# Patient Record
Sex: Female | Born: 1940
Health system: Southern US, Community
[De-identification: ages and names within clinical notes are randomized; demographics above are authoritative.]

## PROBLEM LIST (undated history)

## (undated) DIAGNOSIS — I6529 Occlusion and stenosis of unspecified carotid artery: Secondary | ICD-10-CM

## (undated) DIAGNOSIS — K5792 Diverticulitis of intestine, part unspecified, without perforation or abscess without bleeding: Secondary | ICD-10-CM

## (undated) DIAGNOSIS — R0600 Dyspnea, unspecified: Secondary | ICD-10-CM

## (undated) DIAGNOSIS — E785 Hyperlipidemia, unspecified: Secondary | ICD-10-CM

## (undated) DIAGNOSIS — M545 Low back pain, unspecified: Secondary | ICD-10-CM

## (undated) DIAGNOSIS — F410 Panic disorder [episodic paroxysmal anxiety] without agoraphobia: Secondary | ICD-10-CM

## (undated) DIAGNOSIS — D649 Anemia, unspecified: Secondary | ICD-10-CM

## (undated) DIAGNOSIS — K219 Gastro-esophageal reflux disease without esophagitis: Secondary | ICD-10-CM

## (undated) DIAGNOSIS — G8929 Other chronic pain: Secondary | ICD-10-CM

## (undated) DIAGNOSIS — R011 Cardiac murmur, unspecified: Secondary | ICD-10-CM

## (undated) DIAGNOSIS — N189 Chronic kidney disease, unspecified: Secondary | ICD-10-CM

## (undated) DIAGNOSIS — I10 Essential (primary) hypertension: Secondary | ICD-10-CM

## (undated) DIAGNOSIS — I639 Cerebral infarction, unspecified: Secondary | ICD-10-CM

## (undated) DIAGNOSIS — M1990 Unspecified osteoarthritis, unspecified site: Secondary | ICD-10-CM

## (undated) DIAGNOSIS — T4145XA Adverse effect of unspecified anesthetic, initial encounter: Secondary | ICD-10-CM

## (undated) DIAGNOSIS — J302 Other seasonal allergic rhinitis: Secondary | ICD-10-CM

## (undated) DIAGNOSIS — T8859XA Other complications of anesthesia, initial encounter: Secondary | ICD-10-CM

## (undated) DIAGNOSIS — R32 Unspecified urinary incontinence: Secondary | ICD-10-CM

## (undated) HISTORY — DX: Cerebral infarction, unspecified: I63.9

## (undated) HISTORY — DX: Anemia, unspecified: D64.9

## (undated) HISTORY — DX: Occlusion and stenosis of unspecified carotid artery: I65.29

## (undated) HISTORY — DX: Gastro-esophageal reflux disease without esophagitis: K21.9

## (undated) HISTORY — DX: Other seasonal allergic rhinitis: J30.2

## (undated) HISTORY — DX: Low back pain, unspecified: M54.50

## (undated) HISTORY — DX: Panic disorder (episodic paroxysmal anxiety): F41.0

## (undated) HISTORY — DX: Diverticulitis of intestine, part unspecified, without perforation or abscess without bleeding: K57.92

## (undated) HISTORY — DX: Other chronic pain: G89.29

## (undated) HISTORY — PX: PARTIAL HYSTERECTOMY: SHX80

## (undated) HISTORY — DX: Low back pain: M54.5

## (undated) HISTORY — DX: Cardiac murmur, unspecified: R01.1

## (undated) HISTORY — DX: Essential (primary) hypertension: I10

## (undated) HISTORY — DX: Hyperlipidemia, unspecified: E78.5

## (undated) HISTORY — PX: KNEE ARTHROSCOPY: SUR90

## (undated) HISTORY — PX: OVARIAN CYST SURGERY: SHX726

---
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
## 1971-07-12 HISTORY — PX: ABDOMINAL HYSTERECTOMY: SHX81

## 1999-12-02 ENCOUNTER — Encounter: Payer: Self-pay | Admitting: Family Medicine

## 1999-12-02 ENCOUNTER — Ambulatory Visit (HOSPITAL_COMMUNITY): Admission: RE | Admit: 1999-12-02 | Discharge: 1999-12-02 | Payer: Self-pay | Admitting: Family Medicine

## 2001-07-11 HISTORY — PX: COLECTOMY: SHX59

## 2001-11-30 ENCOUNTER — Ambulatory Visit (HOSPITAL_COMMUNITY): Admission: RE | Admit: 2001-11-30 | Discharge: 2001-11-30 | Payer: Self-pay | Admitting: Gastroenterology

## 2002-04-30 ENCOUNTER — Inpatient Hospital Stay (HOSPITAL_COMMUNITY): Admission: RE | Admit: 2002-04-30 | Discharge: 2002-05-08 | Payer: Self-pay | Admitting: *Deleted

## 2002-05-01 ENCOUNTER — Encounter: Payer: Self-pay | Admitting: Internal Medicine

## 2002-05-03 ENCOUNTER — Encounter: Payer: Self-pay | Admitting: Internal Medicine

## 2002-06-28 ENCOUNTER — Encounter: Payer: Self-pay | Admitting: General Surgery

## 2002-07-09 ENCOUNTER — Inpatient Hospital Stay (HOSPITAL_COMMUNITY): Admission: RE | Admit: 2002-07-09 | Discharge: 2002-07-16 | Payer: Self-pay | Admitting: General Surgery

## 2002-07-10 ENCOUNTER — Encounter (INDEPENDENT_AMBULATORY_CARE_PROVIDER_SITE_OTHER): Payer: Self-pay | Admitting: *Deleted

## 2005-04-12 ENCOUNTER — Ambulatory Visit: Payer: Self-pay | Admitting: General Practice

## 2005-05-14 ENCOUNTER — Ambulatory Visit (HOSPITAL_COMMUNITY): Admission: RE | Admit: 2005-05-14 | Discharge: 2005-05-14 | Payer: Self-pay | Admitting: *Deleted

## 2006-04-26 ENCOUNTER — Ambulatory Visit: Payer: Self-pay | Admitting: General Practice

## 2008-04-15 ENCOUNTER — Other Ambulatory Visit: Admission: RE | Admit: 2008-04-15 | Discharge: 2008-04-15 | Payer: Self-pay | Admitting: Family Medicine

## 2010-02-26 ENCOUNTER — Ambulatory Visit: Payer: Self-pay | Admitting: Vascular Surgery

## 2010-04-28 ENCOUNTER — Encounter: Admission: RE | Admit: 2010-04-28 | Discharge: 2010-04-28 | Payer: Self-pay | Admitting: Diagnostic Neuroimaging

## 2010-08-27 ENCOUNTER — Other Ambulatory Visit (INDEPENDENT_AMBULATORY_CARE_PROVIDER_SITE_OTHER): Payer: Medicare Other

## 2010-08-27 ENCOUNTER — Ambulatory Visit (INDEPENDENT_AMBULATORY_CARE_PROVIDER_SITE_OTHER): Payer: Medicare Other | Admitting: Vascular Surgery

## 2010-08-27 DIAGNOSIS — I6529 Occlusion and stenosis of unspecified carotid artery: Secondary | ICD-10-CM

## 2010-08-27 NOTE — Assessment & Plan Note (Signed)
OFFICE VISIT  Sophia Suarez, Sophia Suarez DOB:  Apr 05, 1941                                       08/27/2010 ZOXWR#:[id number]  This is an established patient followup.  HISTORY OF PRESENT ILLNESS:  This is a 70 year old female who presented with chief complaint of dizziness and intermittent left-sided headache. I saw her last in August of 2011.  Since then she has been seen by a neurologist and has been diagnosed with previous strokes.  There was some mention of possible benign positional vertigo.  However, no physical therapy for such has been undertaken.  At this point she continues to have vertigo both horizontal and also vertical vertigo at this point.  She denies any stroke or TIA symptomatology, specifically no amaurosis fugax, no monocular blindness, no motor weakness or facial droop or hemiplegia.  Also has had no episodes of any receptive or expressive aphasia.  Her past medical history, past surgical history, social history, medications, allergies and review of systems are all unchanged today.  PHYSICAL EXAMINATION:  Vital signs:  Her blood pressure was 142/84 in the right arm and 137/81 on the left arm, heart rate 78, respirations were 12, satting 98% on room air.  General:  Well-developed, well- nourished, no apparent distress.  HEENT:  Her head was normocephalic, atraumatic.  Pupils were equal, round, reactive to light.  Extraocular movements were intact.  The oropharynx had some mild erythema without any exudate.  Nares without any drainage.  Neck:  Supple without any nuchal rigidity.  Pulmonary:  Clear to auscultation bilaterally.  No rales, rhonchi or wheezing.  Heart:  Regular rate and rhythm.  Normal S1- S2.  No murmurs, rubs, thrills or gallops.  Vascular:  She had easily palpable pulses in the upper extremity and weakly palpable pedal pulses. Pulses can be auscultated in the carotids but there are no bruits per se.  Abdomen:  Obese, soft,  nontender, nondistended.  No guarding, no rebound, no hepatosplenomegaly.  Musculoskeletal:  Motor strength was 5/5 throughout.  No obvious cyanosis, ulcerations or any ischemic changes in any extremity.  Neuro:  Cranial nerves II-XII were intact. On eye examination there were some saccades suggestive of both horizontal and also vertical nystagmus with positional changes.  Her sensation is grossly intact.  Motor strength was equal throughout. However, I do not have the facilities to do a full Hallpike maneuver but just with positional changes she has findings that are highly suggestive of such.  Skin:  No obvious ulcers or rashes.  Lymphatics:  No cervical, axillary or inguinal lymphadenopathy.  Noninvasive vascular imaging:  She had bilateral carotid duplexes completed.  The right side the velocities were consistent with 40% to 59% stenosis, on the left 1% to 39% stenosis.  The vertebral arteries are antegrade bilaterally.  MEDICAL DECISION MAKING:  This is a 70 year old female who presents with routine followup.  She continues to have symptomatology which are highly concerning for possible benign positional vertigo.  It appears that she may not have had a full workup of such and I would suggest possible referral to a second opinion neurologist to evaluate for this as this patient even in transfers up to the examination table and also to the supine position became quite symptomatic with position changes.  I think physical therapy for BPPV would benefit her.  Additionally, in regards to her bilateral carotid stenosis  her stenoses are limited enough that she only needs annual surveillance at this point.  She needs to continue maximal medical management at this point to help arrest progression of her atherosclerotic disease.  She is already on a good regimen of aspirin, a statin and losartan for pressure control.  Due to the expertise my vascular lab has with these carotid studies I  would recommend we continue annual surveillance within our lab and also to maintain consistency in terms of the velocity criteria.  I appreciate being given the opportunity to participate in this patient's care.  Thank you very much.    Fransisco Hertz, MD Electronically Signed  BLC/MEDQ  D:  08/27/2010  T:  08/27/2010  Job:  [id number]  cc:   Emeterio Reeve, MD

## 2010-09-27 NOTE — Procedures (Unsigned)
CAROTID DUPLEX EXAM  INDICATION:  Carotid disease.  HISTORY: Diabetes:  Yes. Cardiac:  No. Hypertension:  Yes. Smoking:  No. Previous Surgery:  No. CV History:  Vertigo. Amaurosis Fugax No, Paresthesias No, Hemiparesis No                                      RIGHT             LEFT Brachial systolic pressure:         152               155 Brachial Doppler waveforms:         Normal            Normal Vertebral direction of flow:        Antegrade         Antegrade DUPLEX VELOCITIES (cm/sec) CCA peak systolic                   93                94 ECA peak systolic                   149               135 ICA peak systolic                   136               85 ICA end diastolic                   41                25 PLAQUE MORPHOLOGY:                  Heterogeneous     Heterogeneous PLAQUE AMOUNT:                      Moderate          Minimal PLAQUE LOCATION:                    CCA, ICA, bifurcation               ICA  IMPRESSION: 1. Right internal carotid artery velocity suggests 40% to 59%     stenosis. 2. Left internal carotid artery velocity suggests 1% to 39% stenosis. 3. Antegrade vertebral arteries bilaterally.  ___________________________________________ Fransisco Hertz, MD  EM/MEDQ  D:  08/27/2010  T:  08/27/2010  Job:  [id number]

## 2010-11-23 NOTE — Assessment & Plan Note (Signed)
OFFICE VISIT   Sophia Suarez, Sophia Suarez  DOB:  07-25-1940                                       02/26/2010  ZOXWR#:[id number]   This is an 70 year old female that presents with chief complaint of  dizziness and intermittent left-sided headache.  She notes onset of her  symptomatology between 4-6 months prior.  She is not exactly certain,  but since that point she has noticed increased frequency of dizziness  and no change, however, in the severity of her left temporal headaches.  There is no association necessarily between her dizziness and her  temporal headaches.  She further clarifies that by dizziness she  actually means vertigo, that is that the room appears to spin for her.  This has been increasing in severity despite use of meclizine.  She does  not have any previous TIA symptomatology, including amaurosis fugax, or  any motor or speech abnormalities or any difficulties with her  cognition.  Denies any cardiac symptoms during this time.  She is in the  process of evaluation by cardiology, I was told and possibly neurology.  She does note that her vertigo is somewhat positional and specifically  with position change.  Primarily she has, she says a left  counterclockwise vertigo but occasionally she also has clockwise vertigo  and a vertically oriented vertigo.  In the process of this patient's  workup she had a duplex ultrasound that was completed in November of  last year which at that point demonstrated a 50%-69% stenosis of the  right internal carotid artery and a less than 50% stenosis in the left  internal carotid artery.   PAST MEDICAL HISTORY:  In this patient included diabetes, seasonal  allergic rhinitis, gastroesophageal reflux disease, lumbago,  hypercholesterolemia, history of heart murmur, hypertension, asthma,  panic attacks, diverticulosis though the patient mentions  diverticulitis.   PAST SURGICAL HISTORY:  #1.  Appendectomy.  #2.  Ovarian cystectomy.  #3.  Partial hysterectomy for fibroids.  #4.  She has had some type of colectomy, the exact extent she is not  certain of for her diverticulitis.   SOCIAL HISTORY:  Notes no previous smoking.  Social alcohol use without  excessive drinking.  No illicit drug use noted.  The patient is  currently retired and has two children.   FAMILY MEDICAL HISTORY:  Mother had coronary artery disease and required  a CABG.  Father had hypertension.  Her brother had hypertension.   REVIEW OF SYSTEMS:  GEN: This patient noted weight gain.  NEUROLOGICAL:  She noted dizziness and headache.  ENT:  She had noticed some change in eyesight with the vertigo.  VASCULAR:  She noted pain in her legs with walking.  PULMONARY:  She had bronchitis and asthma.  MUSCULOSKELETAL:  She mentioned arthritis and joint pains.  CARDIAC:  She noted shortness of breath with exertion.  She also noted a  history of heart murmur on cardiac.  PSYCHIATRIC:  She mentioned anxiety.  GASTROINTESTINAL:  She mentioned reflux and diarrhea.  URINARY:  There was burning on urination and frequent urination.  The rest of the 12-point review of systems was as noted in the chart to  be negative.   MEDICATIONS:  #1. Dextrol.  #2. __________  #3. Aspirin.  #4. Multivitamin.  #5. Vitamin C.  #6. Vitamin B12.  #7. Glucosamine chondroitin.  #8. Fish  oil.  #9. Biotin.  #10. Calcium with vitamin D.  #11. Meclizine.  #12. Prilosec.  #13. Losartan.  #14. Zocor.   ALLERGIES:  SULFA WHICH SHE GETS AN ANAPHYLACTIC REACTION PER THE  PATIENT.   PHYSICAL EXAMINATION:  VITAL SIGNS Temperature 99, blood pressure  157/83, heart rate 80, respirations 12.  General:  Well-developed, well-nourished, little bit obese.  ENT:  Oropharynx was without any erythema or exudate.  There is no  palpable cervical lymphadenopathy.  Nasal septum was intact, there was  no drainage.  The ears externally were noted to be symmetric with no   obvious trauma.  Eye examination:  The pupils were equal, round and react to light.  Extraocular movements were intact.  Conjunctivae was without any  injection or scleral icterus.  Her left lower eye lid demonstrated a  tarsal cyst.  Additionally there was nystagmus and saccades with rapid  eye movement testing.  Cardiac examination:  Regular rate and rhythm.  Normal S1-S2.  No  murmurs, rubs, thrills or gallops were auscultated.  She has palpable  pulses throughout and I do not appreciate a bruit in either carotid.  Pulmonary exam:  She had symmetric expansion.  Good air movement.  No  wheezes, rhonchi or rales were noted.  GI examination:  Soft, nontender.  No obvious masses were palpable.  There is a healed surgical incision.  I did not appreciate any  hepatosplenomegaly.  There was no costovertebral angle tenderness.  I  was not able to palpate this patient's aorta due to her obesity.  Musculoskeletal examination:  Motor strength was 5/5 throughout and  symmetric.  Gait was normal.  Skin examination:  There was no obvious active rashes and her toes  demonstrated normal toes without any signs of clubbing.  Neurologic examination:  Cranial nerves II-XII were intact, symmetric  muscle strength, grossly sensation was intact in all extremities.  As  noted previously above she did have nystamus and saccades with testing  of her extraocular movements.  Additionally, she had a positive Hallpike  maneuver which induced her vertigo.  Psychiatric:  She had good judgment and her mood and affect appeared to  be normal.  Lymphatic examination:  Bilateral groins and axilla and cervical lymph  node beds demonstrated no palpable lymphadenopathy.   MEDICAL DECISION MAKING:  This patient is a 70 year old female with  predominantly a history of vertigo.  Her previous carotid duplex does  not account for her findings.  Additionally, carotid disease should not  be able to elicit her symptomatology  with maneuvers.  This would be more  suggestive of possibly a benign positional vertigo and I would recommend  that the patient proceed forward with a neurologic evaluation.  Additionally, I will repeat her bilateral internal carotid duplexes  which we will arrange as soon as possible given that approximately  greater than 6 months have elapsed since the previous carotid Dopplers.  I do not feel at this point that any surgical intervention necessarily  would help this patient.  Additional workup will be necessary to find  her true etiology, but given the presence of this stenosis I would  recommend a routine follow-up first at a 43-month interval.  If the next  carotid duplex is normal then we can space it out to 1 year.  Given the  patient's multiple comorbidities additionally optimizing her medical  management including continuation of Zocor, aspirin and dietary changes  as previously discussed with the patient.  I recommended continuing her  avoidance of cigarettes.  All of this was discussed with the patient and  she was fine with the current plan.   Thank you very much for giving [place] the opportunity to participate in the  care of this patient.     Leonides Sake, MD  Electronically Signed   BC/MEDQ  D:  02/26/2010  T:  03/01/2010  Job:  [id number]   cc:   Emeterio Reeve, MD

## 2010-11-26 NOTE — Consult Note (Signed)
NAMEFALEN, LEHRMANN                          ACCOUNT NO.:  [id number]   MEDICAL RECORD NO.:  [id number]                   PATIENT TYPE:  INP   LOCATION:  0478                                 FACILITY:  [facility]   PHYSICIAN:  Sharlet Salina T. Hoxworth, M.D.          DATE OF BIRTH:  1940-12-21   DATE OF CONSULTATION:  05/03/2002  DATE OF DISCHARGE:                                   CONSULTATION   REASON FOR CONSULTATION:  I was asked by Dr. Tresa Endo to evaluate this  patient.  She is a pleasant 70 year old white female who gives a long,  approximately 30 year, history of recurrent episodes of abdominal pain, but  had been diagnosed with diverticulitis.  These usually occur in the left  lower quadrant, and happen about four times a year.  She generally is  treated with 10 days of oral antibiotics with resolution.  She has had two  colonoscopies confirming diverticulosis, performed by Dr. Sherin Quarry.  The  last one performed in 8/03.  She had not been hospitalized previous to this  episode.  She was admitted on 03/01/02, with several days of much more severe  left lower quadrant pain associated with nausea and vomiting.  She had been  on oral antibiotics for five days without improvement.  She has now been on  IV Cipro and Flagyl for 48 hours.  She felt actually a little worse this  morning, but at this time is feeling slightly better.  She has had loose  stools over the past 36 hours.   PAST MEDICAL HISTORY:  1. Hysterectomy and appendectomy.  2. Hypertension.  3. Gastroesophageal reflux disease.  4. Elevated cholesterol.   MEDICATIONS:  1. Accupril.  2. Nexium.  3. Lipitor.  4. Fibercon.   ALLERGIES:  SULFA.   SOCIAL HISTORY:  She is divorced, employed, lives alone.   PHYSICAL EXAMINATION:  VITAL SIGNS:  She is afebrile, vital signs are all  within normal limits.  GENERAL:  She is a moderately obese white female in no acute distress.  HEENT:  Clear.  NECK:  Clear.  LUNGS:   Clear.  CARDIAC:  Regular rate and rhythm, no edema.  ABDOMEN:  A healed Pfannenstiel incision, no hernias.  There is moderately  severe well localized left lower quadrant tenderness with guarding and some  local peritonitis.  The remainder of her abdomen is soft and nontender.  No  palpable mass.  No hepatosplenomegaly palpable.   LABORATORY DATA:  White blood cell count was 14.5 on admission.  Otherwise,  unremarkable.   CT scans of the abdomen were performed on 05/01/02, and again this  afternoon.  I have reviewed both of these studies.  This shows significant  sigmoid colon diverticulitis with pericolonic inflammatory changes, and an  approximately 3 cm pericolonic abscess with fluid and air that is unchanged  between the two studies.   ASSESSMENT AND PLAN:  Acute sigmoid diverticulitis with pericolonic  abscess  on the background of a history of repeated episodes of diverticulitis  treated as an outpatient over many years.  At this point, she is stable and  there is no indication for emergency surgery.  I would expect this to  require three to four days of IV antibiotics for significant clinical  improvement.  She will need to be followed closely.  I am assuming this  episode can be resolved with antibiotics.  I would strongly consider  elective sigmoid colectomy at a later date due to a history of multiple  repeat episodes, and now a severe episode with pericolonic abscess.  Risks  for free perforation and other complications, as each are significant.  Thank you for the consult, and I will follow with you.                                                Lorne Skeens. Hoxworth, M.D.    Tory Emerald  D:  05/03/2002  T:  05/05/2002  Job:  [id number]   cc:   Tasia Catchings, M.D.  [address]  [place]  [place] [address]  Fax: [phone]   Sherin Quarry, MD  [address]  [place]  [place] [address]  Fax: [phone]

## 2010-11-26 NOTE — Discharge Summary (Signed)
NAMEFREDERICK, Sophia Suarez NO.:  [id number]   MEDICAL RECORD NO.:  [id number]                   PATIENT TYPE:  INP   LOCATION:  0478                                 FACILITY:  [facility]   PHYSICIAN:  Lazaro Arms, MD          DATE OF BIRTH:  1940-07-23   DATE OF ADMISSION:  04/30/2002  DATE OF DISCHARGE:  05/08/2002                                 DISCHARGE SUMMARY   PHYSICIANS:  1. Primary care physician is Dr. Blair Heys.  2. Consulting physician is Dr. Glenna Fellows.   DISCHARGE DIAGNOSES:  1. Diverticulitis with small diverticular abscess.  2. Gastroesophageal reflux disease.  3. Hyperlipidemia.  4. Hypertension.   BRIEF HISTORY OF PRESENT ILLNESS AND HOSPITAL COURSE:  The patient was  admitted on April 30, 2002 with left lower quadrant abdominal pain which  had been persisting for the antecedent 1-1/2 weeks.  She was started on  amoxicillin as an outpatient for treatment of presumed diverticulitis as she  had had multiple episodes before in the past and had worsening of symptoms  despite this therapy so was sent to the hospital for further evaluation and  management.  On presentation she was made n.p.o., started on IV fluids and  IV Cipro and Flagyl, an abdominal CT scan was performed which confirmed the  diagnosis of diverticulitis.  She continued to have worsening pain and a  repeat CAT scan was performed on May 03, 2002 which showed a small  diverticular abscess, surgical consultation from Dr. Johna Sheriff was obtained  at that time who recommended continued IV antibiotics and observation with  the thought that eventually she would need for surgical address.  The  patient was watched in the hospital for the next several days.  She remained  afebrile although did have some low-grade temperatures to 99, her white  count was improved and her clinical exam improved.  On the day of discharge  she was discharged in stable condition.   She had been on p.o. antibiotics  for an entire day.  She had a full dinner and breakfast that morning.  Temperature was 99.2, pulse was 70, blood pressure was 120/50.  Her exam was  within normal limits.  Her bowel sounds were positive.  Her abdomen was soft  and there was some mild tenderness in the left lower quadrant although there  was no rebound nor guarding.  Her discharge labs include a white count of  11.3, hematocrit of 35.7, and platelets of 419.  The patient did however,  still have some loose stools.  After much discussion with the patient she  wanted to go home on oral therapy and agreed to return if she had a fever  above 101 or worsening abdominal pain.   DISCHARGE PLAN:  1. The patient is to be discharged home.  She is to have a CBC and a BMET     drawn on Friday to the  attention of Dr. Manus Gunning.  2. If she begins to have a fever or worsening abdominal pain, she is to call     immediately and understands that she might have to be readmitted for IV     antibiotics at this point.  3. She is to follow up with Dr. Johna Sheriff in approximately 7-10 days.  She is     to call for her appointment.  Discharge home on oral antibiotics was     discussed with Dr. Johna Sheriff who felt this was appropriate and he wanted     to follow her up as an outpatient at which point he would decide on     definitive surgical therapy as she has had a history of recurrent     diverticulitis and might benefit from having colostomy.   DISCHARGE MEDICATIONS:  1. Protonix 40 mg p.o. q.d.  2. Lisinopril 10 mg p.o. q.d.  3. Zocor 20 mg p.o. q.d.  4. Cipro 500 mg p.o. b.i.d.  5. Flagyl 500 mg p.o. q.6h.   These antibiotics will be taken until she sees Dr. Johna Sheriff and she was also  given a prescription for Vicodin one to two tablets every 6 hours as needed  for pain.   DIET:  She was also instructed as to a diet which would be appropriate for  diverticulitis.                                                Lazaro Arms, MD    AMC/MEDQ  D:  05/08/2002  T:  05/08/2002  Job:  [id number]   cc:   Lorne Skeens. Hoxworth, M.D.  Fax: [phone]   Bryan Lemma. Manus Gunning, M.D.  [address] [place]  [place] [address]  Fax: [phone]

## 2010-11-26 NOTE — Op Note (Signed)
NAME:  MERON, BOCCHINO                        ACCOUNT NO.:  [id number]   MEDICAL RECORD NO.:  [id number]                   PATIENT TYPE:  INP   LOCATION:  0446                                 FACILITY:  [facility]   PHYSICIAN:  Sharlet Salina T. Hoxworth, M.D.          DATE OF BIRTH:  14-Jul-1940   DATE OF PROCEDURE:  07/10/2002  DATE OF DISCHARGE:                                 OPERATIVE REPORT   PREOPERATIVE DIAGNOSES:  Sigmoid diverticulitis.   POSTOPERATIVE DIAGNOSES:  Sigmoid diverticulitis.   PROCEDURE:  Sigmoid colectomy.   SURGEON:  Lorne Skeens. Hoxworth, M.D.   ASSISTANT:  Gita Kudo, M.D.   ANESTHESIA:  General.   BRIEF HISTORY:  Ms. Rabinovich is a 70 year old white female who has a many year  history of repeated episodes of lower abdominal pain with a diagnosis of  diverticulitis. These were typically in the left lower quadrant and treated  with outpatient antibiotics. She has had two colonoscopies by Dr. Sherin Quarry  which have revealed pandiverticulosis but chronic inflammatory changes in  the sigmoid colon. She was hospitalized this fall with a severe episode of  left lower quadrant abdominal pain and CT scan revealed acute sigmoid  diverticulitis with a pericolonic abscess. This was treated and clinically  resolved nonsurgically but she has continued to have recurrent episodes of  left lower quadrant pain and with history of pericolonic abscess and  confirmed repeated diverticulitis, a sigmoid colectomy has been recommended  and accepted. The nature of the procedure, its indications and risks of  bleeding, infection, anastomotic leak and cardiorespiratory complications  were discussed and understood. She is now brought to the operating room for  this procedure.   DESCRIPTION OF PROCEDURE:  The patient was brought to the operating room,  placed in supine position on the operating table and general endotracheal  anesthesia was induced. She had undergone a preoperative  mechanical and  antibiotic bowel prep. She received broad spectrum preoperative IV  antibiotics. A Foley catheter and oral gastric tube were placed. The abdomen  was sterilely prepped and draped. A low midline incision skirting the  umbilicus was used and dissection carried down through the subcutaneous  tissue to the midline fascia and peritoneum using electrocautery. The  peritoneum was entered under direct vision. A thorough exploration was  performed. There was an obvious inflammatory mass in the mid sigmoid colon  adherent to the lateral pelvic sidewall. The colon distal to this in the  pelvis and more proximally felt normal. The transverse colon, cecum and  right colon were all normal status post appendectomy. The small bowel,  retroperitoneum, liver, stomach were unremarkable. The gallbladder was  distended without palpable stones. The viscera were packed into the upper  abdomen and the sigmoid colon and pelvis well exposed. Using blunt and  cautery dissection, the inflammatory and chronic adhesions of the sigmoid  colon were dissected away from the lateral pelvic sidewall. There were  adhesions to  the left tube and ovary that were taken down with cautery.  There was a marked thickening and inflammatory mass in the mid sigmoid colon  with what appeared to be some partial obstruction and slight dilatation and  thickening in the more proximal bowel for a number of centimeters. The left  colon was fully mobilized dividing the lateral peritoneal attachments up to  the level of the splenic flexure fully mobilizing the left colon. At this  point, the ureter was identified and carefully traced along its course and  protected throughout the remainder of the dissection. The sigmoid and  rectosigmoid were fully mobilized. The patient was status post hysterectomy.  There were some adhesions to the bladder and to the left tube and ovary that  were mobilized as well and the distal sigmoid and  rectosigmoid appeared  completely normal. Areas for resection at the distal left colon and  rectosigmoid were chosen. Beginning proximally, the mesentery and pericolic  fat were cleared and the bowel was divided between the [facility] and [place]  clamps. There was a fairly large amount of liquid stool proximally that was  suctioned without any undue contamination. This did appear to be due to some  partial relative obstruction. The mesentery that involved the segment of  bowel was then sequentially divided between clamps and tied with 2-0 silk  ties working distally down to the previously identified distal resection  site of normal appearing rectosigmoid. This bowel was cleaned of pericolic  fat and mesentery, stay sutures applied distally and the bowel divided and  specimen removed. Following this, an end-to-end anastomosis was created  between the left colon and rectosigmoid with full thickness interrupted 2-0  silk sutures. There was very slight tension on the anastomosis with the  retractors in place. The bowel was well perfused and lumen appeared widely  patent. At the completion of the anastomosis with release of the retractors,  there was no tension on the anastomosis whatsoever. Gloves and instruments  were changed and the abdomen was copiously irrigated with warm saline and  hemostasis assured. The viscera returned to their anatomic position. The  midline fascia was closed with running #1 PDS beginning at either end of the  incision and tied centrally. The subcutaneous tissue was irrigated with  antibiotic solution and the skin closed with staples. Sponge, needle and  instrument counts were correct. A dry sterile dressing was applied and the  patient was taken to the recovery room in good condition.                                               Lorne Skeens. Hoxworth, M.D.    Tory Emerald  D:  07/10/2002  T:  07/10/2002  Job:  [id number]

## 2010-11-26 NOTE — Discharge Summary (Signed)
NAME:  Sophia Suarez, Sophia Suarez                        ACCOUNT NO.:  [id number]   MEDICAL RECORD NO.:  [id number]                   PATIENT TYPE:  INP   LOCATION:  0446                                 FACILITY:  [facility]   PHYSICIAN:  Sharlet Salina T. Hoxworth, M.D.          DATE OF BIRTH:  11/04/1940   DATE OF ADMISSION:  07/09/2002  DATE OF DISCHARGE:  07/16/2002                                 DISCHARGE SUMMARY   DISCHARGE DIAGNOSIS:  Sigmoid diverticulitis.   OPERATIONS/PROCEDURES:  Sigmoid colectomy, July 10, 2002, Dr. Johna Sheriff.   HISTORY OF PRESENT ILLNESS:  The patient is a 70 year old white female who  was recently hospitalized in November with an episode of severe sigmoid  diverticulitis and pericolonic abscess.  This was treated with IV  antibiotics and gradually resolved in the hospital.  She had a long history  of repeated episodes of diverticulitis in the past.  Due to recurrent and  worsening episodes, elective sigmoid colectomy was recommended and accepted.  Bowel prep at home was planned, but she had continued nausea and vomiting  with any attempt to prep bowel and was admitted on July 09, 2002, one  day prior to the procedure, for bowel prep.   PAST MEDICAL HISTORY:  She is status post hysterectomy and appendectomy.  She is treated for hypertension, GERD, and elevated cholesterol.   ADMISSION MEDICATIONS:  Accupril, Nexium, Lipitor, and FiberCon.   ALLERGIES:  SULFA ANTIBIOTICS.   PHYSICAL EXAMINATION:  VITAL SIGNS:  Afebrile.  Vital signs all within  normal limits.  ABDOMEN:  Examination revealed some very mild tenderness in the left lower  quadrant to deep palpation.   HOSPITAL COURSE:  The patient was admitted on July 09, 2002, and had an  NG tube placed for prep.  She was still unable to tolerate CoLyte but was  able to have some magnesium citrate per NG along with some of her oral  antibiotics with good results of what was felt to be an adequate prep.   She  was taken to the operating room on July 10, 2002.  Findings at surgery  were a fairly severe chronically and subacutely inflamed segment of  midsigmoid colon.  She underwent a sigmoid colectomy without complication.  She was stable postoperatively.  She did have some fever up to 101.3 on the  second postoperative day, felt to be pulmonary.  She was started on a clear-  liquid diet at that time.  On the third postoperative day her temperature  was down to 100.  She did have some lower abdominal crampy pain, and the  abdomen remained benign on exam.  She was advanced to a full-liquid diet.  A  follow-up CBC was normal on July 14, 2002, with slightly depressed  hemoglobin of 11.1 and normal white count.  By the fifth postoperative day  she had had a bowel movement and was feeling significantly better.  She was  changed back to  all her oral medications and diet advanced.  On the sixth  postoperative day she continued to do well with just occasional mild  abdominal cramps.  Abdomen was benign and wound healing primarily.  She was  discharged home at this time.   DISCHARGE MEDICATIONS:  Discharge medications are the same as admission plus  Tylenol or Tylox as needed for pain.    ACTIVITY:  Activity limitations were discussed.   FOLLOW-UP:  Follow-up is to be in the office in 10-14 days.                                               Lorne Skeens. Hoxworth, M.D.    Tory Emerald  D:  07/24/2002  T:  07/24/2002  Job:  [id number]

## 2011-08-31 ENCOUNTER — Other Ambulatory Visit (INDEPENDENT_AMBULATORY_CARE_PROVIDER_SITE_OTHER): Payer: Medicare Other | Admitting: *Deleted

## 2011-08-31 DIAGNOSIS — I6529 Occlusion and stenosis of unspecified carotid artery: Secondary | ICD-10-CM

## 2011-09-06 ENCOUNTER — Other Ambulatory Visit: Payer: Self-pay | Admitting: *Deleted

## 2011-09-06 DIAGNOSIS — I6529 Occlusion and stenosis of unspecified carotid artery: Secondary | ICD-10-CM

## 2011-09-06 NOTE — Procedures (Unsigned)
CAROTID DUPLEX EXAM  INDICATION:  Stenosis of the carotid arteries.  HISTORY: Diabetes:  Yes. Cardiac:  No. Hypertension:  Yes. Smoking:  No. Previous Surgery:  No. CV History:  Currently asymptomatic. Amaurosis Fugax No, Paresthesias No, Hemiparesis No.                                      RIGHT             LEFT Brachial systolic pressure:         149               150 Brachial Doppler waveforms:         Normal            Normal Vertebral direction of flow:        Antegrade         Antegrade DUPLEX VELOCITIES (cm/sec) CCA peak systolic                   57                85 ECA peak systolic                   126               88 ICA peak systolic                   222               76 ICA end diastolic                   72                43 PLAQUE MORPHOLOGY:                  Heterogenous      Heterogenous PLAQUE AMOUNT:                      Moderate          Minimal PLAQUE LOCATION:                    CCA, ICA, bifurcation               ICA  IMPRESSION: 1. Right internal carotid artery velocities suggest 60% to 79%     stenosis, which is an increase in comparison to the previous     examination. 2. Left internal carotid artery velocities suggest 1% to 39% stenosis. 3. Antegrade vertebral arteries bilaterally.  ___________________________________________ Fransisco Hertz, MD  EM/MEDQ  D:  08/31/2011  T:  08/31/2011  Job:  [id number]

## 2011-09-07 ENCOUNTER — Encounter: Payer: Self-pay | Admitting: Vascular Surgery

## 2011-11-04 ENCOUNTER — Other Ambulatory Visit: Payer: Self-pay | Admitting: Diagnostic Neuroimaging

## 2011-11-04 DIAGNOSIS — G459 Transient cerebral ischemic attack, unspecified: Secondary | ICD-10-CM

## 2011-11-04 DIAGNOSIS — I6529 Occlusion and stenosis of unspecified carotid artery: Secondary | ICD-10-CM

## 2011-11-04 LAB — CREATININE, SERUM: Creat: 0.71 mg/dL (ref 0.50–1.10)

## 2011-11-09 ENCOUNTER — Ambulatory Visit
Admission: RE | Admit: 2011-11-09 | Discharge: 2011-11-09 | Disposition: A | Discharge status: Home / Self Care | Payer: Medicare Other | Source: Ambulatory Visit | Attending: Diagnostic Neuroimaging | Admitting: Diagnostic Neuroimaging

## 2011-11-09 ENCOUNTER — Other Ambulatory Visit (HOSPITAL_COMMUNITY): Payer: Self-pay | Admitting: Diagnostic Neuroimaging

## 2011-11-09 DIAGNOSIS — G459 Transient cerebral ischemic attack, unspecified: Secondary | ICD-10-CM

## 2011-11-09 DIAGNOSIS — I6529 Occlusion and stenosis of unspecified carotid artery: Secondary | ICD-10-CM

## 2011-11-09 MED ORDER — IOHEXOL 350 MG/ML SOLN
100.0000 mL | Freq: Once | INTRAVENOUS | Status: AC | PRN
  Administered 2011-11-09: 100 mL via INTRAVENOUS

## 2011-11-10 ENCOUNTER — Other Ambulatory Visit: Payer: Self-pay

## 2011-11-10 ENCOUNTER — Ambulatory Visit (HOSPITAL_COMMUNITY): Payer: Medicare Other | Attending: Cardiology

## 2011-11-10 DIAGNOSIS — E119 Type 2 diabetes mellitus without complications: Secondary | ICD-10-CM | POA: Insufficient documentation

## 2011-11-10 DIAGNOSIS — R42 Dizziness and giddiness: Secondary | ICD-10-CM | POA: Insufficient documentation

## 2011-11-10 DIAGNOSIS — E785 Hyperlipidemia, unspecified: Secondary | ICD-10-CM | POA: Insufficient documentation

## 2011-11-10 DIAGNOSIS — R209 Unspecified disturbances of skin sensation: Secondary | ICD-10-CM | POA: Insufficient documentation

## 2011-11-10 DIAGNOSIS — E669 Obesity, unspecified: Secondary | ICD-10-CM | POA: Insufficient documentation

## 2011-11-10 DIAGNOSIS — G459 Transient cerebral ischemic attack, unspecified: Secondary | ICD-10-CM | POA: Insufficient documentation

## 2011-11-10 DIAGNOSIS — I10 Essential (primary) hypertension: Secondary | ICD-10-CM | POA: Insufficient documentation

## 2011-11-10 DIAGNOSIS — I079 Rheumatic tricuspid valve disease, unspecified: Secondary | ICD-10-CM | POA: Insufficient documentation

## 2011-11-10 DIAGNOSIS — R609 Edema, unspecified: Secondary | ICD-10-CM | POA: Insufficient documentation

## 2011-11-11 ENCOUNTER — Encounter: Payer: Self-pay | Admitting: Vascular Surgery

## 2011-11-11 ENCOUNTER — Encounter (HOSPITAL_COMMUNITY): Payer: Self-pay | Admitting: Diagnostic Neuroimaging

## 2011-11-17 ENCOUNTER — Encounter: Payer: Self-pay | Admitting: Vascular Surgery

## 2011-11-18 ENCOUNTER — Ambulatory Visit (INDEPENDENT_AMBULATORY_CARE_PROVIDER_SITE_OTHER): Payer: Medicare Other | Admitting: Vascular Surgery

## 2011-11-18 ENCOUNTER — Encounter: Payer: Self-pay | Admitting: Vascular Surgery

## 2011-11-18 ENCOUNTER — Telehealth: Payer: Self-pay | Admitting: Vascular Surgery

## 2011-11-18 ENCOUNTER — Ambulatory Visit: Payer: Medicare Other | Admitting: Vascular Surgery

## 2011-11-18 VITALS — BP 179/94 | HR 83 | Temp 98.3°F | Ht 60.0 in | Wt 183.0 lb

## 2011-11-18 DIAGNOSIS — Z01810 Encounter for preprocedural cardiovascular examination: Secondary | ICD-10-CM

## 2011-11-18 DIAGNOSIS — I6529 Occlusion and stenosis of unspecified carotid artery: Secondary | ICD-10-CM | POA: Insufficient documentation

## 2011-11-18 DIAGNOSIS — Z01818 Encounter for other preprocedural examination: Secondary | ICD-10-CM

## 2011-11-18 DIAGNOSIS — I6521 Occlusion and stenosis of right carotid artery: Secondary | ICD-10-CM | POA: Insufficient documentation

## 2011-11-18 NOTE — Progress Notes (Signed)
Addended by: Sharee Pimple on: 11/18/2011 02:33 PM   Modules accepted: Orders

## 2011-11-18 NOTE — Telephone Encounter (Signed)
i spoke with patient regarding a cardiology referral to [place] with dr.nishan on 11/22/11 at 10:45am. She is aware of this appt and knows to arrive 15 mins prior. Jacklyn Shell

## 2011-11-18 NOTE — Progress Notes (Signed)
[facility] [place]  Established Carotid Patient  History of Present Illness  JOSEPHENE MARRONE is a 71 y.o. (11-18-1940) female who presents with chief complaint: episodes of left face numbness and left arm numbness.  This patient was recently re-referred to me by her Neurologist with possible right hemispheric sx.  She is now having sharp left temporal headaches followed by left facial numbness with episodic facial drooping and left arm weakness and numbness.  At times, the numbness has extended down to the left knee.  Previous carotid studies demonstrated: RICA 50-69% stenosis, LICA 1-39% stenosis.  Patient has no prior history of TIA or stroke symptom.  The patient has never had amaurosis fugax or monocular blindness.  The patient has had facial drooping as described without hemiplegia.  The patient has never had receptive or expressive aphasia.  The patient's sx are resolved currently.  She just had her Head MRI yesterday and the results are not available yet.  Past Medical History, Past Surgical History, Social History, Family History, Medications, Allergies, and Review of Systems are unchanged from previous evaluation on 08/27/10 except for ROS: notable for facial numbness, intermittent left arm weakness and numbness, and headache.  Physical Examination  Filed Vitals:   11/18/11 1003 11/18/11 1006  BP: 173/82 179/94  Pulse: 85 83  Temp: 98.3 F (36.8 C)   TempSrc: Oral   Height: 5' (1.524 m)   Weight: 183 lb (83.008 kg)    Body mass index is 35.74 kg/(m^2).  General: A&O x 3, WDWN, mildly obese  Eyes: PERRLA, EOMI  Pulmonary: Sym exp, good air movt, CTAB, no rales, rhonchi, & wheezing  Cardiac: RRR, Nl S1, S2, no Murmurs, rubs or gallops  Vascular: Vessel Right Left  Radial Palpable Palpable  Brachial Palpable Palpable  Carotid Palpable, without bruit Palpable, without bruit  Aorta Non-palpable N/A  Femoral Palpable Palpable  Popliteal Non-palpable  Non-palpable  PT Weakly Palpable Weakly Palpable  DP Weakly Palpable Weakly Palpable   Gastrointestinal: soft, NTND, -G/R, - HSM, - masses, - CVAT B  Musculoskeletal: M/S 5/5 throughout , Extremities without ischemic changes   Neurologic: CN 2-12 intact , Pain and light touch intact in extremities , Motor exam as listed above  CTA Neck (11/09/11) 1. Normal left cervical carotid artery. Mild bilateral vertebral  artery origin atherosclerosis.  2. Soft and calcified plaque extends from the right ICA origin 25  mm distally and results in up to 55-60 % stenosis with respect to  the distal vessel.  3. See intracranial findings below.  Based on my read of the CTA Neck, patient has a clean LICA and RICA stenosis <80% with extensive calcification.  The lesion appears to be low and is well below the mandibular angle.  Medical Decision Making  ARLANDA SHIPLETT is a 71 y.o. female who presents with: R ICA stenosis with atypical right hemispheric symptoms.   Her sx are not consistent with the classic pattern with ICA disease, so I am not certain if she would benefit from a CEA.  If the MRI demonstrates R CVA, NASCET would provide justification for proceeding.   If her neurologist is convinced she is experiencing TIAs, I think it would be justifiable to proceed.  But I suspect he is awaiting the MRI result also.  As a precaution, I am going to see if she can be cleared from a cardiac standpoint for proceeding with CEA.  This patient is likely also a candidate for carotid stenting, but without  significant cardiac co-morbidities I don't think the increased stroke risk is justified.  I discussed in depth with the patient the nature of atherosclerosis, and emphasized the importance of maximal medical management including strict control of blood pressure, blood glucose, and lipid levels, antiplatelet agents, obtaining regular exercise, and cessation of smoking.  The patient is aware that without maximal  medical management the underlying atherosclerotic disease process will progress, limiting the benefit of any interventions.  The patient is going to follow up with me in two week once the cardiology opinion, neurology opinion, and MRI results are all available to make a final decision.  Thank you for allowing [place] to participate in this patient's care.  Leonides Sake, MD [facility] [place] Office: [phone] Pager: [phone]  11/18/2011, 10:34 AM

## 2011-11-22 ENCOUNTER — Encounter: Payer: Self-pay | Admitting: Cardiovascular Disease

## 2011-11-22 ENCOUNTER — Ambulatory Visit (INDEPENDENT_AMBULATORY_CARE_PROVIDER_SITE_OTHER): Payer: Medicare Other | Admitting: Cardiovascular Disease

## 2011-11-22 VITALS — BP 171/81 | HR 83 | Wt 181.0 lb

## 2011-11-22 DIAGNOSIS — J302 Other seasonal allergic rhinitis: Secondary | ICD-10-CM | POA: Insufficient documentation

## 2011-11-22 DIAGNOSIS — I6529 Occlusion and stenosis of unspecified carotid artery: Secondary | ICD-10-CM

## 2011-11-22 DIAGNOSIS — I639 Cerebral infarction, unspecified: Secondary | ICD-10-CM | POA: Insufficient documentation

## 2011-11-22 DIAGNOSIS — G8929 Other chronic pain: Secondary | ICD-10-CM | POA: Insufficient documentation

## 2011-11-22 DIAGNOSIS — R011 Cardiac murmur, unspecified: Secondary | ICD-10-CM

## 2011-11-22 DIAGNOSIS — K5792 Diverticulitis of intestine, part unspecified, without perforation or abscess without bleeding: Secondary | ICD-10-CM

## 2011-11-22 DIAGNOSIS — K5732 Diverticulitis of large intestine without perforation or abscess without bleeding: Secondary | ICD-10-CM

## 2011-11-22 DIAGNOSIS — I6521 Occlusion and stenosis of right carotid artery: Secondary | ICD-10-CM

## 2011-11-22 DIAGNOSIS — I6350 Cerebral infarction due to unspecified occlusion or stenosis of unspecified cerebral artery: Secondary | ICD-10-CM

## 2011-11-22 DIAGNOSIS — E785 Hyperlipidemia, unspecified: Secondary | ICD-10-CM

## 2011-11-22 DIAGNOSIS — I10 Essential (primary) hypertension: Secondary | ICD-10-CM

## 2011-11-22 DIAGNOSIS — J309 Allergic rhinitis, unspecified: Secondary | ICD-10-CM

## 2011-11-22 DIAGNOSIS — F410 Panic disorder [episodic paroxysmal anxiety] without agoraphobia: Secondary | ICD-10-CM

## 2011-11-22 DIAGNOSIS — M545 Low back pain: Secondary | ICD-10-CM

## 2011-11-22 DIAGNOSIS — K219 Gastro-esophageal reflux disease without esophagitis: Secondary | ICD-10-CM | POA: Insufficient documentation

## 2011-11-22 NOTE — Assessment & Plan Note (Signed)
F/U Dr Imogene Burn next week.  Apparently if MRI shows defects he may operate despite lack of critical lesion.  Clear to have surgery from cardiac perspective

## 2011-11-22 NOTE — Patient Instructions (Signed)
Your physician wants you to follow-up in: YEAR WITH DR NISHAN  You will receive a reminder letter in the mail two months in advance. If you don't receive a letter, please call our office to schedule the follow-up appointment.  Your physician recommends that you continue on your current medications as directed. Please refer to the Current Medication list given to you today. 

## 2011-11-22 NOTE — Assessment & Plan Note (Signed)
Very mild AS  F/U echo in 2 years

## 2011-11-22 NOTE — Assessment & Plan Note (Signed)
Cholesterol is at goal.  Continue current dose of statin and diet Rx.  No myalgias or side effects.  F/U  LFT's in 6 months. No results found for this basename: LDLCALC             

## 2011-11-22 NOTE — Progress Notes (Signed)
Patient ID: Sophia Suarez, female   DOB: August 14, 1940, 71 y.o.   MRN: [id number] 71 yo referred by Dr Imogene Burn for possible pre-surgical clearence.  Has had TIAls with borderline carotid lesion.  Sees Dr Marjory Lies  as well.  Reviewed CTA from 4/26 and RICA has a ? Of 55-60% stenosis not typically bad enough to operate on.  Had a F/U MRI last Thursday Results not available.  Over last 6 months has had multople episodes of left sided paresthesia Not clearly TIA;s and ? Migraine, vascular headaches or neuralgia  No history of CAD.  Active with no chest pain.  Long standing murmur.  Echo done 11/10/11 reviewed  Study Conclusions  - Left ventricle: The cavity size was normal. Wall thickness was increased in a pattern of mild LVH. Systolic function was vigorous. The estimated ejection fraction was in the range of 65% to 70%. Wall motion was normal; there were no regional wall motion abnormalities. Doppler parameters are consistent with abnormal left ventricular relaxation (grade 1 diastolic dysfunction). - Aortic valve: There was very mild stenosis. Mean gradient: 9mm Hg (S). Peak gradient: 14mm Hg (S). - Atrial septum: No defect or patent foramen ovale was identified.  CRF; Type 2 DM, elevated lipids and HTN all well Rx  ROS: Denies fever, malais, weight loss, blurry vision, decreased visual acuity, cough, sputum, SOB, hemoptysis, pleuritic pain, palpitaitons, heartburn, abdominal pain, melena, lower extremity edema, claudication, or rash.  All other systems reviewed and negative   General: Affect appropriate Healthy:  appears stated age HEENT: normal Neck supple with no adenopathy JVP normal no bruits no thyromegaly Lungs clear with no wheezing and good diaphragmatic motion Heart:  S1/S2 mild AS  Murmur no ,rub, gallop or click PMI normal Abdomen: benighn, BS positve, no tenderness, no AAA no bruit.  No HSM or HJR Distal pulses intact with no bruits No edema Neuro non-focal Skin warm and  dry No muscular weakness  Medications Current Outpatient Prescriptions  Medication Sig Dispense Refill  . albuterol (PROAIR HFA) 108 (90 BASE) MCG/ACT inhaler Inhale 2 puffs into the lungs every 6 (six) hours as needed.      . clopidogrel (PLAVIX) 75 MG tablet Take 75 mg by mouth daily.      . Cyanocobalamin (VITAMIN B-12 PO) Take by mouth daily.      . diphenhydrAMINE (BENADRYL) 50 MG capsule Take 50 mg by mouth every 6 (six) hours as needed. Pt states she takes 2 tabs at night and 2 tabs in the morning and maybe one tab in the evening      . fish oil-omega-3 fatty acids 1000 MG capsule Take 2 g by mouth 2 (two) times daily.       . Glucosamine-Chondroitin (GLUCOSAMINE CHONDR COMPLEX) 500-400 MG CAPS Take by mouth 2 (two) times daily. Pt states on rainy days she takes 3 tablets and on regular days she takes 2 tablets      . losartan (COZAAR) 100 MG tablet Take 100 mg by mouth daily.       . meclizine (ANTIVERT) 25 MG tablet Take 25 mg by mouth as needed.      . meloxicam (MOBIC) 15 MG tablet Take 15 mg by mouth daily.      . Multiple Vitamin (MULTIVITAMIN) capsule Take 1 capsule by mouth daily.      Marland Kitchen omeprazole (PRILOSEC) 20 MG capsule Take 20 mg by mouth 2 (two) times daily.       . simvastatin (ZOCOR) 40 MG tablet Take  40 mg by mouth every evening.        Allergies Clindamycin/lincomycin; Septra; Sulfa antibiotics; Mold extract; and Pollen extract  Family History: Family History  Problem Relation Age of Onset  . Aneurysm Mother     aortic  . Coronary artery disease Mother     CABG  . Cancer Father     Brain  . Hypertension Father   . Hypertension Brother     Social History: History   Social History  . Marital Status: Divorced    Spouse Name: N/A    Number of Children: N/A  . Years of Education: N/A   Occupational History  . Not on file.   Social History Main Topics  . Smoking status: Never Smoker   . Smokeless tobacco: Not on file  . Alcohol Use: 0.0 - 0.5  oz/week    0-1 drink(s) per week  . Drug Use: No  . Sexually Active: Not on file   Other Topics Concern  . Not on file   Social History Narrative  . No narrative on file    Electrocardiogram:  NSR rate 83  RAD otherwise normal  Assessment and Plan

## 2011-11-22 NOTE — Assessment & Plan Note (Signed)
Well controlled.  Continue current medications and low sodium Dash type diet.    

## 2011-12-01 ENCOUNTER — Encounter: Payer: Self-pay | Admitting: Vascular Surgery

## 2011-12-02 ENCOUNTER — Encounter: Payer: Self-pay | Admitting: Vascular Surgery

## 2011-12-02 ENCOUNTER — Ambulatory Visit (INDEPENDENT_AMBULATORY_CARE_PROVIDER_SITE_OTHER): Payer: Medicare Other | Admitting: Vascular Surgery

## 2011-12-02 VITALS — BP 156/88 | HR 72 | Temp 98.0°F | Ht 60.0 in | Wt 180.0 lb

## 2011-12-02 DIAGNOSIS — I658 Occlusion and stenosis of other precerebral arteries: Secondary | ICD-10-CM

## 2011-12-02 DIAGNOSIS — I6523 Occlusion and stenosis of bilateral carotid arteries: Secondary | ICD-10-CM | POA: Insufficient documentation

## 2011-12-02 NOTE — Progress Notes (Signed)
[facility] [place]  Established Carotid Patient  History of Present Illness  Sophia Suarez is a 71 y.o. (10-13-1940) female who presents with chief complaint: left facial paraesthesia.  Patient notes persistent left facial paraesthesias without any motor sx currently.  She has undergone her MRI which did not demonstrate any frank CVA.    She also has been cleared by her cardiologist.    Past Medical History, Past Surgical History, Social History, Family History, Medications, Allergies, and Review of Systems are unchanged from previous evaluation on 11/18/11.  Physical Examination  Filed Vitals:   12/02/11 0932 12/02/11 0938  BP: 153/104 156/88  Pulse: 74 72  Temp: 98 F (36.7 C)   TempSrc: Oral   Height: 5' (1.524 m)   Weight: 180 lb (81.647 kg)    Body mass index is 35.15 kg/(m^2).  General: A&O x 3, WDWN, mildly obese  Eyes: PERRLA, EOMI  Pulmonary: Sym exp, good air movt, CTAB, no rales, rhonchi, & wheezing  Cardiac: RRR, Nl S1, S2, no Murmurs, rubs or gallops  Gastrointestinal: soft, NTND, -G/R, - HSM, - masses, - CVAT B  Musculoskeletal: M/S 5/5 throughout , Extremities without ischemic changes   Neurologic: CN 2-12 intact , Pain and light touch intact in extremities , Motor exam as listed above  Medical Decision Making  Sophia Suarez is a 71 y.o. female who presents with: R ICA stenosis <70%, neurologic sx not c/w classic TIA or CVA   At this point, with non-classical sx and a negative MRI, I don't think it is prudent to proceed with a R CEA.  I would emphasize MMT and have the patient follow up with her neurologist for further workup.  I would repeated her carotid studies in 6 months and see her again.  I discussed in depth with the patient the nature of atherosclerosis, and emphasized the importance of maximal medical management including strict control of blood pressure, blood glucose, and lipid levels, antiplatelet agents, obtaining  regular exercise, and cessation of smoking.  The patient is aware that without maximal medical management the underlying atherosclerotic disease process will progress, limiting the benefit of any interventions.  Thank you for allowing [place] to participate in this patient's care.  Leonides Sake, MD [facility] [place] Office: [phone] Pager: [phone]  12/02/2011, 11:23 AM

## 2011-12-13 NOTE — Progress Notes (Signed)
Addended by: Reine Just on: 12/13/2011 07:12 PM   Modules accepted: Orders

## 2012-02-24 ENCOUNTER — Other Ambulatory Visit: Payer: Medicare Other

## 2012-02-24 ENCOUNTER — Ambulatory Visit: Payer: Medicare Other | Admitting: Vascular Surgery

## 2012-06-08 ENCOUNTER — Ambulatory Visit: Payer: Medicare Other | Admitting: Neurosurgery

## 2012-06-08 ENCOUNTER — Other Ambulatory Visit: Payer: Medicare Other

## 2012-06-14 ENCOUNTER — Encounter: Payer: Self-pay | Admitting: Neurosurgery

## 2012-06-15 ENCOUNTER — Other Ambulatory Visit (INDEPENDENT_AMBULATORY_CARE_PROVIDER_SITE_OTHER): Payer: Medicare Other | Admitting: *Deleted

## 2012-06-15 ENCOUNTER — Ambulatory Visit (INDEPENDENT_AMBULATORY_CARE_PROVIDER_SITE_OTHER): Payer: Medicare Other | Admitting: Neurosurgery

## 2012-06-15 ENCOUNTER — Encounter: Payer: Self-pay | Admitting: Neurosurgery

## 2012-06-15 VITALS — BP 165/80 | HR 69 | Resp 16 | Ht 60.0 in | Wt 170.0 lb

## 2012-06-15 DIAGNOSIS — I6529 Occlusion and stenosis of unspecified carotid artery: Secondary | ICD-10-CM

## 2012-06-15 DIAGNOSIS — I6523 Occlusion and stenosis of bilateral carotid arteries: Secondary | ICD-10-CM

## 2012-06-15 NOTE — Addendum Note (Signed)
Addended by: Sharee Pimple on: 06/15/2012 12:56 PM   Modules accepted: Orders

## 2012-06-15 NOTE — Progress Notes (Signed)
[facility] Carotid Office Note  CC: Carotid surveillance Referring Physician: Imogene Burn  History of Present Illness: 71 year old female patient of Dr. Imogene Burn with no history of carotid intervention to the patient denies any signs or symptoms of CVA, TIA, amaurosis fugax or any neural deficit. The patient denies any new medical diagnoses or recent surgery.  Past Medical History  Diagnosis Date  . Hypertension   . Diabetes mellitus   . Hyperlipidemia   . Carotid artery occlusion   . GERD (gastroesophageal reflux disease)   . Asthma   . Diverticulitis   . Chronic pain   . Lumbago   . Heart murmur   . Panic attacks   . Stroke   . Allergic rhinitis, seasonal   . Anemia     ROS: [x]  Positive   [ ]  Denies    General: [ ]  Weight loss, [ ]  Fever, [ ]  chills Neurologic: [ ]  Dizziness, [ ]  Blackouts, [ ]  Seizure [ ]  Stroke, [ ]  "Mini stroke", [ ]  Slurred speech, [ ]  Temporary blindness; [ ]  weakness in arms or legs, [ ]  Hoarseness Cardiac: [ ]  Chest pain/pressure, [ ]  Shortness of breath at rest [ ]  Shortness of breath with exertion, [ ]  Atrial fibrillation or irregular heartbeat Vascular: [ ]  Pain in legs with walking, [ ]  Pain in legs at rest, [ ]  Pain in legs at night,  [ ]  Non-healing ulcer, [ ]  Blood clot in vein/DVT,   Pulmonary: [ ]  Home oxygen, [ ]  Productive cough, [ ]  Coughing up blood, [ ]  Asthma,  [ ]  Wheezing Musculoskeletal:  [ ]  Arthritis, [ ]  Low back pain, [ ]  Joint pain Hematologic: [ ]  Easy Bruising, [ ]  Anemia; [ ]  Hepatitis Gastrointestinal: [ ]  Blood in stool, [ ]  Gastroesophageal Reflux/heartburn, [ ]  Trouble swallowing Urinary: [ ]  chronic Kidney disease, [ ]  on HD - [ ]  MWF or [ ]  TTHS, [ ]  Burning with urination, [ ]  Difficulty urinating Skin: [ ]  Rashes, [ ]  Wounds Psychological: [ ]  Anxiety, [ ]  Depression   Social History History  Substance Use Topics  . Smoking status: Never Smoker   . Smokeless tobacco: Not on file  .  Alcohol Use: 0.0 - 0.5 oz/week    0-1 drink(s) per week    Family History Family History  Problem Relation Age of Onset  . Aneurysm Mother     aortic  . Coronary artery disease Mother     CABG  . Cancer Mother   . Diabetes Mother   . Heart disease Mother     AAA  . Hypertension Mother   . Cancer Father     Brain  . Hypertension Father   . Deep vein thrombosis Father   . Hypertension Brother   . Hypertension Son     Allergies  Allergen Reactions  . Clindamycin/Lincomycin Anaphylaxis  . Septra (Sulfamethoxazole-Tmp Ds) Anaphylaxis  . Sulfa Antibiotics Anaphylaxis  . Mold Extract (Trichophyton)   . Pollen Extract     Current Outpatient Prescriptions  Medication Sig Dispense Refill  . Glucosamine-Chondroitin (GLUCOSAMINE CHONDR COMPLEX) 500-400 MG CAPS Take 1,000 mg by mouth 2 (two) times daily. Pt states on rainy days she takes 3 tablets and on regular days she takes 2 tablets      . guaiFENesin (MUCINEX) 600 MG 12 hr tablet Take 1,200 mg by mouth 2 (two) times daily.      Marland Kitchen albuterol (PROAIR HFA) 108 (90 BASE) MCG/ACT inhaler Inhale  2 puffs into the lungs every 6 (six) hours as needed.      . clopidogrel (PLAVIX) 75 MG tablet Take 75 mg by mouth daily.      . Cyanocobalamin (VITAMIN B-12 PO) Take by mouth daily.      . diphenhydrAMINE (BENADRYL) 50 MG capsule Take 50 mg by mouth every 6 (six) hours as needed. Pt states she takes 2 tabs at night and 2 tabs in the morning and maybe one tab in the evening      . fish oil-omega-3 fatty acids 1000 MG capsule Take 2 g by mouth 2 (two) times daily.       Marland Kitchen losartan (COZAAR) 100 MG tablet Take 100 mg by mouth daily.       . meclizine (ANTIVERT) 25 MG tablet Take 25 mg by mouth as needed.      . meloxicam (MOBIC) 15 MG tablet Take 15 mg by mouth daily.      . Multiple Vitamin (MULTIVITAMIN) capsule Take 1 capsule by mouth daily.      Marland Kitchen omeprazole (PRILOSEC) 20 MG capsule Take 20 mg by mouth 2 (two) times daily.       . simvastatin  (ZOCOR) 40 MG tablet Take 40 mg by mouth every evening.      . Undecylenic Ac-Zn Undecylenate (FUNGI-NAIL TOE & FOOT EX) Apply topically 2 (two) times daily.        Physical Examination  Filed Vitals:   06/15/12 1149  BP: 165/80  Pulse: 69  Resp: 16    Body mass index is 33.20 kg/(m^2).  General:  WDWN in NAD Gait: Normal HEENT: WNL Eyes: Pupils equal Pulmonary: normal non-labored breathing , without Rales, rhonchi,  wheezing Cardiac: RRR, without  Murmurs, rubs or gallops; Abdomen: soft, NT, no masses Skin: no rashes, ulcers noted  Vascular Exam Pulses: 2+ radial pulses bilaterally Carotid bruits: Carotid pulses to auscultation no bruits are heard Extremities without ischemic changes, no Gangrene , no cellulitis; no open wounds;  Musculoskeletal: no muscle wasting or atrophy   Neurologic: A&O X 3; Appropriate Affect ; SENSATION: normal; MOTOR FUNCTION:  moving all extremities equally. Speech is fluent/normal  Non-Invasive Vascular Imaging CAROTID DUPLEX 06/15/2012  Right ICA 40 - 59 % stenosis Left ICA 20 - 39 % stenosis   ASSESSMENT/PLAN: Asymptomatic patient with unchanged carotid duplex from previous exam. The patient will followup in one year with repeat carotid duplex, her questions were encouraged and answered, she is in agreement with this plan.  Lauree Chandler ANP   Clinic MD: Imogene Burn

## 2012-11-12 ENCOUNTER — Other Ambulatory Visit (HOSPITAL_COMMUNITY): Payer: Self-pay | Admitting: Diagnostic Neuroimaging

## 2012-11-22 ENCOUNTER — Ambulatory Visit (INDEPENDENT_AMBULATORY_CARE_PROVIDER_SITE_OTHER): Payer: Medicare Other | Admitting: Cardiovascular Disease

## 2012-11-22 VITALS — BP 169/85 | HR 69 | Wt 176.0 lb

## 2012-11-22 DIAGNOSIS — I10 Essential (primary) hypertension: Secondary | ICD-10-CM

## 2012-11-22 DIAGNOSIS — I6529 Occlusion and stenosis of unspecified carotid artery: Secondary | ICD-10-CM

## 2012-11-22 DIAGNOSIS — R609 Edema, unspecified: Secondary | ICD-10-CM

## 2012-11-22 DIAGNOSIS — Z79899 Other long term (current) drug therapy: Secondary | ICD-10-CM

## 2012-11-22 DIAGNOSIS — R011 Cardiac murmur, unspecified: Secondary | ICD-10-CM

## 2012-11-22 MED ORDER — ETHACRYNIC ACID 25 MG PO TABS: 25.0000 mg | ORAL_TABLET | Freq: Every day | ORAL | Status: DC

## 2012-11-22 NOTE — Patient Instructions (Addendum)
Your physician wants you to follow-up in:   6 MONTHS WITH DR Haywood Filler will receive a reminder letter in the mail two months in advance. If you don't receive a letter, please call our office to schedule the follow-up appointment. Your physician has recommended you make the following change in your medication: ADD  EDECRIN 25 MG 1 EVERY AM  Your physician recommends that you return for lab work in: 3-4 WEEKS  BMET  DX V58.69

## 2012-11-22 NOTE — Addendum Note (Signed)
Addended by: Scherrie Bateman E on: 11/22/2012 09:32 AM   Modules accepted: Orders

## 2012-11-22 NOTE — Assessment & Plan Note (Signed)
Add edecrin as diuretic for both edema and BP control  Despite sulfa allergy she has been on thiazide and lasix based diuretics many years agao but pharmacy wont fill them now

## 2012-11-22 NOTE — Assessment & Plan Note (Signed)
F/U Dr Imogene Burn moderate carotid disease Duplex in 6 months

## 2012-11-22 NOTE — Assessment & Plan Note (Signed)
Mild AS F/U echo in a year Asymptomatic

## 2012-11-22 NOTE — Progress Notes (Signed)
Patient ID: Sophia Suarez, female   DOB: [birth year], 72 y.o.   MRN: [id number] 72 yo referred by Dr Imogene Burn for possible pre-surgical clearence. Has had TIAls with borderline carotid lesion. Sees Dr Marjory Lies as well. Reviewed CTA from 4/26 and RICA has a ? Of 55-60% stenosis not typically bad enough to operate on. Had a F/U MRI last Thursday Results not available. Over last 6 months has had multople episodes of left sided paresthesia Not clearly TIA;s and ? Migraine, vascular headaches or neuralgia No history of CAD. Active with no chest pain. Long standing murmur. Echo done 11/10/11 reviewed  Study Conclusions  - Left ventricle: The cavity size was normal. Wall thickness was increased in a pattern of mild LVH. Systolic function was vigorous. The estimated ejection fraction was in the range of 65% to 70%. Wall motion was normal; there were no regional wall motion abnormalities. Doppler parameters are consistent with abnormal left ventricular relaxation (grade 1 diastolic dysfunction). - Aortic valve: There was very mild stenosis. Mean gradient: 9mm Hg (S). Peak gradient: 14mm Hg (S). - Atrial septum: No defect or patent foramen ovale was identified.  CRF; Type 2 DM, elevated lipids and HTN all well Rx  Has edema and BP not ideally controlled Having trouble sleeping Cannot take sulfa based diuretics and melatonin   ROS: Denies fever, malais, weight loss, blurry vision, decreased visual acuity, cough, sputum, SOB, hemoptysis, pleuritic pain, palpitaitons, heartburn, abdominal pain, melena, lower extremity edema, claudication, or rash.  All other systems reviewed and negative  General: Affect appropriate Healthy:  appears stated age HEENT: normal Neck supple with no adenopathy JVP normal no bruits no thyromegaly Lungs clear with no wheezing and good diaphragmatic motion Heart:  S1/S2 mild AS murmur, no rub, gallop or click PMI normal Abdomen: benighn, BS positve, no tenderness, no AAA no  bruit.  No HSM or HJR Distal pulses intact with no bruits Plus one bilateral  edema Neuro non-focal Skin warm and dry No muscular weakness   Current Outpatient Prescriptions  Medication Sig Dispense Refill  . albuterol (PROAIR HFA) 108 (90 BASE) MCG/ACT inhaler Inhale 2 puffs into the lungs every 6 (six) hours as needed.      . clopidogrel (PLAVIX) 75 MG tablet TAKE 1 TABLET BY MOUTH EVERY DAY  30 tablet  0  . Cyanocobalamin (VITAMIN B-12 PO) Take by mouth daily.      . diphenhydrAMINE (BENADRYL) 50 MG capsule Take 50 mg by mouth every 6 (six) hours as needed. Pt states she takes 2 tabs at night and 2 tabs in the morning and maybe one tab in the evening      . fish oil-omega-3 fatty acids 1000 MG capsule Take 2 g by mouth 2 (two) times daily.       . Glucosamine-Chondroitin (GLUCOSAMINE CHONDR COMPLEX) 500-400 MG CAPS Take 1,000 mg by mouth 2 (two) times daily. Pt states on rainy days she takes 3 tablets and on regular days she takes 2 tablets      . IRON PO Take 1 tablet by mouth daily.      Marland Kitchen losartan (COZAAR) 100 MG tablet Take 100 mg by mouth daily.       . meclizine (ANTIVERT) 25 MG tablet Take 25 mg by mouth as needed.      . meloxicam (MOBIC) 15 MG tablet Take 15 mg by mouth daily.      . Multiple Vitamin (MULTIVITAMIN) capsule Take 1 capsule by mouth daily.      Marland Kitchen  omeprazole (PRILOSEC) 20 MG capsule Take 20 mg by mouth 2 (two) times daily.       Bertram Gala Glycol-Propyl Glycol (SYSTANE OP) Apply to eye as directed.      . simvastatin (ZOCOR) 40 MG tablet Take 40 mg by mouth every evening.      . Travoprost, BAK Free, (TRAVATAN) 0.004 % SOLN ophthalmic solution 1 drop as directed.      . Triamcinolone Acetonide (NASACORT ALLERGY 24HR) 55 MCG/ACT AERO Place into the nose as directed.      . Undecylenic Ac-Zn Undecylenate (FUNGI-NAIL TOE & FOOT EX) Apply topically 2 (two) times daily.       No current facility-administered medications for this visit.     Allergies  Clindamycin/lincomycin; Septra; Sulfa antibiotics; Mold extract; and Pollen extract  Electrocardiogram:  SR rate 69 normal   Assessment and Plan

## 2012-11-23 ENCOUNTER — Telehealth: Payer: Self-pay | Admitting: Cardiovascular Disease

## 2012-11-23 NOTE — Telephone Encounter (Signed)
Spoke with pt. Sophia Suarez is too expensive. She is asking if Dr. Eden Emms can prescribe an alternative.

## 2012-11-23 NOTE — Telephone Encounter (Signed)
New problem   Pt went to pick up her prescription at pharmacy and it was to expensive, pt don't know name of medication but need a diaretic sulfur base medication. Please call pt. Medication was for retention of fluid.

## 2012-11-26 NOTE — Telephone Encounter (Signed)
All the other non sulfur diuretics are potassium sparing and need to be monitored. Can see if dyrenium or triamterene 100 mg daily is cheap enough.  Would start qod and check postassium in 2 weeks after starting.

## 2012-11-27 NOTE — Telephone Encounter (Signed)
Try HCTZ 12.5 daily Will need to fill script and take first dose in our office and be watched for one hour  Tell her to bring a book  She also needs a script for one epi pen

## 2012-11-27 NOTE — Telephone Encounter (Signed)
PT AWARE ./CY  PER PHARMACY  DYRENIUM  FOR 15  TABS WILL RUN $52.34  A MONTH PT IS WILLING TO TAKE  THIAZIDE OR  FUROSEMIDE AGAIN  HAD TAKEN IN PAST AND DID NOT REMEMBER HAVING  ANY DIFFICULTIES WILL FORWARD TO DR Eden Emms FOR REVIEW .Zack Seal

## 2012-11-28 MED ORDER — HYDROCHLOROTHIAZIDE 12.5 MG PO CAPS: 12.5000 mg | ORAL_CAPSULE | Freq: Every day | ORAL | Status: DC

## 2012-11-28 MED ORDER — EPINEPHRINE 0.3 MG/0.3ML IJ SOAJ: 0.3000 mg | Freq: Once | INTRAMUSCULAR | Status: DC

## 2012-11-28 NOTE — Telephone Encounter (Signed)
PT AWARE OF PLAN,  MEDS  CALLED INTO  PHARMACY  . PT TO COME TO OFFICE ON Friday  AT 10:00 AM FOR MONITORING./CY

## 2012-11-30 ENCOUNTER — Ambulatory Visit (INDEPENDENT_AMBULATORY_CARE_PROVIDER_SITE_OTHER): Payer: Medicare Other | Admitting: *Deleted

## 2012-11-30 VITALS — BP 152/80 | HR 80 | Ht 59.0 in | Wt 177.0 lb

## 2012-11-30 DIAGNOSIS — I10 Essential (primary) hypertension: Secondary | ICD-10-CM

## 2012-11-30 NOTE — Progress Notes (Signed)
PT CAME IN THIS AM  FOR MONITORING WITH  THE INITIATING OF TAKING   HCTZ 12.5 MG EPI PEN WAS ALSO AVAILABLE   MED WAS TAKEN AT  9:45 AM CHECKED ON PT MULTIPLE TIMES  NO SIGNS  OF ANY ALLERGIC REACTION AFTER AN HOUR WAIT PT LEFT OFFICE  WITH  NO  COMPLAINTS.PT  APPEARED TO HAVE TOLERATED  TAKING  MED .Zack Seal

## 2012-12-21 ENCOUNTER — Telehealth: Payer: Self-pay | Admitting: Cardiovascular Disease

## 2012-12-21 NOTE — Telephone Encounter (Signed)
New Prob    Reporting BP  6/12 at 11:45 PM 114/85  Please call.

## 2012-12-25 NOTE — Telephone Encounter (Signed)
NOTED ./CY 

## 2013-01-01 ENCOUNTER — Telehealth: Payer: Self-pay | Admitting: Cardiovascular Disease

## 2013-01-01 NOTE — Telephone Encounter (Signed)
She will let us know what her A1c is She feels much better on HCTZ Weight down 5 lbs and BP perfect.  Alternatives include taking qod or trying lasix 10mg  Would have to be watched in office like we did with HCTZ.  The non sulfur based diuretics were too expensive for her

## 2013-01-01 NOTE — Telephone Encounter (Signed)
New problem   pts pcp thinks hydrochlorothiazide needs to be adjusted because it causes her blood sugar to spike

## 2013-01-01 NOTE — Telephone Encounter (Signed)
WILL FORWARD TO DR NISHAN FOR  REVIEW./CY 

## 2013-01-08 ENCOUNTER — Other Ambulatory Visit (HOSPITAL_COMMUNITY): Payer: Self-pay | Admitting: Diagnostic Neuroimaging

## 2013-01-15 NOTE — Telephone Encounter (Signed)
New Prob     Pt would like to provide results of A1c. Please call.

## 2013-01-15 NOTE — Telephone Encounter (Signed)
Pt called to let  Dr. Eden Emms know that the result of A1c  is "6.1"

## 2013-01-16 NOTE — Telephone Encounter (Signed)
PT  AWARE   WILL FORWARD TO DR Eden Emms FOR REVIEW .Sophia Suarez

## 2013-01-17 NOTE — Telephone Encounter (Signed)
A1c pretty good  Continue current meds.  Low carb diet f/u primary

## 2013-01-18 NOTE — Telephone Encounter (Signed)
PT WANTS TO KNOW  IF SHE SHOULD  CONT  TAKING QOD OR QD  HCTZ./CY

## 2013-01-20 NOTE — Telephone Encounter (Signed)
Qod ok

## 2013-01-21 NOTE — Telephone Encounter (Signed)
PT AWARE  OKAY TO TAKE  HCTZ  QOD .Zack Seal

## 2013-02-04 ENCOUNTER — Other Ambulatory Visit (HOSPITAL_COMMUNITY): Payer: Self-pay | Admitting: Diagnostic Neuroimaging

## 2013-02-08 ENCOUNTER — Other Ambulatory Visit (HOSPITAL_COMMUNITY): Payer: Self-pay | Admitting: Diagnostic Neuroimaging

## 2013-03-28 ENCOUNTER — Encounter: Payer: Self-pay | Admitting: Diagnostic Neuroimaging

## 2013-03-28 ENCOUNTER — Ambulatory Visit (INDEPENDENT_AMBULATORY_CARE_PROVIDER_SITE_OTHER): Payer: Medicare Other | Admitting: Diagnostic Neuroimaging

## 2013-03-28 VITALS — BP 142/70 | HR 77 | Temp 98.5°F | Ht 60.5 in | Wt 167.0 lb

## 2013-03-28 DIAGNOSIS — I6521 Occlusion and stenosis of right carotid artery: Secondary | ICD-10-CM

## 2013-03-28 DIAGNOSIS — G459 Transient cerebral ischemic attack, unspecified: Secondary | ICD-10-CM

## 2013-03-28 DIAGNOSIS — I6529 Occlusion and stenosis of unspecified carotid artery: Secondary | ICD-10-CM

## 2013-03-28 MED ORDER — CLOPIDOGREL BISULFATE 75 MG PO TABS: 75.0000 mg | ORAL_TABLET | Freq: Every day | ORAL | Status: DC

## 2013-03-28 NOTE — Progress Notes (Signed)
[facility]  PATIENT: Sophia Suarez DOB: 1941/05/18  REFERRING CLINICIAN:  HISTORY FROM: patient  REASON FOR VISIT: follow up   HISTORICAL  CHIEF COMPLAINT:  Chief Complaint  Patient presents with  . Follow-up    HISTORY OF PRESENT ILLNESS:   UPDATE 03/27/13: Since last visit, no new events. No recurrent TIA type symptoms. Following up with Dr. Imogene Burn, Dr. Eden Emms and Dr. Paulino Rily.  UPDATE 11/02/11: Patient well-known to me from prior visit for vertigo.  Now with new onset of left face numbness, left hand numbness, left face pulling and weakness since the past 6-7 months.  Initial events lasted for several hours a time.  Was recent event lasted for 3 weeks and just recently resolved.  She sometimes has left head headache and pain sensation prior to these symptoms.  She feels in her left cheek and left lower jaw region numbness and tingling sensation.  She is never had these scans of sensations before. Has known right ICA stenosis from prior carotid u/s (50-69%). Had repeat carotid u/s last week with Dr. Imogene Burn.  PRIOR HPI (04/13/10): 72 year old right-handed female (converted left-hander), with hypertension, diabetes, hypercholesterolemia, presenting for evaluation of recurrent episodes of vertigo and right internal carotid artery stenosis.  Patient reports intermittent episodes of spinning sensation, associated with nausea and vomiting, and sometimes with passing out, since her early teenage years. These episodes typically triggered by changes in weather or q.d. She reports a spinning either to the right side, to the left side or tumbling forward. If she takes several deep breaths this usually terminates the symptoms. She denies double vision, slurred speech, numbness, weakness, headache with these episodes. Changing position from lying to sitting or sitting to stand is not usually triggered episodes. Physical activity does not seem to trigger the episodes.  She was  recently diagnosed with right internal carotid artery stenosis (50-69%) and subsequently referred here for evaluation.  REVIEW OF SYSTEMS: Full 14 system review of systems performed and notable only for fatigue blurred vision murmurs swelling legs during flushing blood in stool incontinence constipation cramps spinning sensation.  ALLERGIES: Allergies  Allergen Reactions  . Clindamycin/Lincomycin Anaphylaxis  . Septra [Sulfamethoxazole-Tmp Ds] Anaphylaxis  . Sulfa Antibiotics Anaphylaxis  . Horse-Derived Products   . Mold Extract [Trichophyton]   . Pollen Extract     HOME MEDICATIONS: Prior to Admission medications   Medication Sig Start Date End Date Taking? Authorizing Provider  albuterol (PROAIR HFA) 108 (90 BASE) MCG/ACT inhaler Inhale 2 puffs into the lungs every 6 (six) hours as needed.   Yes Historical Provider, MD  AMOXICILLIN PO Take 1 tablet by mouth 2 (two) times daily.   Yes Historical Provider, MD  aspirin 81 MG tablet Take 81 mg by mouth daily.   Yes Historical Provider, MD  Cyanocobalamin (VITAMIN B-12 PO) Take by mouth daily.   Yes Historical Provider, MD  diphenhydrAMINE (BENADRYL) 50 MG capsule Take 50 mg by mouth every 6 (six) hours as needed. Pt states she takes 2 tabs at night and 2 tabs in the morning and maybe one tab in the evening   Yes Historical Provider, MD  EPINEPHrine (EPI-PEN) 0.3 mg/0.3 mL DEVI Inject 0.3 mLs (0.3 mg total) into the muscle once. 11/28/12  Yes Wendall Stade, MD  fish oil-omega-3 fatty acids 1000 MG capsule Take 2 g by mouth 2 (two) times daily.    Yes Historical Provider, MD  Glucosamine-Chondroitin (GLUCOSAMINE CHONDR COMPLEX) 500-400 MG CAPS Take 1,000 mg by mouth 2 (two)  times daily. Pt states on rainy days she takes 3 tablets and on regular days she takes 2 tablets   Yes Historical Provider, MD  hydrochlorothiazide (MICROZIDE) 12.5 MG capsule Take 1 capsule (12.5 mg total) by mouth daily. 11/28/12  Yes Wendall Stade, MD  IRON PO Take 1  tablet by mouth daily.   Yes Historical Provider, MD  losartan (COZAAR) 100 MG tablet Take 100 mg by mouth daily.    Yes Historical Provider, MD  meclizine (ANTIVERT) 25 MG tablet Take 25 mg by mouth as needed.   Yes Historical Provider, MD  meloxicam (MOBIC) 15 MG tablet Take 15 mg by mouth daily.   Yes Historical Provider, MD  Multiple Vitamin (MULTIVITAMIN) capsule Take 1 capsule by mouth daily.   Yes Historical Provider, MD  omeprazole (PRILOSEC) 20 MG capsule Take 20 mg by mouth 2 (two) times daily.    Yes Historical Provider, MD  Polyethyl Glycol-Propyl Glycol (SYSTANE OP) Apply to eye as directed.   Yes Historical Provider, MD  simvastatin (ZOCOR) 40 MG tablet Take 40 mg by mouth every evening.   Yes Historical Provider, MD  Travoprost, BAK Free, (TRAVATAN) 0.004 % SOLN ophthalmic solution 1 drop as directed.   Yes Historical Provider, MD  Triamcinolone Acetonide (NASACORT ALLERGY 24HR) 55 MCG/ACT AERO Place into the nose as directed.   Yes Historical Provider, MD  Undecylenic Ac-Zn Undecylenate (FUNGI-NAIL TOE & FOOT EX) Apply topically 2 (two) times daily.   Yes Historical Provider, MD  clopidogrel (PLAVIX) 75 MG tablet TAKE 1 TABLET BY MOUTH EVERY DAY 02/08/13   Suanne Marker, MD   Outpatient Prescriptions Prior to Visit  Medication Sig Dispense Refill  . albuterol (PROAIR HFA) 108 (90 BASE) MCG/ACT inhaler Inhale 2 puffs into the lungs every 6 (six) hours as needed.      . Cyanocobalamin (VITAMIN B-12 PO) Take by mouth daily.      . diphenhydrAMINE (BENADRYL) 50 MG capsule Take 50 mg by mouth every 6 (six) hours as needed. Pt states she takes 2 tabs at night and 2 tabs in the morning and maybe one tab in the evening      . EPINEPHrine (EPI-PEN) 0.3 mg/0.3 mL DEVI Inject 0.3 mLs (0.3 mg total) into the muscle once.  1 Device  1  . fish oil-omega-3 fatty acids 1000 MG capsule Take 2 g by mouth 2 (two) times daily.       . Glucosamine-Chondroitin (GLUCOSAMINE CHONDR COMPLEX) 500-400 MG  CAPS Take 1,000 mg by mouth 2 (two) times daily. Pt states on rainy days she takes 3 tablets and on regular days she takes 2 tablets      . hydrochlorothiazide (MICROZIDE) 12.5 MG capsule Take 1 capsule (12.5 mg total) by mouth daily.  30 capsule  6  . IRON PO Take 1 tablet by mouth daily.      Marland Kitchen losartan (COZAAR) 100 MG tablet Take 100 mg by mouth daily.       . meclizine (ANTIVERT) 25 MG tablet Take 25 mg by mouth as needed.      . meloxicam (MOBIC) 15 MG tablet Take 15 mg by mouth daily.      . Multiple Vitamin (MULTIVITAMIN) capsule Take 1 capsule by mouth daily.      Marland Kitchen omeprazole (PRILOSEC) 20 MG capsule Take 20 mg by mouth 2 (two) times daily.       Bertram Gala Glycol-Propyl Glycol (SYSTANE OP) Apply to eye as directed.      Marland Kitchen  simvastatin (ZOCOR) 40 MG tablet Take 40 mg by mouth every evening.      . Travoprost, BAK Free, (TRAVATAN) 0.004 % SOLN ophthalmic solution 1 drop as directed.      . Triamcinolone Acetonide (NASACORT ALLERGY 24HR) 55 MCG/ACT AERO Place into the nose as directed.      . Undecylenic Ac-Zn Undecylenate (FUNGI-NAIL TOE & FOOT EX) Apply topically 2 (two) times daily.      . clopidogrel (PLAVIX) 75 MG tablet TAKE 1 TABLET BY MOUTH EVERY DAY  10 tablet  0   No facility-administered medications prior to visit.    PAST MEDICAL HISTORY: Past Medical History  Diagnosis Date  . Hypertension   . Diabetes mellitus   . Hyperlipidemia   . Carotid artery occlusion   . GERD (gastroesophageal reflux disease)   . Asthma   . Diverticulitis   . Chronic pain   . Lumbago   . Heart murmur   . Panic attacks   . Stroke   . Allergic rhinitis, seasonal   . Anemia     PAST SURGICAL HISTORY: Past Surgical History  Procedure Laterality Date  . Appendectomy    . Ovarian cyst surgery    . Partial hysterectomy    . Colectomy      extent uncertain  . Abdominal hysterectomy      FAMILY HISTORY: Family History  Problem Relation Age of Onset  . Aneurysm Mother     aortic    . Coronary artery disease Mother     CABG  . Cancer Mother   . Diabetes Mother   . Heart disease Mother     AAA  . Hypertension Mother   . Cancer Father     Brain  . Hypertension Father   . Deep vein thrombosis Father   . Hypertension Brother   . Hypertension Son     SOCIAL HISTORY:  History   Social History  . Marital Status: Divorced    Spouse Name: N/A    Number of Children: 2  . Years of Education: N/A   Occupational History  . Retired     [employer].   Social History Main Topics  . Smoking status: Never Smoker   . Smokeless tobacco: Never Used  . Alcohol Use: 0 - .5 oz/week    0-1 drink(s) per week  . Drug Use: No  . Sexual Activity: Not on file   Other Topics Concern  . Not on file   Social History Narrative   Patient lives at home alone.   Caffeine Use: 1-2 cups daily     PHYSICAL EXAM  Filed Vitals:   03/28/13 1527  BP: 142/70  Pulse: 77  Temp: 98.5 F (36.9 C)  TempSrc: Oral  Height: 5' 0.5" (1.537 m)  Weight: 167 lb (75.751 kg)    Not recorded    Body mass index is 32.07 kg/(m^2).  GENERAL EXAM: General: Patient is awake, alert and in no acute distress. Well developed and groomed. DIX HALLPIKE NEGATIVE. Neck: Neck is supple. Cardiovascular: RIGHT CAROTID BRUIT. Heart is regular rate and rhythm with SYSTOLIC MURMUR OVER RIGHT PRECORDIAL REGION.  Neurologic Exam  Mental Status: Awake, alert.  Language is fluent and comprehension intact. Cranial Nerves: Pupils are equal and reactive to light.  Visual fields are full to confrontation.  Conjugate eye movements are full and symmetric.  Facial sensation and strength are symmetric.  Hearing is intact.  Palate elevated symmetrically and uvula is midline.  Shoulder shug is symmetric.  Tongue is midline. Motor: Normal bulk and tone.  Full strength in the upper and lower extremities.  No pronator drift. Sensory: Intact and symmetric to light touch, pinprick.  Coordination: No ataxia or  dysmetria on finger-nose or rapid alternating movement testing. Gait and Station: Narrow based gait, able to walk on heels and toes.  Tandem gait is stable.  Romberg is negative. Reflexes: Deep tendon reflexes in the upper and lower extremity are TRACE IN UPPER, ABSENT IN LOWER.   DIAGNOSTIC DATA (LABS, IMAGING, TESTING) - I reviewed patient records, labs, notes, testing and imaging myself where available.  No results found for this basename: WBC, HGB, HCT, MCV, PLT      Component Value Date/Time   BUN 15 11/04/2011 1200   CREATININE 0.71 11/04/2011 1200   No results found for this basename: CHOL, HDL, LDLCALC, LDLDIRECT, TRIG, CHOLHDL   No results found for this basename: HGBA1C   No results found for this basename: VITAMINB12   No results found for this basename: TSH    11/09/11 CTA head 1. Mild ICA siphon and distal right vertebral artery atherosclerosis. No intracranial stenosis or major branch occlusion.  2. Mild ectasia at the right MCA trifurcation with no discrete aneurysm.  3. No acute intracranial abnormality. Stable mild nonspecific white matter changes.  11/09/11 CTA neck  1. Normal left cervical carotid artery. Mild bilateral vertebral artery origin atherosclerosis.  2. Soft and calcified plaque extends from the right ICA origin 25 mm distally and results in up to 55-60 % stenosis with respect to the distal vessel.  11/17/11 MRI brain  1. Mild periventricular and subcortical chronic small vessel ischemic disease. 2. No acute findings.   ASSESSMENT AND PLAN  72 y.o. female with hypertension, diabetes, hypercholesterolemia and right internal carotid artery stenosis (55-60% by CTA, 50-69% by ultrasound), with intermittent left face/arm numbness and left face weakness.  Suspicious for transient ischemic attacks (right hemisphere localization). Symptoms have resolved. Dr. Imogene Burn (vascular surgery) recommended medical management.   Dx: possible symptomatic right internal  carotid artery stenosis  PLAN: 1. Continue BP, diabetes, lipid control 2. Continue plavix 75mg  daily  Return if symptoms worsen or fail to improve, for return to PCP.    Suanne Marker, MD 03/28/2013, 4:07 PM Certified in Neurology, Neurophysiology and Neuroimaging  [facility] [address] [place], [place] [address] [phone]

## 2013-05-08 ENCOUNTER — Encounter: Payer: Self-pay | Admitting: Diagnostic Neuroimaging

## 2013-05-08 ENCOUNTER — Ambulatory Visit (INDEPENDENT_AMBULATORY_CARE_PROVIDER_SITE_OTHER): Payer: Medicare Other | Admitting: Diagnostic Neuroimaging

## 2013-05-08 VITALS — BP 150/83 | HR 68 | Temp 97.8°F | Ht 60.0 in | Wt 174.0 lb

## 2013-05-08 DIAGNOSIS — I6521 Occlusion and stenosis of right carotid artery: Secondary | ICD-10-CM

## 2013-05-08 DIAGNOSIS — G459 Transient cerebral ischemic attack, unspecified: Secondary | ICD-10-CM

## 2013-05-08 DIAGNOSIS — G43009 Migraine without aura, not intractable, without status migrainosus: Secondary | ICD-10-CM

## 2013-05-08 DIAGNOSIS — I6529 Occlusion and stenosis of unspecified carotid artery: Secondary | ICD-10-CM

## 2013-05-08 NOTE — Progress Notes (Signed)
[facility]  PATIENT: Sophia Suarez DOB: 1941/05/02  REFERRING CLINICIAN:  HISTORY FROM: patient  REASON FOR VISIT: follow up / referral by ENT   HISTORICAL  CHIEF COMPLAINT:  Chief Complaint  Patient presents with  . Headache    w/ veritgo    HISTORY OF PRESENT ILLNESS:   UPDATE 05/08/13: Since last visit patient had worsening left frontal headaches. In retrospect patient was having some headaches in August 2014 but did not reveal this to me at last visit. Headaches are associated with left frontal pain, lasting hours or days at a time. These headaches were associated with some nausea, severe pain, spinning sensation and vertigo. Patient went to PCP was treated for sinus infections. She then went to ENT who evaluated patient and thought symptoms were related to migraine phenomenon. Patient was referred back to me for further followup. Also of note, patient's ENT recommended she limit her chocolate and cheese intake as these are potential migraine triggers. Patient made modification to her diet and since that time her headaches and vertigo have significantly improved.  UPDATE 03/27/13: Since last visit, no new events. No recurrent TIA type symptoms. Following up with Dr. Imogene Burn, Dr. Eden Emms and Dr. Paulino Rily.  UPDATE 11/02/11: Patient well-known to me from prior visit for vertigo.  Now with new onset of left face numbness, left hand numbness, left face pulling and weakness since the past 6-7 months.  Initial events lasted for several hours a time.  Was recent event lasted for 3 weeks and just recently resolved.  She sometimes has left head headache and pain sensation prior to these symptoms.  She feels in her left cheek and left lower jaw region numbness and tingling sensation.  She is never had these scans of sensations before. Has known right ICA stenosis from prior carotid u/s (50-69%). Had repeat carotid u/s last week with Dr. Imogene Burn.  PRIOR HPI (04/13/10): 72 year old  right-handed female (converted left-hander), with hypertension, diabetes, hypercholesterolemia, presenting for evaluation of recurrent episodes of vertigo and right internal carotid artery stenosis.  Patient reports intermittent episodes of spinning sensation, associated with nausea and vomiting, and sometimes with passing out, since her early teenage years. These episodes typically triggered by changes in weather or q.d. She reports a spinning either to the right side, to the left side or tumbling forward. If she takes several deep breaths this usually terminates the symptoms. She denies double vision, slurred speech, numbness, weakness, headache with these episodes. Changing position from lying to sitting or sitting to stand is not usually triggered episodes. Physical activity does not seem to trigger the episodes.  She was recently diagnosed with right internal carotid artery stenosis (50-69%) and subsequently referred here for evaluation.  REVIEW OF SYSTEMS: Full 14 system review of systems performed and notable only for weight gain fatigue spinning sensation blurred vision shortness of breath snoring incontinence diarrhea constipation incontinence joint swelling allergy runny nose can infection frequent infections dizziness tremor headache insomnia snoring easy bruising.   ALLERGIES: Allergies  Allergen Reactions  . Clindamycin/Lincomycin Anaphylaxis  . Septra [Sulfamethoxazole-Tmp Ds] Anaphylaxis  . Sulfa Antibiotics Anaphylaxis  . Horse-Derived Products   . Mold Extract [Trichophyton]   . Pollen Extract     HOME MEDICATIONS: Outpatient Prescriptions Prior to Visit  Medication Sig Dispense Refill  . albuterol (PROAIR HFA) 108 (90 BASE) MCG/ACT inhaler Inhale 2 puffs into the lungs every 6 (six) hours as needed.      . AMOXICILLIN PO Take 1 tablet by mouth  2 (two) times daily.      . clopidogrel (PLAVIX) 75 MG tablet Take 1 tablet (75 mg total) by mouth daily.  30 tablet  6  .  Cyanocobalamin (VITAMIN B-12 PO) Take by mouth daily.      . diphenhydrAMINE (BENADRYL) 50 MG capsule Take 50 mg by mouth every 6 (six) hours as needed. Pt states she takes 2 tabs at night and 2 tabs in the morning and maybe one tab in the evening      . EPINEPHrine (EPI-PEN) 0.3 mg/0.3 mL DEVI Inject 0.3 mLs (0.3 mg total) into the muscle once.  1 Device  1  . fish oil-omega-3 fatty acids 1000 MG capsule Take 2 g by mouth 2 (two) times daily. Taking 3 capsules in the am and 2 @ pm      . Glucosamine-Chondroitin (GLUCOSAMINE CHONDR COMPLEX) 500-400 MG CAPS Take 1,000 mg by mouth 2 (two) times daily. Pt states on rainy days she takes 3 tablets and on regular days she takes 2 tablets      . hydrochlorothiazide (MICROZIDE) 12.5 MG capsule Take 1 capsule (12.5 mg total) by mouth daily.  30 capsule  6  . IRON PO Take 1 tablet by mouth daily.      Marland Kitchen losartan (COZAAR) 100 MG tablet Take 100 mg by mouth daily.       . meclizine (ANTIVERT) 25 MG tablet Take 25 mg by mouth as needed.      . meloxicam (MOBIC) 15 MG tablet Take 15 mg by mouth daily.      . Multiple Vitamin (MULTIVITAMIN) capsule Take 1 capsule by mouth daily.      Marland Kitchen omeprazole (PRILOSEC) 20 MG capsule Take 20 mg by mouth 2 (two) times daily.       Bertram Gala Glycol-Propyl Glycol (SYSTANE OP) Apply to eye as directed.      . simvastatin (ZOCOR) 40 MG tablet Take 40 mg by mouth every evening.      . Travoprost, BAK Free, (TRAVATAN) 0.004 % SOLN ophthalmic solution 1 drop as directed.      . Undecylenic Ac-Zn Undecylenate (FUNGI-NAIL TOE & FOOT EX) Apply topically 2 (two) times daily.      Marland Kitchen aspirin 81 MG tablet Take 81 mg by mouth daily.      . Triamcinolone Acetonide (NASACORT ALLERGY 24HR) 55 MCG/ACT AERO Place into the nose as directed.       No facility-administered medications prior to visit.    PAST MEDICAL HISTORY: Past Medical History  Diagnosis Date  . Hypertension   . Diabetes mellitus   . Hyperlipidemia   . Carotid artery  occlusion   . GERD (gastroesophageal reflux disease)   . Asthma   . Diverticulitis   . Chronic pain   . Lumbago   . Heart murmur   . Panic attacks   . Stroke   . Allergic rhinitis, seasonal   . Anemia     PAST SURGICAL HISTORY: Past Surgical History  Procedure Laterality Date  . Appendectomy    . Ovarian cyst surgery    . Partial hysterectomy    . Colectomy      extent uncertain  . Abdominal hysterectomy      FAMILY HISTORY: Family History  Problem Relation Age of Onset  . Aneurysm Mother     aortic  . Coronary artery disease Mother     CABG  . Cancer Mother   . Diabetes Mother   . Heart disease Mother  AAA  . Hypertension Mother   . Cancer Father     Brain  . Hypertension Father   . Deep vein thrombosis Father   . Hypertension Brother   . Hypertension Son     SOCIAL HISTORY:  History   Social History  . Marital Status: Divorced    Spouse Name: N/A    Number of Children: 2  . Years of Education: College   Occupational History  . Retired     [employer].   Social History Main Topics  . Smoking status: Never Smoker   . Smokeless tobacco: Never Used  . Alcohol Use: 0 - .5 oz/week    0-1 drink(s) per week     Comment: occasionally; 1 drink monthly  . Drug Use: No  . Sexual Activity: Not on file   Other Topics Concern  . Not on file   Social History Narrative   Patient lives at home alone.   Caffeine Use: 1-2 cups daily     PHYSICAL EXAM  Filed Vitals:   05/08/13 1027  BP: 150/83  Pulse: 68  Temp: 97.8 F (36.6 C)  TempSrc: Oral  Height: 5' (1.524 m)  Weight: 174 lb (78.926 kg)    Not recorded    Body mass index is 33.98 kg/(m^2).  GENERAL EXAM: General: Patient is awake, alert and in no acute distress. Well developed and groomed. DIX HALLPIKE NEGATIVE. Neck: Neck is supple. Cardiovascular:  Heart is regular rate and rhythm with SYSTOLIC MURMUR WITH RADIATION TO BILATERAL CAROTIDS.   Neurologic Exam  Mental Status:  Awake, alert.  Language is fluent and comprehension intact. Cranial Nerves: Pupils are equal and reactive to light.  Visual fields are full to confrontation.  Conjugate eye movements are full and symmetric.  Facial sensation and strength are symmetric.  Hearing is intact.  Palate elevated symmetrically and uvula is midline.  Shoulder shug is symmetric.  Tongue is midline. Motor: Normal bulk and tone.  Full strength in the upper and lower extremities.  No pronator drift. Sensory: Intact and symmetric to light touch, pinprick.  Coordination: No ataxia or dysmetria on finger-nose or rapid alternating movement testing. Gait and Station: Narrow based gait, able to walk on heels and toes.  Tandem gait is stable.  Romberg is negative. Reflexes: Deep tendon reflexes in the upper and lower extremity are TRACE IN UPPER, ABSENT IN LOWER.   DIAGNOSTIC DATA (LABS, IMAGING, TESTING) - I reviewed patient records, labs, notes, testing and imaging myself where available.  No results found for this basename: WBC,  HGB,  HCT,  MCV,  PLT      Component Value Date/Time   BUN 15 11/04/2011 1200   CREATININE 0.71 11/04/2011 1200   No results found for this basename: CHOL,  HDL,  LDLCALC,  LDLDIRECT,  TRIG,  CHOLHDL   No results found for this basename: HGBA1C   No results found for this basename: VITAMINB12   No results found for this basename: TSH    11/09/11 CTA head 1. Mild ICA siphon and distal right vertebral artery atherosclerosis. No intracranial stenosis or major branch occlusion.  2. Mild ectasia at the right MCA trifurcation with no discrete aneurysm.  3. No acute intracranial abnormality. Stable mild nonspecific white matter changes.  11/09/11 CTA neck  1. Normal left cervical carotid artery. Mild bilateral vertebral artery origin atherosclerosis.  2. Soft and calcified plaque extends from the right ICA origin 25 mm distally and results in up to 55-60 % stenosis with  respect to the distal  vessel.  11/17/11 MRI brain  1. Mild periventricular and subcortical chronic small vessel ischemic disease. 2. No acute findings.   ASSESSMENT AND PLAN  72 y.o. female with hypertension, diabetes, hypercholesterolemia and right internal carotid artery stenosis (55-60% by CTA, 50-69% by ultrasound), with intermittent left face/arm numbness and left face weakness.  Suspicious for transient ischemic attacks (right hemisphere localization). Symptoms have resolved. Dr. Imogene Burn (vascular surgery) recommended medical management.   Now with left frontal headaches, sinus pressure, vertigo, which likely represent migraine phenomenon. Patient has done well with imaging chocolate and cheese intake.  Dx: possible symptomatic right internal carotid artery stenosis + migraine without aura  PLAN: 1. Continue BP, diabetes, lipid control 2. Continue plavix 75mg  daily 3. Limit chocolate/cheese intake 4. Monitor HA. If greater than 1 day per week or 4 days per month, then patient may setup revisit to discuss migraine preventative medications / treatments.   Return if symptoms worsen or fail to improve, for return to PCP.    Suanne Marker, MD 05/08/2013, 11:47 AM Certified in Neurology, Neurophysiology and Neuroimaging  [facility] [address] [place], [place] [address] [phone]

## 2013-05-08 NOTE — Patient Instructions (Signed)
Monitor headaches. If greater than 1 day per week or 4 days per month, then setup revisit to discuss migraine preventative medications.

## 2013-05-21 ENCOUNTER — Ambulatory Visit (INDEPENDENT_AMBULATORY_CARE_PROVIDER_SITE_OTHER): Payer: Medicare Other | Admitting: Cardiovascular Disease

## 2013-05-21 ENCOUNTER — Encounter: Payer: Self-pay | Admitting: Cardiovascular Disease

## 2013-05-21 VITALS — BP 130/78 | HR 76 | Ht 60.0 in | Wt 173.8 lb

## 2013-05-21 DIAGNOSIS — I350 Nonrheumatic aortic (valve) stenosis: Secondary | ICD-10-CM | POA: Insufficient documentation

## 2013-05-21 DIAGNOSIS — I6350 Cerebral infarction due to unspecified occlusion or stenosis of unspecified cerebral artery: Secondary | ICD-10-CM

## 2013-05-21 DIAGNOSIS — I639 Cerebral infarction, unspecified: Secondary | ICD-10-CM

## 2013-05-21 DIAGNOSIS — I10 Essential (primary) hypertension: Secondary | ICD-10-CM

## 2013-05-21 DIAGNOSIS — I359 Nonrheumatic aortic valve disorder, unspecified: Secondary | ICD-10-CM

## 2013-05-21 NOTE — Progress Notes (Signed)
Patient ID: [id number] PAK, female   DOB: [birth year], 72 y.o.   MRN: [id number] 72 yo referred by Dr Imogene Burn for possible pre-surgical clearence. Has had TIAls with borderline carotid lesion. Sees Dr Marjory Lies as well. Reviewed CTA from 4/26 and RICA has a ? Of 55-60% stenosis not typically bad enough to operate on. Had a F/U MRI last Thursday Results not available. Over last 6 months has had multople episodes of left sided paresthesia Not clearly TIA;s and ? Migraine, vascular headaches or neuralgia No history of CAD. Active with no chest pain. Long standing murmur. Echo done 11/10/11 reviewed  Study Conclusions  - Left ventricle: The cavity size was normal. Wall thickness was increased in a pattern of mild LVH. Systolic function was vigorous. The estimated ejection fraction was in the range of 65% to 70%. Wall motion was normal; there were no regional wall motion abnormalities. Doppler parameters are consistent with abnormal left ventricular relaxation (grade 1 diastolic dysfunction). - Aortic valve: There was very mild stenosis. Mean gradient: 9mm Hg (S). Peak gradient: 14mm Hg (S). - Atrial septum: No defect or patent foramen ovale was identified.  CRF; Type 2 DM, elevated lipids and HTN all well Rx  Has edema and BP not ideally controlled Having trouble sleeping Cannot take sulfa based diuretics and melatonin   Despite "sulfa" allergy tolerated monitored administration of HCTZ in office with no issues.   ROS: Denies fever, malais, weight loss, blurry vision, decreased visual acuity, cough, sputum, SOB, hemoptysis, pleuritic pain, palpitaitons, heartburn, abdominal pain, melena, lower extremity edema, claudication, or rash.  All other systems reviewed and negative  General: Affect appropriate Healthy:  appears stated age HEENT: normal Neck supple with no adenopathy JVP normal left  bruits no thyromegaly Lungs clear with no wheezing and good diaphragmatic motion Heart:  S1/S2 mild AS   murmur, no rub, gallop or click PMI normal Abdomen: benighn, BS positve, no tenderness, no AAA no bruit.  No HSM or HJR Distal pulses intact with no bruits No edema Neuro non-focal Skin warm and dry No muscular weakness   Current Outpatient Prescriptions  Medication Sig Dispense Refill  . albuterol (PROAIR HFA) 108 (90 BASE) MCG/ACT inhaler Inhale 2 puffs into the lungs every 6 (six) hours as needed.      Marland Kitchen BIOTIN PO Take 2,000 mg by mouth daily.      . Cetirizine HCl (ZYRTEC ALLERGY) 10 MG CAPS Take 1 capsule by mouth daily.      . clopidogrel (PLAVIX) 75 MG tablet Take 1 tablet (75 mg total) by mouth daily.  30 tablet  6  . Cyanocobalamin (VITAMIN B-12 PO) Take by mouth daily.      . diphenhydrAMINE (BENADRYL) 50 MG capsule Take 50 mg by mouth every 6 (six) hours as needed. Pt states she takes 2 tabs at night and 2 tabs in the morning and maybe one tab in the evening      . Docusate Calcium (STOOL SOFTENER PO) Take by mouth.      . EPINEPHrine (EPI-PEN) 0.3 mg/0.3 mL DEVI Inject 0.3 mLs (0.3 mg total) into the muscle once.  1 Device  1  . fish oil-omega-3 fatty acids 1000 MG capsule Take 2 g by mouth 2 (two) times daily. Taking 3 capsules in the am and 2 @ pm      . Glucosamine-Chondroitin (GLUCOSAMINE CHONDR COMPLEX) 500-400 MG CAPS Take 1,000 mg by mouth 2 (two) times daily. Pt states on rainy days she takes 3 tablets and  on regular days she takes 2 tablets      . hydrochlorothiazide (MICROZIDE) 12.5 MG capsule Take 1 capsule (12.5 mg total) by mouth daily.  30 capsule  6  . losartan (COZAAR) 100 MG tablet Take 100 mg by mouth daily.       . meclizine (ANTIVERT) 25 MG tablet Take 25 mg by mouth as needed.      . meloxicam (MOBIC) 15 MG tablet Take 15 mg by mouth daily.      . Multiple Vitamin (MULTIVITAMIN) capsule Take 1 capsule by mouth daily.      Marland Kitchen omeprazole (PRILOSEC) 20 MG capsule Take 20 mg by mouth 2 (two) times daily.       Bertram Gala Glycol-Propyl Glycol (SYSTANE OP)  Apply to eye as directed.      . simvastatin (ZOCOR) 40 MG tablet Take 40 mg by mouth every evening.      . Travoprost, BAK Free, (TRAVATAN) 0.004 % SOLN ophthalmic solution 1 drop as directed.      . Undecylenic Ac-Zn Undecylenate (FUNGI-NAIL TOE & FOOT EX) Apply topically 2 (two) times daily.       No current facility-administered medications for this visit.    Allergies  Clindamycin/lincomycin; Septra; Sulfa antibiotics; Horse-derived products; Mold extract; and Pollen extract  Electrocardiogram:  5/15  SR rate 69 normal ECG   Assessment and Plan

## 2013-05-21 NOTE — Assessment & Plan Note (Signed)
See note from neuro ? Migraines Diet changed continue antiplatlets No sig carotid disease despite left bruit

## 2013-05-21 NOTE — Assessment & Plan Note (Signed)
Well controlled.  Continue current medications and low sodium Dash type diet.   Diuretic helps and tolerant to HCTZ despite "sulfur" allergy

## 2013-05-21 NOTE — Patient Instructions (Signed)
Your physician wants you to follow-up in:   MAY WITH DR Eden Emms WITH ECHO SAME DAY  You will receive a reminder letter in the mail two months in advance. If you don't receive a letter, please call our office to schedule the follow-up appointment. Your physician recommends that you continue on your current medications as directed. Please refer to the Current Medication list given to you today.  Your physician has requested that you have an echocardiogram. Echocardiography is a painless test that uses sound waves to create images of your heart. It provides your doctor with information about the size and shape of your heart and how well your heart's chambers and valves are working. This procedure takes approximately one hour. There are no restrictions for this procedure. DUE IN   MAY  2015

## 2013-05-21 NOTE — Assessment & Plan Note (Signed)
Mild by exam and echo f/u echo in May 2015

## 2013-05-28 ENCOUNTER — Other Ambulatory Visit: Payer: Self-pay

## 2013-05-28 MED ORDER — HYDROCHLOROTHIAZIDE 12.5 MG PO CAPS: 12.5000 mg | ORAL_CAPSULE | Freq: Every day | ORAL | Status: DC

## 2013-06-07 ENCOUNTER — Other Ambulatory Visit: Payer: Self-pay

## 2013-06-07 MED ORDER — CLOPIDOGREL BISULFATE 75 MG PO TABS: 75.0000 mg | ORAL_TABLET | Freq: Every day | ORAL | Status: DC

## 2013-06-07 NOTE — Telephone Encounter (Signed)
Pharmacy requests 90 days  

## 2013-06-13 ENCOUNTER — Encounter: Payer: Self-pay | Admitting: Family

## 2013-06-14 ENCOUNTER — Encounter: Payer: Self-pay | Admitting: Family

## 2013-06-14 ENCOUNTER — Other Ambulatory Visit: Payer: Self-pay | Admitting: *Deleted

## 2013-06-14 ENCOUNTER — Encounter (INDEPENDENT_AMBULATORY_CARE_PROVIDER_SITE_OTHER): Payer: Self-pay

## 2013-06-14 ENCOUNTER — Ambulatory Visit (HOSPITAL_COMMUNITY)
Admission: RE | Admit: 2013-06-14 | Discharge: 2013-06-14 | Disposition: A | Discharge status: Home / Self Care | Payer: Medicare Other | Source: Ambulatory Visit | Attending: Family | Admitting: Family

## 2013-06-14 ENCOUNTER — Other Ambulatory Visit (HOSPITAL_COMMUNITY): Payer: Medicare Other

## 2013-06-14 ENCOUNTER — Ambulatory Visit (INDEPENDENT_AMBULATORY_CARE_PROVIDER_SITE_OTHER): Payer: Medicare Other | Admitting: Family

## 2013-06-14 DIAGNOSIS — I6529 Occlusion and stenosis of unspecified carotid artery: Secondary | ICD-10-CM

## 2013-06-14 NOTE — Patient Instructions (Signed)
Stroke Prevention Some medical conditions and behaviors are associated with an increased chance of having a stroke. You may prevent a stroke by making healthy choices and managing medical conditions. Reduce your risk of having a stroke by:  Staying physically active. Get at least 30 minutes of activity on most or all days.  Not smoking. It may also be helpful to avoid exposure to secondhand smoke.  Limiting alcohol use. Moderate alcohol use is considered to be:  No more than 2 drinks per day for men.  No more than 1 drink per day for nonpregnant women.  Eating healthy foods.  Include 5 or more servings of fruits and vegetables a day.  Certain diets may be prescribed to address high blood pressure, high cholesterol, diabetes, or obesity.  Managing your cholesterol levels.  A low-saturated fat, low-trans fat, low-cholesterol, and high-fiber diet may control cholesterol levels.  Take any prescribed medicines to control cholesterol as directed by your caregiver.  Managing your diabetes.  A controlled-carbohydrate, controlled-sugar diet is recommended to manage diabetes.  Take any prescribed medicines to control diabetes as directed by your caregiver.  Controlling your high blood pressure (hypertension).  A low-salt (sodium), low-saturated fat, low-trans fat, and low-cholesterol diet is recommended to manage high blood pressure.  Take any prescribed medicines to control hypertension as directed by your caregiver.  Maintaining a healthy weight.  A reduced-calorie, low-sodium, low-saturated fat, low-trans fat, low-cholesterol diet is recommended to manage weight.  Stopping drug abuse.  Avoiding birth control pills.  Talk to your caregiver about the risks of taking birth control pills if you are over 35 years old, smoke, get migraines, or have ever had a blood clot.  Getting evaluated for sleep disorders (sleep apnea).  Talk to your caregiver about getting a sleep evaluation  if you snore a lot or have excessive sleepiness.  Taking medicines as directed by your caregiver.  For some people, aspirin or blood thinners (anticoagulants) are helpful in reducing the risk of forming abnormal blood clots that can lead to stroke. If you have the irregular heart rhythm of atrial fibrillation, you should be on a blood thinner unless there is a good reason you cannot take them.  Understand all your medicine instructions. SEEK IMMEDIATE MEDICAL CARE IF:   You have sudden weakness or numbness of the face, arm, or leg, especially on one side of the body.  You have sudden confusion.  You have trouble speaking (aphasia) or understanding.  You have sudden trouble seeing in one or both eyes.  You have sudden trouble walking.  You have dizziness.  You have a loss of balance or coordination.  You have a sudden, severe headache with no known cause.  You have new chest pain or an irregular heartbeat. Any of these symptoms may represent a serious problem that is an emergency. Do not wait to see if the symptoms will go away. Get medical help right away. Call your local emergency services (911 in [place] Do not drive yourself to the hospital. Document Released: 08/04/2004 Document Revised: 09/19/2011 Document Reviewed: 12/28/2012 [employer] Patient Information 2014 [employer].  

## 2013-06-14 NOTE — Progress Notes (Signed)
Established Carotid Patient  History of Present Illness  Sophia Suarez is a 72 y.o. female patient of Dr. Imogene Burn who has known carotid stenosis. She returns today for routine surveillance.  Patient has not had previous carotid artery intervention.  Patient has Positive history of stroke 5 or 6 years ago which manifested as dizziness, left facial drooping, left eye vision blurred, and left upper and lower extremity tingling and numbness, no weakness.  Patient denies any further TIA or stroke symptoms since 5-6 years ago.  Patient denies residual effects from her stroke other than a drawing feeling in left side of face when she gets very tired.   Patient reports New Medical or Surgical History: is under treatment for sinus problems, saw her PCP and ENT. Was mostly bed-bound for a while due to severe vertigo and falling; this has since resolved.  Patient states has has a known heart murmur since her first pregnancy. Also states she has severe OA in her back and left knee.   Pt Diabetic: Yes, patient states in good control Pt smoker: non-smoker  Pt meds include: Statin : Yes ASA: No Other anticoagulants/antiplatelets: Plavix   Past Medical History  Diagnosis Date  . Hypertension   . Diabetes mellitus   . Hyperlipidemia   . Carotid artery occlusion   . GERD (gastroesophageal reflux disease)   . Asthma   . Diverticulitis   . Chronic pain   . Lumbago   . Heart murmur   . Panic attacks   . Stroke   . Allergic rhinitis, seasonal   . Anemia     Social History History  Substance Use Topics  . Smoking status: Never Smoker   . Smokeless tobacco: Never Used  . Alcohol Use: 0 - .5 oz/week    0-1 drink(s) per week     Comment: occasionally; 1 drink monthly    Family History Family History  Problem Relation Age of Onset  . Aneurysm Mother     aortic  . Coronary artery disease Mother     CABG  . Cancer Mother   . Diabetes Mother   . Heart disease Mother     AAA  .  Hypertension Mother   . Cancer Father     Brain  . Hypertension Father   . Deep vein thrombosis Father   . Hypertension Brother   . Hypertension Son     Surgical History Past Surgical History  Procedure Laterality Date  . Appendectomy    . Ovarian cyst surgery    . Partial hysterectomy    . Colectomy      extent uncertain  . Abdominal hysterectomy      Allergies  Allergen Reactions  . Clindamycin/Lincomycin Anaphylaxis  . Septra [Sulfamethoxazole-Tmp Ds] Anaphylaxis  . Sulfa Antibiotics Anaphylaxis  . Horse-Derived Products   . Mold Extract [Trichophyton]   . Pollen Extract     Current Outpatient Prescriptions  Medication Sig Dispense Refill  . albuterol (PROAIR HFA) 108 (90 BASE) MCG/ACT inhaler Inhale 2 puffs into the lungs every 6 (six) hours as needed.      Marland Kitchen BIOTIN PO Take 2,000 mg by mouth daily.      . Cetirizine HCl (ZYRTEC ALLERGY) 10 MG CAPS Take 1 capsule by mouth daily.      . clopidogrel (PLAVIX) 75 MG tablet Take 1 tablet (75 mg total) by mouth daily.  90 tablet  1  . Cyanocobalamin (VITAMIN B-12 PO) Take by mouth daily.      Marland Kitchen  diphenhydrAMINE (BENADRYL) 50 MG capsule Take 50 mg by mouth every 6 (six) hours as needed. Pt states she takes 2 tabs at night and 2 tabs in the morning and maybe one tab in the evening      . Docusate Calcium (STOOL SOFTENER PO) Take by mouth.      . EPINEPHrine (EPI-PEN) 0.3 mg/0.3 mL DEVI Inject 0.3 mLs (0.3 mg total) into the muscle once.  1 Device  1  . fish oil-omega-3 fatty acids 1000 MG capsule Take 2 g by mouth 2 (two) times daily. Taking 3 capsules in the am and 2 @ pm      . Glucosamine-Chondroitin (GLUCOSAMINE CHONDR COMPLEX) 500-400 MG CAPS Take 1,000 mg by mouth 2 (two) times daily. Pt states on rainy days she takes 3 tablets and on regular days she takes 2 tablets      . hydrochlorothiazide (MICROZIDE) 12.5 MG capsule Take 1 capsule (12.5 mg total) by mouth daily.  90 capsule  3  . losartan (COZAAR) 100 MG tablet Take  100 mg by mouth daily.       . meclizine (ANTIVERT) 25 MG tablet Take 25 mg by mouth as needed.      . meloxicam (MOBIC) 15 MG tablet Take 15 mg by mouth daily.      . Multiple Vitamin (MULTIVITAMIN) capsule Take 1 capsule by mouth daily.      Marland Kitchen omeprazole (PRILOSEC) 20 MG capsule Take 20 mg by mouth 2 (two) times daily.       Bertram Gala Glycol-Propyl Glycol (SYSTANE OP) Apply to eye as directed.      . simvastatin (ZOCOR) 40 MG tablet Take 40 mg by mouth every evening.      . Travoprost, BAK Free, (TRAVATAN) 0.004 % SOLN ophthalmic solution 1 drop as directed.      . Undecylenic Ac-Zn Undecylenate (FUNGI-NAIL TOE & FOOT EX) Apply topically 2 (two) times daily.       No current facility-administered medications for this visit.    Physical Examination  Filed Vitals:   06/14/13 0926  BP: 122/75  Pulse: 64  Resp: 16   Filed Weights   06/14/13 0926  Weight: 171 lb (77.565 kg)   Body mass index is 33.4 kg/(m^2).   General: WDWN obese female in NAD GAIT: normal Eyes: PERRLA Pulmonary:  CTAB, Negative  Rales, Negative rhonchi, & Negative wheezing.  Cardiac: regular Rhythm ,  Positive Murmur.  VASCULAR EXAM Carotid Bruits Left Right   Transmitted cardiac murmur Transmitted cardiac murmur     Radial pulses are 2+ palpable and equal.                                                                                                                            LE Pulses LEFT RIGHT       POPLITEAL   palpable    palpable       POSTERIOR TIBIAL  not palpable   not palpable  DORSALIS PEDIS      ANTERIOR TIBIAL  palpable   palpable     Gastrointestinal: soft, nontender, BS WNL, no r/g,  negative masses.  Musculoskeletal: Negative muscle atrophy/wasting. M/S 5/5 throughout, Extremities without ischemic changes.  Neurologic: A&O X 3; Appropriate Affect ; SENSATION ;normal;  Speech is normal CN 2-12 intact, Pain and light touch intact in extremities, Motor exam as listed  above.   Non-Invasive Vascular Imaging CAROTID DUPLEX 06/14/2013   Right ICA: 60 - 79 % stenosis. Left ICA: <40% stenosis.  Previous carotid studies demonstrated: RICA 40 - 59 % stenosis, LICA <40% stenosis.  These findings are Worse from previous exam but the same as carotid Duplex in Feb., 2013.  Assessment: Sophia Suarez is a 72 y.o. female who presents with asymptomatic moderate/severe right ICA stenosis and minimal left ICA stenosis. These findings are Worse from previous exam but the same as carotid Duplex in Feb., 2013.   Plan: Follow-up in 6 months with Carotid Duplex scan.   I discussed in depth with the patient the nature of atherosclerosis, and emphasized the importance of maximal medical management including strict control of blood pressure, blood glucose, and lipid levels, obtaining regular exercise, and continued cessation of smoking.  The patient is aware that without maximal medical management the underlying atherosclerotic disease process will progress, limiting the benefit of any interventions. The patient was given information about stroke prevention and what symptoms should prompt the patient to seek immediate medical care. Thank you for allowing [place] to participate in this patient's care.  Charisse March, RN, MSN, FNP-C [facility] Office: [phone]  Clinic Physician: Imogene Burn  06/14/2013 9:28 AM

## 2013-07-03 ENCOUNTER — Ambulatory Visit (INDEPENDENT_AMBULATORY_CARE_PROVIDER_SITE_OTHER): Payer: Medicare Other | Admitting: Cardiology

## 2013-07-03 ENCOUNTER — Encounter: Payer: Self-pay | Admitting: Cardiology

## 2013-07-03 VITALS — BP 114/80 | HR 69 | Ht 60.0 in | Wt 169.0 lb

## 2013-07-03 DIAGNOSIS — I359 Nonrheumatic aortic valve disorder, unspecified: Secondary | ICD-10-CM

## 2013-07-03 DIAGNOSIS — R112 Nausea with vomiting, unspecified: Secondary | ICD-10-CM

## 2013-07-03 DIAGNOSIS — I350 Nonrheumatic aortic (valve) stenosis: Secondary | ICD-10-CM

## 2013-07-03 DIAGNOSIS — R634 Abnormal weight loss: Secondary | ICD-10-CM | POA: Insufficient documentation

## 2013-07-03 LAB — CBC WITH DIFFERENTIAL/PLATELET
Basophils Relative: 0.3 % (ref 0.0–3.0)
Hemoglobin: 13.5 g/dL (ref 12.0–15.0)
Lymphocytes Relative: 35.1 % (ref 12.0–46.0)
MCV: 93.6 fl (ref 78.0–100.0)
Monocytes Relative: 8.7 % (ref 3.0–12.0)
Neutro Abs: 4.6 10*3/uL (ref 1.4–7.7)
Platelets: 268 10*3/uL (ref 150.0–400.0)
RBC: 4.32 Mil/uL (ref 3.87–5.11)
WBC: 9.2 10*3/uL (ref 4.5–10.5)

## 2013-07-03 LAB — BASIC METABOLIC PANEL
CO2: 28 mEq/L (ref 19–32)
Calcium: 9.4 mg/dL (ref 8.4–10.5)
Creatinine, Ser: 0.8 mg/dL (ref 0.4–1.2)
GFR: 80.81 mL/min (ref >60.00)
Glucose, Bld: 97 mg/dL (ref 70–99)
Sodium: 138 mEq/L (ref 135–145)

## 2013-07-03 LAB — HEPATIC FUNCTION PANEL
AST: 28 U/L (ref 0–37)
Albumin: 3.9 g/dL (ref 3.5–5.2)
Alkaline Phosphatase: 70 U/L (ref 39–117)
Total Protein: 7 g/dL (ref 6.0–8.3)

## 2013-07-03 LAB — SEDIMENTATION RATE: Sed Rate: 20 mm/hr (ref 0–22)

## 2013-07-03 LAB — T4, FREE: Free T4: 0.81 ng/dL (ref 0.60–1.60)

## 2013-07-03 NOTE — Patient Instructions (Signed)
Will obtain labs today and call you with the results (cbc/bmet/hfp/tsh/ft4/sed rate)  You need to go soon for a chest Xray to the [facility] across from [facility]  Your physician recommends that you schedule a follow-up appointment in: with Dr Eden Emms in 2-3 weeks

## 2013-07-03 NOTE — Progress Notes (Signed)
Sophia Suarez Date of Birth:  18-May-1941 [address] [place], [place]  [address] [phone]         Fax   [phone]  History of Present Illness: This pleasant 72 year old woman is seen as a DOD work in visit.  She is seen at the request of Dr. Allegra Grana who saw her earlier today for complaints of feeling that her whole head was lowering inside like an angina.  She also has intermittent left ear discomfort.  Dr. Pollyann Kennedy was concerned that she might be having some cardiac explanation for her symptoms.  She states that she has been having intermittent nausea and vomiting associated with severe vertigo for the past 3 weeks.  These episodes occur without warning every 2 or 3 days.  They never occur with activity.  They only occur when she is sedentary or in bed she has not been experiencing any precordial chest pain to suggest angina.  She does not have any history of ischemic heart disease.  She has had an unexplained 12 pound weight loss in the past 6 weeks.  She has not been experiencing any unusual exertional dyspnea.  She has not been aware of her heart racing or pounding.  She has had some symptoms of acid reflux.  She has not had a recent chest x-ray.  She denies any cough or sputum production.  She has not had fever or chills.  She does have a past history of moderate left carotid artery stenosis not severe enough to require surgery.  She is on aspirin and Plavix.  Current Outpatient Prescriptions  Medication Sig Dispense Refill  . albuterol (PROAIR HFA) 108 (90 BASE) MCG/ACT inhaler Inhale 2 puffs into the lungs every 6 (six) hours as needed.      Marland Kitchen aspirin 81 MG tablet Take 81 mg by mouth daily.      Marland Kitchen BIOTIN PO Take 2,000 mg by mouth daily.      . Cetirizine HCl (ZYRTEC ALLERGY) 10 MG CAPS Take 1 capsule by mouth daily.      . clopidogrel (PLAVIX) 75 MG tablet Take 1 tablet (75 mg total) by mouth daily.  90 tablet  1  . Cyanocobalamin (VITAMIN B-12 PO) Take by  mouth daily.      . diphenhydrAMINE (BENADRYL) 50 MG capsule Take 50 mg by mouth every 6 (six) hours as needed. Pt states she takes 2 tabs at night and 2 tabs in the morning and maybe one tab in the evening      . Docusate Calcium (STOOL SOFTENER PO) Take by mouth.      . EPINEPHrine (EPI-PEN) 0.3 mg/0.3 mL DEVI Inject 0.3 mLs (0.3 mg total) into the muscle once.  1 Device  1  . fish oil-omega-3 fatty acids 1000 MG capsule Take 2 g by mouth 2 (two) times daily. Taking 3 capsules in the am and 2 @ pm      . Glucosamine-Chondroitin (GLUCOSAMINE CHONDR COMPLEX) 500-400 MG CAPS Take 1,000 mg by mouth 2 (two) times daily. Pt states on rainy days she takes 3 tablets and on regular days she takes 2 tablets      . hydrochlorothiazide (MICROZIDE) 12.5 MG capsule Take 1 capsule (12.5 mg total) by mouth daily.  90 capsule  3  . losartan (COZAAR) 100 MG tablet Take 100 mg by mouth daily.       . meclizine (ANTIVERT) 25 MG tablet Take 25 mg by mouth as needed.      Marland Kitchen  meloxicam (MOBIC) 15 MG tablet Take 15 mg by mouth daily.      . Multiple Vitamin (MULTIVITAMIN) capsule Take 1 capsule by mouth daily.      Marland Kitchen omeprazole (PRILOSEC) 20 MG capsule Take 20 mg by mouth 2 (two) times daily.       Bertram Gala Glycol-Propyl Glycol (SYSTANE OP) Apply to eye as directed.      . simvastatin (ZOCOR) 40 MG tablet Take 40 mg by mouth every evening.      . Travoprost, BAK Free, (TRAVATAN) 0.004 % SOLN ophthalmic solution 1 drop as directed.      . Undecylenic Ac-Zn Undecylenate (FUNGI-NAIL TOE & FOOT EX) Apply topically 2 (two) times daily.       No current facility-administered medications for this visit.    Allergies  Allergen Reactions  . Clindamycin/Lincomycin Anaphylaxis  . Septra [Sulfamethoxazole-Tmp Ds] Anaphylaxis  . Sulfa Antibiotics Anaphylaxis  . Horse-Derived Products   . Mold Extract [Trichophyton]   . Pollen Extract     Patient Active Problem List   Diagnosis Date Noted  . Weight loss,  non-intentional 07/03/2013  . Aortic stenosis 05/21/2013  . Occlusion and stenosis of carotid artery without mention of cerebral infarction 06/15/2012  . Hypertension   . Hyperlipidemia   . GERD (gastroesophageal reflux disease)   . Diverticulitis   . Chronic pain   . Lumbago   . Heart murmur   . Panic attacks   . Stroke   . Allergic rhinitis, seasonal   . Carotid artery stenosis, right 11/18/2011    History  Smoking status  . Never Smoker   Smokeless tobacco  . Never Used    History  Alcohol Use  . 0 - .5 oz/week  . 0-1 drink(s) per week    Comment: occasionally; 1 drink monthly    Family History  Problem Relation Age of Onset  . Aneurysm Mother     aortic  . Coronary artery disease Mother     CABG  . Cancer Mother   . Diabetes Mother   . Heart disease Mother     AAA  . Hypertension Mother   . Cancer Father     Brain  . Hypertension Father   . Deep vein thrombosis Father   . Hypertension Brother   . Hypertension Son     Review of Systems: Constitutional: no fever chills diaphoresis or fatigue or change in weight.  Head and neck: Positive for vertigo and a roaring sensation in her head Respiratory: No cough, shortness of breath or wheezing. Cardiovascular: No chest pain peripheral edema, palpitations. Gastrointestinal: No abdominal distention, no abdominal pain, no change in bowel habits hematochezia or melena. Genitourinary: No dysuria, no frequency, no urgency, no nocturia. Musculoskeletal:No arthralgias, no back pain, no gait disturbance or myalgias. Neurological: No dizziness, no headaches, no numbness, no seizures, no syncope, no weakness, no tremors. Hematologic: No lymphadenopathy, no easy bruising. Psychiatric: No confusion, no hallucinations, no sleep disturbance.    Physical Exam: Filed Vitals:   07/03/13 1036  BP: 114/80  Pulse: 69   the general appearance reveals a well-developed well-nourished woman in no distress.The head and neck exam  reveals pupils equal and reactive.  Extraocular movements are full.  There is no scleral icterus.  The mouth and pharynx are normal.  The neck is supple.  The carotids reveal no bruits.  The jugular venous pressure is normal.  The  thyroid is not enlarged.  There is no lymphadenopathy.  The chest is clear  to percussion and auscultation.  There are no rales or rhonchi.  Expansion of the chest is symmetrical.  The precordium is quiet.  The first heart sound is normal.  The second heart sound is physiologically split.  There is a grade 2/6 systolic ejection murmur of mild aortic stenosis at the base. There is no abnormal lift or heave.  The abdomen is soft and nontender.  The bowel sounds are normal.  The liver and spleen are not enlarged.  There are no abdominal masses.  There are no abdominal bruits.  Extremities reveal good pedal pulses.  There is no phlebitis or edema.  There is no cyanosis or clubbing.  Strength is normal and symmetrical in all extremities.  There is no lateralizing weakness.  There are no sensory deficits.  The skin is warm and dry.  There is no rash.  EKG shows normal sinus rhythm and is within normal limits.   Assessment / Plan: The etiology of her symptom complex is not clear at the present time.  No definite cardiac etiology is found at this time.  She has mild aortic stenosis which would not be causing these symptoms.  Her nonintentional weight loss is unexplained and may be secondary to her repeated episodes of nausea and vomiting.  These are associated with symptoms of vertigo and she is on meclizine 3 times a day. Today we will request a chest x-ray and we will get fasting lab work including CBC comprehensive metabolic panel TSH free T4 and a sedimentation rate.  We will see if this yields any helpful information.  No new medication was prescribed.  If her vomiting persists she might benefit from a GI consultation. I will ask her to followup with Dr. Eden Emms for her heart in about  2 or 3 weeks. Many thanks for the opportunity to see this pleasant woman with you

## 2013-07-09 ENCOUNTER — Telehealth: Payer: Self-pay | Admitting: *Deleted

## 2013-07-09 ENCOUNTER — Ambulatory Visit (INDEPENDENT_AMBULATORY_CARE_PROVIDER_SITE_OTHER)
Admission: RE | Admit: 2013-07-09 | Discharge: 2013-07-09 | Disposition: A | Discharge status: Home / Self Care | Payer: Medicare Other | Source: Ambulatory Visit | Attending: Cardiology | Admitting: Cardiology

## 2013-07-09 DIAGNOSIS — R112 Nausea with vomiting, unspecified: Secondary | ICD-10-CM

## 2013-07-09 DIAGNOSIS — I359 Nonrheumatic aortic valve disorder, unspecified: Secondary | ICD-10-CM

## 2013-07-09 DIAGNOSIS — I350 Nonrheumatic aortic (valve) stenosis: Secondary | ICD-10-CM

## 2013-07-09 DIAGNOSIS — R634 Abnormal weight loss: Secondary | ICD-10-CM

## 2013-07-09 NOTE — Telephone Encounter (Signed)
Advised patient of lab results and sent to PCP 

## 2013-07-09 NOTE — Telephone Encounter (Signed)
Message copied by Burnell Blanks on Tue Jul 09, 2013  6:13 PM ------      Message from: Cassell Clement      Created: Tue Jul 09, 2013  4:27 PM       Xray was normal.  Please report. ------

## 2013-07-09 NOTE — Telephone Encounter (Signed)
Message copied by Burnell Blanks on Tue Jul 09, 2013 10:15 AM ------      Message from: Cassell Clement      Created: Sun Jul 07, 2013  7:17 PM       Please report.  Blood tests are normal. No cause for weight loss found. ------

## 2013-07-09 NOTE — Telephone Encounter (Signed)
Advised patient

## 2013-07-23 ENCOUNTER — Encounter: Payer: Self-pay | Admitting: Cardiovascular Disease

## 2013-07-23 ENCOUNTER — Ambulatory Visit (INDEPENDENT_AMBULATORY_CARE_PROVIDER_SITE_OTHER): Payer: Medicare Other | Admitting: Cardiovascular Disease

## 2013-07-23 VITALS — BP 132/77 | HR 72 | Ht 60.0 in | Wt 172.0 lb

## 2013-07-23 DIAGNOSIS — I6529 Occlusion and stenosis of unspecified carotid artery: Secondary | ICD-10-CM

## 2013-07-23 DIAGNOSIS — Z01818 Encounter for other preprocedural examination: Secondary | ICD-10-CM

## 2013-07-23 DIAGNOSIS — I359 Nonrheumatic aortic valve disorder, unspecified: Secondary | ICD-10-CM

## 2013-07-23 DIAGNOSIS — Z01810 Encounter for preprocedural cardiovascular examination: Secondary | ICD-10-CM

## 2013-07-23 DIAGNOSIS — I350 Nonrheumatic aortic (valve) stenosis: Secondary | ICD-10-CM

## 2013-07-23 DIAGNOSIS — Z01811 Encounter for preprocedural respiratory examination: Secondary | ICD-10-CM

## 2013-07-23 NOTE — Assessment & Plan Note (Signed)
F/U Dr Imogene Burnhen 6 months wit duplex Now on ASA and plavix

## 2013-07-23 NOTE — Assessment & Plan Note (Signed)
She has had progression of her carotid disease on right Will do lexiscan myovue to clear for possible upcoming surgery.  Had dense calcium and plaque in carotids and 30% association of sig CAD with this sort of PV disease

## 2013-07-23 NOTE — Progress Notes (Signed)
Patient ID: Sophia Suarez, female   DOB: May 25, 1941, 73 y.o.   MRN: [id number] 73 yo seen by me 5/14  . Has had TIAls with borderline carotid lesion. Sees Dr Marjory LiesPenumalli as well. Reviewed CTA from 4/26 and RICA has a ? Of 55-60% stenosis not typically bad enough to operate on. Had a F/U MRI last Thursday Results not available. Over last 6 months has had multople episodes of left sided paresthesia Not clearly TIA;s and ? Migraine, vascular headaches or neuralgia No history of CAD. Active with no chest pain. Long standing murmur. Echo done 11/10/11 reviewed  Study Conclusions  - Left ventricle: The cavity size was normal. Wall thickness was increased in a pattern of mild LVH. Systolic function was vigorous. The estimated ejection fraction was in the range of 65% to 70%. Wall motion was normal; there were no regional wall motion abnormalities. Doppler parameters are consistent with abnormal left ventricular relaxation (grade 1 diastolic dysfunction). - Aortic valve: There was very mild stenosis. Mean gradient: 9mm Hg (S). Peak gradient: 14mm Hg (S). - Atrial septum: No defect or patent foramen ovale was identified.  CRF; Type 2 DM, elevated lipids and HTN all well Rx  Has edema and BP not ideally controlled Having trouble sleeping Cannot take sulfa based diuretics and melatonin   12/24 saw Dr Pollyann Kennedyosen and had vertigo Seen by Dr Patty SermonsBrackbill who did not think anything was going on with her heart Turned out she had glaucoma and has seen Dr Burnett Shenghman Occular pressure down with drops and feels much better  Had duplex with Imogene Burnhen and worse but still not wanting to do surgery   ROS: Denies fever, malais, weight loss, blurry vision, decreased visual acuity, cough, sputum, SOB, hemoptysis, pleuritic pain, palpitaitons, heartburn, abdominal pain, melena, lower extremity edema, claudication, or rash.  All other systems reviewed and negative  General: Affect appropriate Chronically ill female  HEENT: normal Neck  supple with no adenopathy JVP normal bilateral bruits no thyromegaly Lungs clear with no wheezing and good diaphragmatic motion Heart:  S1/S2 midl AS murmur, no rub, gallop or click PMI normal Abdomen: benighn, BS positve, no tenderness, no AAA no bruit.  No HSM or HJR Distal pulses intact with no bruits No edema Neuro non-focal Skin warm and dry Unstable gate    Current Outpatient Prescriptions  Medication Sig Dispense Refill  . albuterol (PROAIR HFA) 108 (90 BASE) MCG/ACT inhaler Inhale 2 puffs into the lungs every 6 (six) hours as needed.      Marland Kitchen. aspirin 81 MG tablet Take 81 mg by mouth daily.      Marland Kitchen. BIOTIN PO Take 2,000 mg by mouth daily.      . brimonidine (ALPHAGAN) 0.15 % ophthalmic solution 1 drop 3 (three) times daily.      . Cetirizine HCl (ZYRTEC ALLERGY) 10 MG CAPS Take 1 capsule by mouth daily.      . clopidogrel (PLAVIX) 75 MG tablet Take 1 tablet (75 mg total) by mouth daily.  90 tablet  1  . Cyanocobalamin (VITAMIN B-12 PO) Take by mouth daily.      . diphenhydrAMINE (BENADRYL) 50 MG capsule Take 50 mg by mouth every 6 (six) hours as needed. Pt states she takes 2 tabs at night and 2 tabs in the morning and maybe one tab in the evening      . Docusate Calcium (STOOL SOFTENER PO) Take by mouth.      . EPINEPHrine (EPI-PEN) 0.3 mg/0.3 mL DEVI Inject 0.3 mLs (0.3  mg total) into the muscle once.  1 Device  1  . fish oil-omega-3 fatty acids 1000 MG capsule Take 2 g by mouth 2 (two) times daily. Taking 3 capsules in the am and 2 @ pm      . Glucosamine-Chondroitin (GLUCOSAMINE CHONDR COMPLEX) 500-400 MG CAPS Take 1,000 mg by mouth 2 (two) times daily. Pt states on rainy days she takes 3 tablets and on regular days she takes 2 tablets      . hydrochlorothiazide (MICROZIDE) 12.5 MG capsule Take 1 capsule (12.5 mg total) by mouth daily.  90 capsule  3  . losartan (COZAAR) 100 MG tablet Take 100 mg by mouth daily.       . meclizine (ANTIVERT) 25 MG tablet Take 25 mg by mouth as  needed.      . meloxicam (MOBIC) 15 MG tablet Take 15 mg by mouth daily.      . Multiple Vitamin (MULTIVITAMIN) capsule Take 1 capsule by mouth daily.      Marland Kitchen omeprazole (PRILOSEC) 20 MG capsule Take 20 mg by mouth 2 (two) times daily.       Bertram Gala Glycol-Propyl Glycol (SYSTANE OP) Apply to eye as directed.      . simvastatin (ZOCOR) 40 MG tablet Take 40 mg by mouth every evening.      . Travoprost, BAK Free, (TRAVATAN) 0.004 % SOLN ophthalmic solution 1 drop as directed.      . Undecylenic Ac-Zn Undecylenate (FUNGI-NAIL TOE & FOOT EX) Apply topically 2 (two) times daily.       No current facility-administered medications for this visit.    Allergies  Clindamycin/lincomycin; Septra; Sulfa antibiotics; Horse-derived products; Mold extract; and Pollen extract  Electrocardiogram:  SR rate 69 normal   Assessment and Plan

## 2013-07-23 NOTE — Assessment & Plan Note (Signed)
Well controlled.  Continue current medications and low sodium Dash type diet.    

## 2013-07-23 NOTE — Assessment & Plan Note (Signed)
Mild stenosis in 2013  Mean gradient 9mmHg F/U echo next year

## 2013-07-23 NOTE — Patient Instructions (Signed)
Your physician wants you to follow-up in:   6 MONTHS  WITH  DR Haywood FillerNISHAN  You will receive a reminder letter in the mail two months in advance. If you don't receive a letter, please call our office to schedule the follow-up appointment. Your physician recommends that you continue on your current medications as directed. Please refer to the Current Medication list given to you today.  Your physician has requested that you have a lexiscan myoview. For further information please visit [email or web address]. Please follow instruction sheet, as given. IN  4 WEEKS

## 2013-08-20 ENCOUNTER — Ambulatory Visit (HOSPITAL_COMMUNITY): Payer: Medicare Other | Attending: Cardiovascular Disease | Admitting: Radiology

## 2013-08-20 VITALS — BP 142/69 | HR 67 | Ht 60.0 in | Wt 170.0 lb

## 2013-08-20 DIAGNOSIS — Z01818 Encounter for other preprocedural examination: Secondary | ICD-10-CM

## 2013-08-20 DIAGNOSIS — E785 Hyperlipidemia, unspecified: Secondary | ICD-10-CM | POA: Insufficient documentation

## 2013-08-20 DIAGNOSIS — Z8673 Personal history of transient ischemic attack (TIA), and cerebral infarction without residual deficits: Secondary | ICD-10-CM | POA: Insufficient documentation

## 2013-08-20 DIAGNOSIS — R209 Unspecified disturbances of skin sensation: Secondary | ICD-10-CM | POA: Insufficient documentation

## 2013-08-20 DIAGNOSIS — I779 Disorder of arteries and arterioles, unspecified: Secondary | ICD-10-CM | POA: Insufficient documentation

## 2013-08-20 DIAGNOSIS — I10 Essential (primary) hypertension: Secondary | ICD-10-CM | POA: Insufficient documentation

## 2013-08-20 DIAGNOSIS — R42 Dizziness and giddiness: Secondary | ICD-10-CM | POA: Insufficient documentation

## 2013-08-20 DIAGNOSIS — R0602 Shortness of breath: Secondary | ICD-10-CM | POA: Insufficient documentation

## 2013-08-20 MED ORDER — TECHNETIUM TC 99M SESTAMIBI GENERIC - CARDIOLITE
30.0000 | Freq: Once | INTRAVENOUS | Status: AC | PRN
  Administered 2013-08-20: 30 via INTRAVENOUS

## 2013-08-20 MED ORDER — TECHNETIUM TC 99M SESTAMIBI GENERIC - CARDIOLITE
10.0000 | Freq: Once | INTRAVENOUS | Status: AC | PRN
  Administered 2013-08-20: 10 via INTRAVENOUS

## 2013-08-20 MED ORDER — AMINOPHYLLINE 25 MG/ML IV SOLN
75.0000 mg | Freq: Once | INTRAVENOUS | Status: AC
  Administered 2013-08-20: 75 mg via INTRAVENOUS

## 2013-08-20 MED ORDER — REGADENOSON 0.4 MG/5ML IV SOLN
0.4000 mg | Freq: Once | INTRAVENOUS | Status: AC
  Administered 2013-08-20: 0.4 mg via INTRAVENOUS

## 2013-08-20 NOTE — Progress Notes (Signed)
[facility] SITE [facility] N[facility] [address] [place], [place] [address] [id number]    Cardiology Nuclear Med Study  Sophia Suarez is a 73 y.o. female     MRN : [id number]     DOB: 1940/09/16  Procedure Date: 08/20/2013  Nuclear Med Background Indication for Stress Test:  Evaluation for Ischemia and possible carotid surgery History:  No known CAD, Echo 2013 EF 65-70% Cardiac Risk Factors: Carotid Disease, Hypertension, Lipids and TIA  Symptoms:  Dizziness, SOB and left arm numbness   Nuclear Pre-Procedure Caffeine/Decaff Intake:  none NPO After: 8:00pm   Lungs:  clear O2 Sat: 98% on room air. IV 0.9% NS with Angio Cath:  22g  IV Site: R Antecubital  IV Started by:  Milana NaSabrina Williams, EMT-P  Chest Size (in):  38 Cup Size: C  Height: 5' (1.524 m)  Weight:  170 lb (77.111 kg)  BMI:  Body mass index is 33.2 kg/(m^2). Tech Comments:  n/a    Nuclear Med Study 1 or 2 day study: 1 day  Stress Test Type:  Lexiscan  Reading MD: n/a  Order Authorizing Provider:  Charlton HawsPeter Saw Mendenhall, MD  Resting Radionuclide: Technetium 742m Sestamibi  Resting Radionuclide Dose: 11.0 mCi   Stress Radionuclide:  Technetium 622m Sestamibi  Stress Radionuclide Dose: 33.0 mCi           Stress Protocol Rest HR: 67 Stress HR: 91  Rest BP: 142/69 Stress BP: 110/95  Exercise Time (min): n/a METS: n/a   Predicted Max HR: 149 bpm % Max HR: 61.07 bpm Rate Pressure Product: [id number]   Dose of Adenosine (mg):  n/a Dose of Lexiscan: 0.4 mg  Dose of Atropine (mg): n/a Dose of Dobutamine: n/a mcg/kg/min (at max HR)  Stress Test Technologist: Nelson ChimesSharon Brooks, BS-ES  Nuclear Technologist:  Domenic PoliteStephen Carbone, CNMT     Rest Procedure:  Myocardial perfusion imaging was performed at rest 45 minutes following the intravenous administration of Technetium 522m Sestamibi. Rest ECG: NSR with non-specific ST-T wave changes  Stress Procedure:  The patient received IV Lexiscan 0.4 mg over 15-seconds.  Technetium 552m  Sestamibi injected at 30-seconds.  Quantitative spect images were obtained after a 45 minute delay.  During the infusion of Lexiscan, the patient complained of SOB, headache, tingling in both arms and left side of face tingling.  This did not resolve.  Therefore, 75mg  aminophylline was given by Edwyna Perfectindy Hasspacher, RN.  Her symptoms began to resolve within a few minutes.   Stress ECG: No significant change from baseline ECG  QPS Raw Data Images:  Patient motion noted. Stress Images:  Normal homogeneous uptake in all areas of the myocardium. Rest Images:  Normal homogeneous uptake in all areas of the myocardium. Subtraction (SDS):  Normal Transient Ischemic Dilatation (Normal <1.22):  1.10 Lung/Heart Ratio (Normal <0.45):  0.25  Quantitative Gated Spect Images QGS EDV:  68 ml QGS ESV:  23 ml  Impression Exercise Capacity:  Lexiscan with no exercise. BP Response:  Hypotensive blood pressure response. Clinical Symptoms:  Dyspnea headache Aminophylline given ECG Impression:  No significant ST segment change suggestive of ischemia. Comparison with Prior Nuclear Study: No images to compare  Overall Impression:  Normal stress nuclear study.  LV Ejection Fraction: 67%.  LV Wall Motion:  NL LV Function; NL Wall Motion   Charlton HawsPeter Calah Gershman

## 2013-09-23 ENCOUNTER — Other Ambulatory Visit: Payer: Self-pay | Admitting: Family Medicine

## 2013-09-23 DIAGNOSIS — R946 Abnormal results of thyroid function studies: Secondary | ICD-10-CM

## 2013-09-24 ENCOUNTER — Other Ambulatory Visit: Payer: Self-pay

## 2013-09-24 MED ORDER — HYDROCHLOROTHIAZIDE 12.5 MG PO CAPS: 12.5000 mg | ORAL_CAPSULE | Freq: Every day | ORAL | Status: DC

## 2013-09-25 ENCOUNTER — Ambulatory Visit
Admission: RE | Admit: 2013-09-25 | Discharge: 2013-09-25 | Disposition: A | Discharge status: Home / Self Care | Payer: Medicare Other | Source: Ambulatory Visit | Attending: Family Medicine | Admitting: Family Medicine

## 2013-09-25 DIAGNOSIS — R946 Abnormal results of thyroid function studies: Secondary | ICD-10-CM

## 2013-09-26 ENCOUNTER — Encounter (INDEPENDENT_AMBULATORY_CARE_PROVIDER_SITE_OTHER): Payer: Self-pay | Admitting: Surgery

## 2013-10-08 ENCOUNTER — Ambulatory Visit (INDEPENDENT_AMBULATORY_CARE_PROVIDER_SITE_OTHER): Payer: Medicare Other | Admitting: Surgery

## 2013-10-08 ENCOUNTER — Other Ambulatory Visit (INDEPENDENT_AMBULATORY_CARE_PROVIDER_SITE_OTHER): Payer: Self-pay | Admitting: Surgery

## 2013-10-08 ENCOUNTER — Encounter (INDEPENDENT_AMBULATORY_CARE_PROVIDER_SITE_OTHER): Payer: Self-pay | Admitting: Surgery

## 2013-10-08 VITALS — BP 128/74 | HR 77 | Temp 97.4°F | Resp 16 | Ht 60.0 in | Wt 171.8 lb

## 2013-10-08 DIAGNOSIS — E041 Nontoxic single thyroid nodule: Secondary | ICD-10-CM

## 2013-10-08 NOTE — Progress Notes (Signed)
Patient ID: Sophia Suarez, female   DOB: 02-16-1941, 73 y.o.   MRN: [id number]  Chief Complaint  Patient presents with  . Thyroid Nodule    new pt    HPI Sophia Suarez is a 73 y.o. female.   HPI This patient is referred to me by Dr. Mila Palmer for evaluation of a recently diagnosed right thyroid nodule. She's been having a lot of symptoms of weight loss vertigo and other symptoms and during the workup was found to have elevated thyroid function tests. An ultrasound was then performed showing a right thyroid nodule. She has no complaints regarding her neck. She may have some mild difficulty swallowing per her report secondary to her tonsils. She is otherwise without complaints Past Medical History  Diagnosis Date  . Hypertension   . Diabetes mellitus   . Hyperlipidemia   . Carotid artery occlusion   . GERD (gastroesophageal reflux disease)   . Asthma   . Diverticulitis   . Chronic pain   . Lumbago   . Heart murmur   . Panic attacks   . Stroke   . Allergic rhinitis, seasonal   . Anemia     Past Surgical History  Procedure Laterality Date  . Appendectomy    . Ovarian cyst surgery    . Partial hysterectomy    . Colectomy      extent uncertain  . Abdominal hysterectomy      Family History  Problem Relation Age of Onset  . Aneurysm Mother     aortic  . Coronary artery disease Mother     CABG  . Cancer Mother   . Diabetes Mother   . Heart disease Mother     AAA  . Hypertension Mother   . Cancer Father     Brain  . Hypertension Father   . Deep vein thrombosis Father   . Hypertension Brother   . Hypertension Son     Social History History  Substance Use Topics  . Smoking status: Never Smoker   . Smokeless tobacco: Never Used  . Alcohol Use: 0 - .5 oz/week    0-1 drink(s) per week     Comment: occasionally; 1 drink monthly    Allergies  Allergen Reactions  . Clindamycin/Lincomycin Anaphylaxis  . Septra [Sulfamethoxazole-Tmp Ds] Anaphylaxis  .  Sulfa Antibiotics Anaphylaxis  . Horse-Derived Products   . Mold Extract [Trichophyton]   . Pollen Extract     Current Outpatient Prescriptions  Medication Sig Dispense Refill  . albuterol (PROAIR HFA) 108 (90 BASE) MCG/ACT inhaler Inhale 2 puffs into the lungs every 6 (six) hours as needed.      Marland Kitchen aspirin 81 MG tablet Take 81 mg by mouth daily.      Marland Kitchen BIOTIN PO Take 2,000 mg by mouth daily.      . brimonidine (ALPHAGAN) 0.15 % ophthalmic solution 1 drop 3 (three) times daily.      . Cetirizine HCl (ZYRTEC ALLERGY) 10 MG CAPS Take 1 capsule by mouth daily.      . clopidogrel (PLAVIX) 75 MG tablet Take 1 tablet (75 mg total) by mouth daily.  90 tablet  1  . Cyanocobalamin (VITAMIN B-12 PO) Take by mouth daily.      . diphenhydrAMINE (BENADRYL) 50 MG capsule Take 50 mg by mouth every 6 (six) hours as needed. Pt states she takes 2 tabs at night and 2 tabs in the morning and maybe one tab in the evening      .  Docusate Calcium (STOOL SOFTENER PO) Take by mouth.      . EPINEPHrine (EPI-PEN) 0.3 mg/0.3 mL DEVI Inject 0.3 mLs (0.3 mg total) into the muscle once.  1 Device  1  . fish oil-omega-3 fatty acids 1000 MG capsule Take 2 g by mouth 2 (two) times daily. Taking 3 capsules in the am and 2 @ pm      . Glucosamine-Chondroitin (GLUCOSAMINE CHONDR COMPLEX) 500-400 MG CAPS Take 1,000 mg by mouth 2 (two) times daily. Pt states on rainy days she takes 3 tablets and on regular days she takes 2 tablets      . hydrochlorothiazide (MICROZIDE) 12.5 MG capsule Take 1 capsule (12.5 mg total) by mouth daily.  90 capsule  3  . losartan (COZAAR) 100 MG tablet Take 100 mg by mouth daily.       . meclizine (ANTIVERT) 25 MG tablet Take 25 mg by mouth as needed.      . meloxicam (MOBIC) 15 MG tablet Take 15 mg by mouth daily.      . Multiple Vitamin (MULTIVITAMIN) capsule Take 1 capsule by mouth daily.      Marland Kitchen omeprazole (PRILOSEC) 20 MG capsule Take 20 mg by mouth 2 (two) times daily.       Bertram Gala  Glycol-Propyl Glycol (SYSTANE OP) Apply to eye as directed.      . simvastatin (ZOCOR) 40 MG tablet Take 40 mg by mouth every evening.      . Travoprost, BAK Free, (TRAVATAN) 0.004 % SOLN ophthalmic solution 1 drop as directed.      . Undecylenic Ac-Zn Undecylenate (FUNGI-NAIL TOE & FOOT EX) Apply topically 2 (two) times daily.       No current facility-administered medications for this visit.    Review of Systems Review of Systems  Constitutional: Negative for fever, chills and unexpected weight change.  HENT: Positive for postnasal drip. Negative for congestion, hearing loss, sore throat, trouble swallowing and voice change.   Eyes: Negative for visual disturbance.  Respiratory: Negative for cough and wheezing.   Cardiovascular: Positive for leg swelling. Negative for chest pain and palpitations.  Gastrointestinal: Positive for nausea. Negative for vomiting, abdominal pain, diarrhea, constipation, blood in stool, abdominal distention and anal bleeding.  Genitourinary: Negative for hematuria, vaginal bleeding and difficulty urinating.  Musculoskeletal: Positive for arthralgias.  Skin: Negative for rash and wound.  Neurological: Negative for seizures, syncope and headaches.  Hematological: Negative for adenopathy. Bruises/bleeds easily.  Psychiatric/Behavioral: Negative for confusion.    Blood pressure 128/74, pulse 77, temperature 97.4 F (36.3 C), temperature source Oral, resp. rate 16, height 5' (1.524 m), weight 171 lb 12.8 oz (77.928 kg).  Physical Exam Physical Exam  Constitutional: She is oriented to person, place, and time. She appears well-developed. No distress.  obese  HENT:  Head: Normocephalic and atraumatic.  Right Ear: External ear normal.  Left Ear: External ear normal.  Nose: Nose normal.  Mouth/Throat: Oropharynx is clear and moist. No oropharyngeal exudate.  Eyes: Conjunctivae are normal. Pupils are equal, round, and reactive to light. Right eye exhibits no  discharge. Left eye exhibits no discharge. No scleral icterus.  Neck: Normal range of motion. No tracheal deviation present. No thyromegaly present.  i can not feel the thyroid nodule  Cardiovascular: Normal rate, regular rhythm, normal heart sounds and intact distal pulses.   No murmur heard. Pulmonary/Chest: Effort normal and breath sounds normal. No respiratory distress. She has no wheezes. She has no rales.  Abdominal: Soft. Bowel sounds  are normal. She exhibits no distension. There is no tenderness. There is no rebound.  Musculoskeletal: Normal range of motion.  Lymphadenopathy:    She has no cervical adenopathy.  Neurological: She is alert and oriented to person, place, and time.  Skin: Skin is warm and dry. No rash noted. She is not diaphoretic. No erythema.  Psychiatric: Her behavior is normal. Judgment normal.    Data Reviewed I have her ultrasound demonstrating a 1.7 cm right thyroid lower pole nodule  Assessment    Right thyroid nodule     Plan    I discussed this with her in detail. She needs a fine-needle aspiration of the nodule for histologic evaluation to determine whether thyroidectomy or lobectomy as necessary. I will see her back after the fine-needle aspiration to discuss results        Godson Pollan A 10/08/2013, 10:54 AM

## 2013-10-23 ENCOUNTER — Ambulatory Visit
Admission: RE | Admit: 2013-10-23 | Discharge: 2013-10-23 | Disposition: A | Discharge status: Home / Self Care | Payer: Medicare Other | Source: Ambulatory Visit | Attending: Surgery | Admitting: Surgery

## 2013-10-23 ENCOUNTER — Other Ambulatory Visit (HOSPITAL_COMMUNITY)
Admission: RE | Admit: 2013-10-23 | Discharge: 2013-10-23 | Disposition: A | Discharge status: Home / Self Care | Payer: Medicare Other | Source: Ambulatory Visit | Attending: Interventional Radiology | Admitting: Interventional Radiology

## 2013-10-23 DIAGNOSIS — E041 Nontoxic single thyroid nodule: Secondary | ICD-10-CM

## 2013-11-13 ENCOUNTER — Encounter (INDEPENDENT_AMBULATORY_CARE_PROVIDER_SITE_OTHER): Payer: Self-pay | Admitting: Surgery

## 2013-11-13 ENCOUNTER — Ambulatory Visit (INDEPENDENT_AMBULATORY_CARE_PROVIDER_SITE_OTHER): Payer: Medicare Other | Admitting: Surgery

## 2013-11-13 VITALS — BP 110/60 | HR 65 | Temp 96.9°F | Resp 14 | Ht 60.0 in | Wt 173.0 lb

## 2013-11-13 DIAGNOSIS — E041 Nontoxic single thyroid nodule: Secondary | ICD-10-CM

## 2013-11-13 NOTE — Progress Notes (Signed)
Subjective:     Patient ID: Sophia Suarez, female   DOB: 1940-12-13, 73 y.o.   MRN: [id number]  HPI She is here for a followup status post fine-needle aspiration of the right thyroid nodule. She has no complaints today.  Review of Systems     Objective:   Physical Exam On exam, I can still not palpate the nodule. Her neck exam is otherwise normal.  The pathology on the FNA specimen showed only benign goiter with no evidence of malignancy    Assessment:     Right thyroid nodule     Plan:     As there is no evidence of malignancy and this is not appear to be a hyperfunctioning nodule, I believe we can hold on thyroid lobectomy. The patient would like this to be managed conservatively as well. It may be helpful to get another ultrasound in a year or 2 to document stability. I will see her as needed

## 2013-12-05 HISTORY — PX: BIOPSY THYROID: PRO38

## 2013-12-12 ENCOUNTER — Encounter: Payer: Self-pay | Admitting: Family

## 2013-12-13 ENCOUNTER — Ambulatory Visit (INDEPENDENT_AMBULATORY_CARE_PROVIDER_SITE_OTHER): Payer: Medicare Other | Admitting: Family

## 2013-12-13 ENCOUNTER — Encounter: Payer: Self-pay | Admitting: Family

## 2013-12-13 ENCOUNTER — Ambulatory Visit (HOSPITAL_COMMUNITY)
Admission: RE | Admit: 2013-12-13 | Discharge: 2013-12-13 | Disposition: A | Discharge status: Home / Self Care | Payer: Medicare Other | Source: Ambulatory Visit | Attending: Family | Admitting: Family

## 2013-12-13 VITALS — BP 122/70 | HR 58 | Resp 16 | Ht 60.0 in | Wt 170.5 lb

## 2013-12-13 DIAGNOSIS — I6529 Occlusion and stenosis of unspecified carotid artery: Secondary | ICD-10-CM

## 2013-12-13 DIAGNOSIS — I658 Occlusion and stenosis of other precerebral arteries: Secondary | ICD-10-CM | POA: Insufficient documentation

## 2013-12-13 NOTE — Progress Notes (Signed)
[facility] [place] HISTORY AND PHYSICAL -carotid artery stenosis  History of Present Illness Sophia Junioratricia M Suarez is a 73 y.o. female patient of Dr. Imogene Suarez who has known carotid stenosis. She returns today for routine surveillance.  Patient has not had previous carotid artery intervention.  Patient has Positive history of stroke about 2008 which manifested as dizziness, left facial drooping, left eye vision blurred, and left upper and lower extremity tingling and numbness, no weakness.   Patient denies residual effects from her stroke other than a drawing feeling in left side of face when she gets very tired.  Patient reports New Medical or Surgical History: is under treatment for sinus problems, saw her PCP and ENT. Was mostly bed-bound for a while due to severe vertigo and falling; this has since resolved.  Patient states has has a known heart murmur since her first pregnancy.  Also states she has severe OA in her back and left knee.  About 3 weeks ago she had drawing of the left side of her face and slurred speech that lasted about 15 minutes, denies loss of vision, denies hemiparesis; states she felt stressed at the time. Her neurologist, Dr. Oswaldo Suarez, told her that she had a TIA, but Dr. Imogene Suarez saw no evidence of this on imaging of brain.  Pt Diabetic: Yes, patient states in good control  Pt smoker: non-smoker  Pt meds include:  Statin : Yes  ASA: Yes  Other anticoagulants/antiplatelets: Plavix      Past Medical History  Diagnosis Date  . Hypertension   . Diabetes mellitus   . Hyperlipidemia   . Carotid artery occlusion   . GERD (gastroesophageal reflux disease)   . Asthma   . Diverticulitis   . Chronic pain   . Lumbago   . Heart murmur   . Panic attacks   . Stroke   . Allergic rhinitis, seasonal   . Anemia     Social History History  Substance Use Topics  . Smoking status: Never Smoker   . Smokeless tobacco: Never Used  . Alcohol Use: 0.0 - 0.5  oz/week    0-1 drink(s) per week     Comment: occasionally; 1 drink monthly    Family History Family History  Problem Relation Age of Onset  . Aneurysm Mother     aortic  . Coronary artery disease Mother     CABG  . Cancer Mother   . Diabetes Mother     Amputation  . Heart disease Mother     AAA  . Hypertension Mother   . Hyperlipidemia Mother   . Cancer Father     Brain  . Hypertension Father   . Deep vein thrombosis Father   . Heart disease Father   . Hyperlipidemia Father   . Hypertension Brother   . Hypertension Son     Past Surgical History  Procedure Laterality Date  . Ovarian cyst surgery    . Partial hysterectomy    . Biopsy thyroid  Dec 05, 2013    Tumor- Benign  . Colectomy  2003    extent uncertain  . Appendectomy  1958  . Abdominal hysterectomy  1973    Partial    Allergies  Allergen Reactions  . Clindamycin/Lincomycin Anaphylaxis  . Septra [Sulfamethoxazole-Tmp Ds] Anaphylaxis  . Sulfa Antibiotics Anaphylaxis  . Melatonin Other (See Comments) and Hypertension    Over active   . Horse-Derived Products   . Mold Extract [Trichophyton]   . Pollen Extract  Current Outpatient Prescriptions  Medication Sig Dispense Refill  . albuterol (PROAIR HFA) 108 (90 BASE) MCG/ACT inhaler Inhale 2 puffs into the lungs every 6 (six) hours as needed.      Marland Kitchen aspirin 81 MG tablet Take 81 mg by mouth daily.      Marland Kitchen BIOTIN PO Take 2,000 mg by mouth daily.      . brimonidine (ALPHAGAN) 0.15 % ophthalmic solution 1 drop 3 (three) times daily.      . clopidogrel (PLAVIX) 75 MG tablet Take 1 tablet (75 mg total) by mouth daily.  90 tablet  1  . Cyanocobalamin (VITAMIN B-12 PO) Take by mouth daily.      . diphenhydrAMINE (BENADRYL) 50 MG capsule Take 50 mg by mouth every 6 (six) hours as needed. Pt states she takes 2 tabs at night and 2 tabs in the morning and maybe one tab in the evening      . Docusate Calcium (STOOL SOFTENER PO) Take by mouth.      . EPINEPHrine  (EPI-PEN) 0.3 mg/0.3 mL DEVI Inject 0.3 mLs (0.3 mg total) into the muscle once.  1 Device  1  . fish oil-omega-3 fatty acids 1000 MG capsule Take 2 g by mouth 2 (two) times daily. Taking 3 capsules in the am and 2 @ pm      . Glucosamine-Chondroitin (GLUCOSAMINE CHONDR COMPLEX) 500-400 MG CAPS Take 1,000 mg by mouth 2 (two) times daily. Pt states on rainy days she takes 3 tablets and on regular days she takes 2 tablets      . hydrochlorothiazide (MICROZIDE) 12.5 MG capsule Take 1 capsule (12.5 mg total) by mouth daily.  90 capsule  3  . KRILL OIL PO Take by mouth daily.      Marland Kitchen losartan (COZAAR) 100 MG tablet Take 100 mg by mouth daily.       . meclizine (ANTIVERT) 25 MG tablet Take 25 mg by mouth as needed.      . meloxicam (MOBIC) 15 MG tablet Take 15 mg by mouth daily.      . Multiple Vitamin (MULTIVITAMIN) capsule Take 1 capsule by mouth daily.      Marland Kitchen omeprazole (PRILOSEC) 20 MG capsule Take 20 mg by mouth 2 (two) times daily.       Bertram Gala Glycol-Propyl Glycol (SYSTANE OP) Apply to eye as directed.      . simvastatin (ZOCOR) 40 MG tablet Take 40 mg by mouth every evening.      . Travoprost, BAK Free, (TRAVATAN) 0.004 % SOLN ophthalmic solution 1 drop as directed.       No current facility-administered medications for this visit.    ROS: See HPI for pertinent positives and negatives.   Physical Examination   Filed Vitals:   12/13/13 1104 12/13/13 1114  BP: 110/60 122/70  Pulse: 58 58  Resp:  16  Height:  5' (1.524 m)  Weight:  170 lb 8 oz (77.338 kg)  SpO2:  99%   Body mass index is 33.3 kg/(m^2).  General: WDWN obese female in NAD  GAIT: normal  Eyes: PERRLA  Pulmonary: CTAB, Negative Rales, Negative rhonchi, & Negative wheezing.  Cardiac: regular Rhythm , Positive mild Murmur.   VASCULAR EXAM  Carotid Bruits  Left  Right    Positive, mild negative   Radial pulses are 2+ palpable and equal.   LE Pulses  LEFT  RIGHT   POPLITEAL  palpable  palpable   POSTERIOR  TIBIAL  not palpable  not palpable   DORSALIS PEDIS  ANTERIOR TIBIAL  palpable  palpable    Gastrointestinal: soft, nontender, BS WNL, no r/g, negative masses.  Musculoskeletal: Negative muscle atrophy/wasting. M/S 5/5 throughout, Extremities without ischemic changes.  Neurologic: A&O X 3; Appropriate Affect ; SENSATION ;normal;  Speech is normal  CN 2-12 intact, Pain and light touch intact in extremities, Motor exam as listed above.    Non-Invasive Vascular Imaging: DATE: 12/13/2013  The velocities in the mid ICA suggest 40-59% right mid ICA stenosis (upper limits of range). The previous exam suggested just over the lower end of the 60-79% although there is <10 cm/sec difference in the PSV, EDV on today's exam. <40% left ICA stenosis. Bilateral vertebral arteries are antegrade.   ASSESSMENT: FEMALE IAFRATE is a 73 y.o. female who presents with: asymptomatic mild/moderate right ICA stenosis and minimal left ICA stenosis.  This seems to be a slight improvement in the right ICA with the left ICA remaining stable. Discussed with Dr. Imogene Burn. Pt is right hand dominant and therefore her speech center is likely in the left hemisphere. Her carotid lesion is in the right ICA, not the left. Her left facial droop and slurred speech are not from her right ICA lesion. Follow up with cardiologist as scheduled, and neurologist.    PLAN:  I discussed in depth with the patient the nature of atherosclerosis, and emphasized the importance of maximal medical management including strict control of blood pressure, blood glucose, and lipid levels, obtaining regular exercise, and continued cessation of smoking.  The patient is aware that without maximal medical management the underlying atherosclerotic disease process will progress, limiting the benefit of any interventions.  Based on the patient's vascular studies and examination, and after discussing with Dr. Imogene Burn, pt will return to clinic in 6 months for  carotid Duplex.  The patient was given information about stroke prevention including signs, symptoms, treatment, what symptoms should prompt the patient to seek immediate medical care, and risk reduction measures to take.  Charisse March, RN, MSN, FNP-C [facility] Phone: [phone]  Clinic MD: Sophia Burn  12/13/2013 11:25 AM

## 2013-12-13 NOTE — Patient Instructions (Signed)

## 2014-01-07 ENCOUNTER — Ambulatory Visit (HOSPITAL_COMMUNITY): Payer: Medicare Other | Attending: Cardiology | Admitting: Cardiology

## 2014-01-07 DIAGNOSIS — I359 Nonrheumatic aortic valve disorder, unspecified: Secondary | ICD-10-CM | POA: Insufficient documentation

## 2014-01-07 DIAGNOSIS — I379 Nonrheumatic pulmonary valve disorder, unspecified: Secondary | ICD-10-CM | POA: Insufficient documentation

## 2014-01-07 DIAGNOSIS — I517 Cardiomegaly: Secondary | ICD-10-CM | POA: Insufficient documentation

## 2014-01-07 DIAGNOSIS — I350 Nonrheumatic aortic (valve) stenosis: Secondary | ICD-10-CM

## 2014-01-07 NOTE — Progress Notes (Signed)
Echo performed. 

## 2014-01-13 ENCOUNTER — Telehealth: Payer: Self-pay | Admitting: Cardiovascular Disease

## 2014-01-13 NOTE — Telephone Encounter (Signed)
PT AWARE OF ECHO RESULTS./CY 

## 2014-01-13 NOTE — Telephone Encounter (Signed)
New message ° ° ° ° °Returning Christine's call °

## 2014-02-18 ENCOUNTER — Ambulatory Visit (INDEPENDENT_AMBULATORY_CARE_PROVIDER_SITE_OTHER): Payer: Medicare Other | Admitting: Cardiovascular Disease

## 2014-02-18 ENCOUNTER — Encounter: Payer: Self-pay | Admitting: Cardiovascular Disease

## 2014-02-18 VITALS — BP 140/80 | HR 68 | Ht 60.0 in | Wt 173.4 lb

## 2014-02-18 DIAGNOSIS — I359 Nonrheumatic aortic valve disorder, unspecified: Secondary | ICD-10-CM

## 2014-02-18 DIAGNOSIS — E785 Hyperlipidemia, unspecified: Secondary | ICD-10-CM

## 2014-02-18 DIAGNOSIS — I350 Nonrheumatic aortic (valve) stenosis: Secondary | ICD-10-CM

## 2014-02-18 DIAGNOSIS — I10 Essential (primary) hypertension: Secondary | ICD-10-CM

## 2014-02-18 DIAGNOSIS — I6529 Occlusion and stenosis of unspecified carotid artery: Secondary | ICD-10-CM

## 2014-02-18 DIAGNOSIS — I6521 Occlusion and stenosis of right carotid artery: Secondary | ICD-10-CM

## 2014-02-18 NOTE — Assessment & Plan Note (Signed)
Cholesterol is at goal.  Continue current dose of statin and diet Rx.  No myalgias or side effects.  F/U  LFT's in 6 months. No results found for this basename: LDLCALC  Labs with primary            

## 2014-02-18 NOTE — Patient Instructions (Signed)
Your physician wants you to follow-up in: YEAR WITH DR NISHAN  You will receive a reminder letter in the mail two months in advance. If you don't receive a letter, please call our office to schedule the follow-up appointment.  Your physician recommends that you continue on your current medications as directed. Please refer to the Current Medication list given to you today. 

## 2014-02-18 NOTE — Assessment & Plan Note (Signed)
Mild no change in murmur consider echo in a year or if new symptoms

## 2014-02-18 NOTE — Assessment & Plan Note (Signed)
Well controlled.  Continue current medications and low sodium Dash type diet.    

## 2014-02-18 NOTE — Assessment & Plan Note (Signed)
ASA/Plavix 60-79%  F/u duplex q 6months per Dr Imogene Burnhen

## 2014-02-18 NOTE — Progress Notes (Signed)
Patient ID: Sophia Suarez, female   DOB: Jul 23, 1940, 73 y.o.   MRN: [id number] yo seen by me 5/14 . Has had TIAls with borderline carotid lesion. Sees Dr Marjory LiesPenumalli as well. Reviewed CTA from 4/26 and RICA has a ? Of 55-60% stenosis not typically bad enough to operate on. Had a F/U MRI last Thursday Results not available. Over last 6 months has had multople episodes of left sided paresthesia Not clearly TIA;s and ? Migraine, vascular headaches or neuralgia No history of CAD. Active with no chest pain. Long standing murmur. Echo done 3/15  reviewed  Mild AS mean gradient 8 peak 16 mmHg  Study Conclusions  - Left ventricle: The cavity size was normal. There was moderate focal basal and mild concentric hypertrophy. Systolic function was normal. The estimated ejection fraction was in the range of 55% to 60%. Wall motion was normal; there were no regional wall motion abnormalities. There was an increased relative contribution of atrial contraction to ventricular filling. Doppler parameters are consistent with abnormal left ventricular relaxation (grade 1 diastolic dysfunction). - Aortic valve: Moderate thickening and calcification, consistent with sclerosis. Valve mobility was mildly restricted. There was mild stenosis. There was mild regurgitation. - Mitral valve: Mild calcification. There was mild regurgitation. - Left atrium: The atrium was mildly dilated. - Atrial septum: The interatrial septum is poorly visualized but appears thickened. In the epigastric view there may be a mass eminating off of the septum into the RA but this also could represent marked lipomatous hypertrophy of the interatrial septum. Recommend TEE if clinically indicated for further delineation. - Pulmonic valve: There was trivial regurgitation.   08/21/13 had normal myovue with no ischemia or infarction  6/15  60-79% RICA with peak systolic velocity 1.98 and diastolic under 1 m/sec     ROS: Denies fever, malais,  weight loss, blurry vision, decreased visual acuity, cough, sputum, SOB, hemoptysis, pleuritic pain, palpitaitons, heartburn, abdominal pain, melena, lower extremity edema, claudication, or rash.  All other systems reviewed and negative  General: Affect appropriate Overweight white female  HEENT: normal Neck supple with no adenopathy JVP normal right  bruits no thyromegaly Lungs clear with no wheezing and good diaphragmatic motion Heart:  S1/S2 midl AS  murmur, no rub, gallop or click PMI normal Abdomen: benighn, BS positve, no tenderness, no AAA no bruit.  No HSM or HJR Distal pulses intact with no bruits No edema Neuro non-focal Skin warm and dry No muscular weakness   Current Outpatient Prescriptions  Medication Sig Dispense Refill  . albuterol (PROAIR HFA) 108 (90 BASE) MCG/ACT inhaler Inhale 2 puffs into the lungs every 6 (six) hours as needed.      Marland Kitchen. aspirin 81 MG tablet Take 81 mg by mouth daily.      Marland Kitchen. BIOTIN PO Take 2,000 mg by mouth daily.      . brimonidine (ALPHAGAN) 0.15 % ophthalmic solution 1 drop 3 (three) times daily.      . clopidogrel (PLAVIX) 75 MG tablet Take 1 tablet (75 mg total) by mouth daily.  90 tablet  1  . Cyanocobalamin (VITAMIN B-12 PO) Take by mouth daily.      . diphenhydrAMINE (BENADRYL) 50 MG capsule Take 50 mg by mouth every 6 (six) hours as needed. Pt states she takes 2 tabs at night and 2 tabs in the morning and maybe one tab in the evening      . Docusate Calcium (STOOL SOFTENER PO) Take by mouth.      .Marland Kitchen  EPINEPHrine (EPI-PEN) 0.3 mg/0.3 mL DEVI Inject 0.3 mLs (0.3 mg total) into the muscle once.  1 Device  1  . fish oil-omega-3 fatty acids 1000 MG capsule Take 2 g by mouth 2 (two) times daily. Taking 3 capsules in the am and 2 @ pm      . Glucosamine-Chondroitin (GLUCOSAMINE CHONDR COMPLEX) 500-400 MG CAPS Take 1,000 mg by mouth 2 (two) times daily. Pt states on rainy days she takes 3 tablets and on regular days she takes 2 tablets      .  hydrochlorothiazide (MICROZIDE) 12.5 MG capsule Take 1 capsule (12.5 mg total) by mouth daily.  90 capsule  3  . losartan (COZAAR) 100 MG tablet Take 100 mg by mouth daily.       . meclizine (ANTIVERT) 25 MG tablet Take 25 mg by mouth as needed.      . meloxicam (MOBIC) 15 MG tablet Take 15 mg by mouth daily.      . Multiple Vitamin (MULTIVITAMIN) capsule Take 1 capsule by mouth daily.      Marland Kitchen omeprazole (PRILOSEC) 20 MG capsule Take 20 mg by mouth 2 (two) times daily.       Bertram Gala Glycol-Propyl Glycol (SYSTANE OP) Apply to eye as directed.      . simvastatin (ZOCOR) 40 MG tablet Take 40 mg by mouth every evening.      . Travoprost, BAK Free, (TRAVATAN) 0.004 % SOLN ophthalmic solution 1 drop as directed.       No current facility-administered medications for this visit.    Allergies  Clindamycin/lincomycin; Septra; Sulfa antibiotics; Melatonin; Horse-derived products; Mold extract; and Pollen extract  Electrocardiogram:  SR rate 69 normal   Assessment and Plan

## 2014-04-08 ENCOUNTER — Other Ambulatory Visit: Payer: Self-pay | Admitting: Diagnostic Neuroimaging

## 2014-05-05 ENCOUNTER — Other Ambulatory Visit: Payer: Self-pay | Admitting: Diagnostic Neuroimaging

## 2014-05-25 ENCOUNTER — Other Ambulatory Visit: Payer: Self-pay | Admitting: Diagnostic Neuroimaging

## 2014-06-12 ENCOUNTER — Encounter: Payer: Self-pay | Admitting: Family

## 2014-06-13 ENCOUNTER — Encounter: Payer: Self-pay | Admitting: Family

## 2014-06-13 ENCOUNTER — Ambulatory Visit (HOSPITAL_COMMUNITY)
Admission: RE | Admit: 2014-06-13 | Discharge: 2014-06-13 | Disposition: A | Discharge status: Home / Self Care | Payer: Medicare Other | Source: Ambulatory Visit | Attending: Vascular Surgery | Admitting: Vascular Surgery

## 2014-06-13 ENCOUNTER — Ambulatory Visit (INDEPENDENT_AMBULATORY_CARE_PROVIDER_SITE_OTHER): Payer: Medicare Other | Admitting: Family

## 2014-06-13 VITALS — BP 154/74 | HR 66 | Resp 14 | Ht 60.0 in | Wt 175.0 lb

## 2014-06-13 DIAGNOSIS — I6523 Occlusion and stenosis of bilateral carotid arteries: Secondary | ICD-10-CM

## 2014-06-13 NOTE — Addendum Note (Signed)
Addended by: Sharee PimpleMCCHESNEY, Byrant Valent K on: 06/13/2014 02:53 PM   Modules accepted: Orders

## 2014-06-13 NOTE — Patient Instructions (Signed)
Stroke Prevention Some medical conditions and behaviors are associated with an increased chance of having a stroke. You may prevent a stroke by making healthy choices and managing medical conditions. HOW CAN I REDUCE MY RISK OF HAVING A STROKE?   Stay physically active. Get at least 30 minutes of activity on most or all days.  Do not smoke. It may also be helpful to avoid exposure to secondhand smoke.  Limit alcohol use. Moderate alcohol use is considered to be:  No more than 2 drinks per day for men.  No more than 1 drink per day for nonpregnant women.  Eat healthy foods. This involves:  Eating 5 or more servings of fruits and vegetables a day.  Making dietary changes that address high blood pressure (hypertension), high cholesterol, diabetes, or obesity.  Manage your cholesterol levels.  Making food choices that are high in fiber and low in saturated fat, trans fat, and cholesterol may control cholesterol levels.  Take any prescribed medicines to control cholesterol as directed by your health care provider.  Manage your diabetes.  Controlling your carbohydrate and sugar intake is recommended to manage diabetes.  Take any prescribed medicines to control diabetes as directed by your health care provider.  Control your hypertension.  Making food choices that are low in salt (sodium), saturated fat, trans fat, and cholesterol is recommended to manage hypertension.  Take any prescribed medicines to control hypertension as directed by your health care provider.  Maintain a healthy weight.  Reducing calorie intake and making food choices that are low in sodium, saturated fat, trans fat, and cholesterol are recommended to manage weight.  Stop drug abuse.  Avoid taking birth control pills.  Talk to your health care provider about the risks of taking birth control pills if you are over 35 years old, smoke, get migraines, or have ever had a blood clot.  Get evaluated for sleep  disorders (sleep apnea).  Talk to your health care provider about getting a sleep evaluation if you snore a lot or have excessive sleepiness.  Take medicines only as directed by your health care provider.  For some people, aspirin or blood thinners (anticoagulants) are helpful in reducing the risk of forming abnormal blood clots that can lead to stroke. If you have the irregular heart rhythm of atrial fibrillation, you should be on a blood thinner unless there is a good reason you cannot take them.  Understand all your medicine instructions.  Make sure that other conditions (such as anemia or atherosclerosis) are addressed. SEEK IMMEDIATE MEDICAL CARE IF:   You have sudden weakness or numbness of the face, arm, or leg, especially on one side of the body.  Your face or eyelid droops to one side.  You have sudden confusion.  You have trouble speaking (aphasia) or understanding.  You have sudden trouble seeing in one or both eyes.  You have sudden trouble walking.  You have dizziness.  You have a loss of balance or coordination.  You have a sudden, severe headache with no known cause.  You have new chest pain or an irregular heartbeat. Any of these symptoms may represent a serious problem that is an emergency. Do not wait to see if the symptoms will go away. Get medical help at once. Call your local emergency services (911 in [place] Do not drive yourself to the hospital. Document Released: 08/04/2004 Document Revised: 11/11/2013 Document Reviewed: 12/28/2012 [employer] Patient Information 2015 [employer]. This information is not intended to replace advice given   to you by your health care provider. Make sure you discuss any questions you have with your health care provider.  

## 2014-06-13 NOTE — Progress Notes (Signed)
Established Carotid Patient   History of Present Illness  Sophia Suarez is a 73 y.o. female patient of Dr. Imogene Burnhen who has known carotid stenosis. She returns today for routine surveillance.  Patient has not had previous carotid artery intervention.  Patient has Positive history of stroke about 2008 which manifested as dizziness, left facial drooping, left eye vision blurred, and left upper and lower extremity tingling and numbness, no weakness; denies any further strokes or TIA's.   Patient denies residual effects from her stroke other than a drawing feeling in left side of face when she gets very tired.  Patient reports New Medical or Surgical History: is under treatment for sinus problems, saw her PCP and ENT. Was mostly bed-bound for a while due to severe vertigo and falling; this has since resolved.  Patient states has has a known heart murmur since her first pregnancy.  Also states she has severe OA in her back and left knee.  She is trying to improve her diet and exercise more with walking and arm resistance exercises.   Her neurologist, Dr. Oswaldo ConroyPenamali, told her that she had a TIA, but Dr. Imogene Burnhen saw no evidence of this on imaging of brain.  She is newly diagnosed with glaucoma and since this has been treated her dizziness and nausea have resolved.   Pt Diabetic: Yes, patient states in good control  Pt smoker: non-smoker  Pt meds include:  Statin : Yes  ASA: Yes  Other anticoagulants/antiplatelets: Plavix  Past Medical History  Diagnosis Date  . Hypertension   . Diabetes mellitus   . Hyperlipidemia   . Carotid artery occlusion   . GERD (gastroesophageal reflux disease)   . Asthma   . Diverticulitis   . Chronic pain   . Lumbago   . Heart murmur   . Panic attacks   . Stroke   . Allergic rhinitis, seasonal   . Anemia     Social History History  Substance Use Topics  . Smoking status: Never Smoker   . Smokeless tobacco: Never Used  . Alcohol Use: 0.0 - 0.5  oz/week    0-1 drink(s) per week     Comment: occasionally; 1 drink monthly    Family History Family History  Problem Relation Age of Onset  . Aneurysm Mother     aortic  . Coronary artery disease Mother     CABG  . Cancer Mother   . Diabetes Mother     Amputation  . Heart disease Mother     AAA  . Hypertension Mother   . Hyperlipidemia Mother   . Cancer Father     Brain  . Hypertension Father   . Deep vein thrombosis Father   . Heart disease Father   . Hyperlipidemia Father   . Hypertension Brother   . Hypertension Son     Surgical History Past Surgical History  Procedure Laterality Date  . Ovarian cyst surgery    . Partial hysterectomy    . Biopsy thyroid  Dec 05, 2013    Tumor- Benign  . Colectomy  2003    extent uncertain  . Appendectomy  1958  . Abdominal hysterectomy  1973    Partial    Allergies  Allergen Reactions  . Clindamycin/Lincomycin Anaphylaxis  . Septra [Sulfamethoxazole-Trimethoprim] Anaphylaxis  . Sulfa Antibiotics Anaphylaxis  . Melatonin Other (See Comments) and Hypertension    Over active   . Horse-Derived Products   . Mold Extract [Trichophyton]   . Pollen Extract  Current Outpatient Prescriptions  Medication Sig Dispense Refill  . albuterol (PROAIR HFA) 108 (90 BASE) MCG/ACT inhaler Inhale 2 puffs into the lungs every 6 (six) hours as needed.    Marland Kitchen. aspirin 81 MG tablet Take 81 mg by mouth daily.    . B Complex-C (B-COMPLEX WITH VITAMIN C) tablet Take 1 tablet by mouth daily.    Marland Kitchen. BIOTIN PO Take 2,000 mg by mouth daily.    . brimonidine (ALPHAGAN) 0.15 % ophthalmic solution 1 drop 3 (three) times daily.    . clopidogrel (PLAVIX) 75 MG tablet TAKE 1 TABLET (75 MG TOTAL) BY MOUTH DAILY. 30 tablet 0  . Cyanocobalamin (VITAMIN B-12 PO) Take by mouth daily.    . diphenhydrAMINE (BENADRYL) 50 MG capsule Take 50 mg by mouth every 6 (six) hours as needed. Pt states she takes 2 tabs at night and 2 tabs in the morning and maybe one tab in  the evening    . Docusate Calcium (STOOL SOFTENER PO) Take by mouth.    . EPINEPHrine (EPI-PEN) 0.3 mg/0.3 mL DEVI Inject 0.3 mLs (0.3 mg total) into the muscle once. 1 Device 1  . fish oil-omega-3 fatty acids 1000 MG capsule Take 2 g by mouth 2 (two) times daily. Taking 3 capsules in the am and 2 @ pm    . Glucosamine-Chondroitin (GLUCOSAMINE CHONDR COMPLEX) 500-400 MG CAPS Take 1,000 mg by mouth 2 (two) times daily. Pt states on rainy days she takes 3 tablets and on regular days she takes 2 tablets    . hydrochlorothiazide (MICROZIDE) 12.5 MG capsule Take 1 capsule (12.5 mg total) by mouth daily. 90 capsule 3  . losartan (COZAAR) 100 MG tablet Take 100 mg by mouth daily.     . meclizine (ANTIVERT) 25 MG tablet Take 25 mg by mouth as needed.    . meloxicam (MOBIC) 15 MG tablet Take 15 mg by mouth daily.    . Multiple Vitamin (MULTIVITAMIN) capsule Take 1 capsule by mouth daily.    Marland Kitchen. omeprazole (PRILOSEC) 20 MG capsule Take 20 mg by mouth 2 (two) times daily.     Bertram Gala. Polyethyl Glycol-Propyl Glycol (SYSTANE OP) Apply to eye as directed.    . simvastatin (ZOCOR) 40 MG tablet Take 40 mg by mouth every evening.    . Travoprost, BAK Free, (TRAVATAN) 0.004 % SOLN ophthalmic solution 1 drop as directed.     No current facility-administered medications for this visit.    Review of Systems : See HPI for pertinent positives and negatives.  Physical Examination  Filed Vitals:   06/13/14 1138 06/13/14 1139  BP: 155/69 154/74  Pulse: 59 66  Resp:  14  Height:  5' (1.524 m)  Weight:  175 lb (79.379 kg)  SpO2:  100%   Body mass index is 34.18 kg/(m^2).  General: WDWN obese female in NAD  GAIT: normal  Eyes: PERRLA  Pulmonary: CTAB, Negative Rales, Negative rhonchi, & Negative wheezing.  Cardiac: regular Rhythm , Positive mild Murmur.   VASCULAR EXAM  Carotid Bruits  Left  Right    Positive negative   Aorta is not palpable Radial pulses are 2+ palpable and equal.   LE Pulses   LEFT  RIGHT   POPLITEAL  Not palpable  Not palpable   POSTERIOR TIBIAL  not palpable  not palpable   DORSALIS PEDIS  ANTERIOR TIBIAL  Faintly palpable  Faintly palpable    Gastrointestinal: soft, nontender, BS WNL, no r/g, no palpable masses.  Musculoskeletal: Negative muscle  atrophy/wasting. M/S 5/5 throughout, Extremities without ischemic changes.  Neurologic: A&O X 3; Appropriate Affect ; SENSATION ;normal;  Speech is normal  CN 2-12 intact, Pain and light touch intact in extremities, Motor exam as listed above.   Non-Invasive Vascular Imaging CAROTID DUPLEX 06/13/2014   Right ICA: 40 - 59 % stenosis. Left ICA: <50% stenosis.  These findings are Unchanged from previous exam.   Assessment: DANIRA NYLANDER is a 73 y.o. female who presents with asymptomatic 40 - 59 % right ICA stenosis and <50% left ICA stenosis. The  ICA stenosis is  Unchanged from previous exam.  Plan: Follow-up in 1 year with Carotid Duplex.   I discussed in depth with the patient the nature of atherosclerosis, and emphasized the importance of maximal medical management including strict control of blood pressure, blood glucose, and lipid levels, obtaining regular exercise, and continued cessation of smoking.  The patient is aware that without maximal medical management the underlying atherosclerotic disease process will progress, limiting the benefit of any interventions. The patient was given information about stroke prevention and what symptoms should prompt the patient to seek immediate medical care. Thank you for allowing [place] to participate in this patient's care.  Charisse March, RN, MSN, FNP-C [facility] Office: [phone]  Clinic Physician: Imogene Burn  06/13/2014 12:01 PM

## 2014-09-14 ENCOUNTER — Other Ambulatory Visit: Payer: Self-pay | Admitting: Cardiovascular Disease

## 2014-10-10 HISTORY — PX: EYE SURGERY: SHX253

## 2014-10-17 HISTORY — PX: EYE SURGERY: SHX253

## 2015-03-13 ENCOUNTER — Other Ambulatory Visit: Payer: Self-pay | Admitting: Cardiovascular Disease

## 2015-06-13 ENCOUNTER — Other Ambulatory Visit: Payer: Self-pay | Admitting: Cardiovascular Disease

## 2015-06-17 ENCOUNTER — Ambulatory Visit: Payer: Self-pay | Admitting: Family

## 2015-06-17 ENCOUNTER — Encounter (HOSPITAL_COMMUNITY): Payer: Self-pay

## 2015-07-31 ENCOUNTER — Encounter: Payer: Self-pay | Admitting: Family

## 2015-08-07 ENCOUNTER — Encounter: Payer: Self-pay | Admitting: Family

## 2015-08-07 ENCOUNTER — Ambulatory Visit (INDEPENDENT_AMBULATORY_CARE_PROVIDER_SITE_OTHER): Payer: Medicare Other | Admitting: Family

## 2015-08-07 ENCOUNTER — Ambulatory Visit (HOSPITAL_COMMUNITY)
Admission: RE | Admit: 2015-08-07 | Discharge: 2015-08-07 | Disposition: A | Discharge status: Home / Self Care | Payer: Medicare Other | Source: Ambulatory Visit | Attending: Family | Admitting: Family

## 2015-08-07 VITALS — BP 170/74 | HR 73 | Temp 97.6°F | Resp 16 | Ht 60.0 in | Wt 179.0 lb

## 2015-08-07 DIAGNOSIS — I10 Essential (primary) hypertension: Secondary | ICD-10-CM | POA: Diagnosis not present

## 2015-08-07 DIAGNOSIS — IMO0001 Reserved for inherently not codable concepts without codable children: Secondary | ICD-10-CM

## 2015-08-07 DIAGNOSIS — E119 Type 2 diabetes mellitus without complications: Secondary | ICD-10-CM | POA: Insufficient documentation

## 2015-08-07 DIAGNOSIS — E785 Hyperlipidemia, unspecified: Secondary | ICD-10-CM | POA: Insufficient documentation

## 2015-08-07 DIAGNOSIS — I6523 Occlusion and stenosis of bilateral carotid arteries: Secondary | ICD-10-CM | POA: Diagnosis not present

## 2015-08-07 DIAGNOSIS — R030 Elevated blood-pressure reading, without diagnosis of hypertension: Secondary | ICD-10-CM | POA: Diagnosis not present

## 2015-08-07 NOTE — Patient Instructions (Signed)
Stroke Prevention Some medical conditions and behaviors are associated with an increased chance of having a stroke. You may prevent a stroke by making healthy choices and managing medical conditions. HOW CAN I REDUCE MY RISK OF HAVING A STROKE?   Stay physically active. Get at least 30 minutes of activity on most or all days.  Do not smoke. It may also be helpful to avoid exposure to secondhand smoke.  Limit alcohol use. Moderate alcohol use is considered to be:  No more than 2 drinks per day for men.  No more than 1 drink per day for nonpregnant women.  Eat healthy foods. This involves:  Eating 5 or more servings of fruits and vegetables a day.  Making dietary changes that address high blood pressure (hypertension), high cholesterol, diabetes, or obesity.  Manage your cholesterol levels.  Making food choices that are high in fiber and low in saturated fat, trans fat, and cholesterol may control cholesterol levels.  Take any prescribed medicines to control cholesterol as directed by your health care provider.  Manage your diabetes.  Controlling your carbohydrate and sugar intake is recommended to manage diabetes.  Take any prescribed medicines to control diabetes as directed by your health care provider.  Control your hypertension.  Making food choices that are low in salt (sodium), saturated fat, trans fat, and cholesterol is recommended to manage hypertension.  Ask your health care provider if you need treatment to lower your blood pressure. Take any prescribed medicines to control hypertension as directed by your health care provider.  If you are 18-39 years of age, have your blood pressure checked every 3-5 years. If you are 40 years of age or older, have your blood pressure checked every year.  Maintain a healthy weight.  Reducing calorie intake and making food choices that are low in sodium, saturated fat, trans fat, and cholesterol are recommended to manage  weight.  Stop drug abuse.  Avoid taking birth control pills.  Talk to your health care provider about the risks of taking birth control pills if you are over 35 years old, smoke, get migraines, or have ever had a blood clot.  Get evaluated for sleep disorders (sleep apnea).  Talk to your health care provider about getting a sleep evaluation if you snore a lot or have excessive sleepiness.  Take medicines only as directed by your health care provider.  For some people, aspirin or blood thinners (anticoagulants) are helpful in reducing the risk of forming abnormal blood clots that can lead to stroke. If you have the irregular heart rhythm of atrial fibrillation, you should be on a blood thinner unless there is a good reason you cannot take them.  Understand all your medicine instructions.  Make sure that other conditions (such as anemia or atherosclerosis) are addressed. SEEK IMMEDIATE MEDICAL CARE IF:   You have sudden weakness or numbness of the face, arm, or leg, especially on one side of the body.  Your face or eyelid droops to one side.  You have sudden confusion.  You have trouble speaking (aphasia) or understanding.  You have sudden trouble seeing in one or both eyes.  You have sudden trouble walking.  You have dizziness.  You have a loss of balance or coordination.  You have a sudden, severe headache with no known cause.  You have new chest pain or an irregular heartbeat. Any of these symptoms may represent a serious problem that is an emergency. Do not wait to see if the symptoms will   go away. Get medical help at once. Call your local emergency services (911 in [place] Do not drive yourself to the hospital.   This information is not intended to replace advice given to you by your health care provider. Make sure you discuss any questions you have with your health care provider.   Document Released: 08/04/2004 Document Revised: 07/18/2014 Document Reviewed:  12/28/2012 Elsevier Interactive Patient Education 2016 Else[employer]  

## 2015-08-07 NOTE — Progress Notes (Signed)
Filed Vitals:   08/07/15 1340 08/07/15 1346 08/07/15 1355 08/07/15 1357  BP: 161/71 174/68 152/69 170/74  Pulse: 72 72 73 73  Temp:  97.6 F (36.4 C)    TempSrc:  Oral    Resp:  16    Height:  5' (1.524 m)    Weight:  179 lb (81.194 kg)    SpO2:  96%

## 2015-08-07 NOTE — Progress Notes (Signed)
Chief Complaint: Extracranial Carotid Artery Stenosis   History of Present Illness  Sophia Suarez is a 75 y.o. female patient of Dr. Imogene Burn who has known extracranial carotid artery stenosis. She returns today for routine surveillance.  Patient has not had previous carotid artery intervention.  She had a stroke about 2008 which manifested as dizziness, left facial drooping, left eye vision blurred, and left upper and lower extremity tingling and numbness, no weakness; denies any further strokes or TIA's.   Patient denies residual effects from her stroke other than a drawing feeling in left side of face when she gets very tired.  Patient reports New Medical or Surgical History: is under treatment for sinus problems, saw her PCP and ENT. Was mostly bed-bound for a while due to severe vertigo and falling; this has since resolved.  Patient states has has a known heart murmur since her first pregnancy.  Also states she has severe OA in her back and left knee.   Her neurologist, Dr. Oswaldo Conroy, told her that she had a TIA.  She was diagnosed with glaucoma and since this has been treated her dizziness and nausea have resolved.   Her urinary incontinence is an issue for her.   Pt Diabetic: Yes, patient states in good control  Pt smoker: non-smoker   Pt meds include:  Statin : Yes  ASA: Yes  Other anticoagulants/antiplatelets: Plavix    Past Medical History  Diagnosis Date  . Hypertension   . Diabetes mellitus   . Hyperlipidemia   . Carotid artery occlusion   . GERD (gastroesophageal reflux disease)   . Asthma   . Diverticulitis   . Chronic pain   . Lumbago   . Heart murmur   . Panic attacks   . Stroke   . Allergic rhinitis, seasonal   . Anemia     Social History Social History  Substance Use Topics  . Smoking status: Never Smoker   . Smokeless tobacco: Never Used  . Alcohol Use: 0.0 - 0.5 oz/week    0-1 drink(s) per week     Comment: occasionally; 1 drink  monthly    Family History Family History  Problem Relation Age of Onset  . Aneurysm Mother     aortic  . Coronary artery disease Mother     CABG  . Cancer Mother   . Diabetes Mother     Amputation  . Heart disease Mother     AAA  . Hypertension Mother   . Hyperlipidemia Mother   . Cancer Father     Brain  . Hypertension Father   . Deep vein thrombosis Father   . Heart disease Father   . Hyperlipidemia Father   . Hypertension Brother   . Hypertension Son     Surgical History Past Surgical History  Procedure Laterality Date  . Ovarian cyst surgery    . Partial hysterectomy    . Biopsy thyroid  Dec 05, 2013    Tumor- Benign  . Colectomy  2003    extent uncertain  . Appendectomy  1958  . Abdominal hysterectomy  1973    Partial    Allergies  Allergen Reactions  . Clindamycin/Lincomycin Anaphylaxis  . Septra [Sulfamethoxazole-Trimethoprim] Anaphylaxis  . Sulfa Antibiotics Anaphylaxis  . Melatonin Other (See Comments) and Hypertension    Over active   . Horse-Derived Products   . Mold Extract [Trichophyton]   . Pollen Extract     Current Outpatient Prescriptions  Medication Sig Dispense Refill  .  albuterol (PROAIR HFA) 108 (90 BASE) MCG/ACT inhaler Inhale 2 puffs into the lungs every 6 (six) hours as needed.    Marland Kitchen aspirin 81 MG tablet Take 81 mg by mouth daily.    . B Complex-C (B-COMPLEX WITH VITAMIN C) tablet Take 1 tablet by mouth daily.    Marland Kitchen BIOTIN PO Take 2,000 mg by mouth daily.    . brimonidine (ALPHAGAN) 0.15 % ophthalmic solution 1 drop 3 (three) times daily.    . clopidogrel (PLAVIX) 75 MG tablet TAKE 1 TABLET (75 MG TOTAL) BY MOUTH DAILY. 30 tablet 0  . Cyanocobalamin (VITAMIN B-12 PO) Take by mouth daily.    . diphenhydrAMINE (BENADRYL) 50 MG capsule Take 50 mg by mouth every 6 (six) hours as needed. Pt states she takes 2 tabs at night and 2 tabs in the morning and maybe one tab in the evening    . Docusate Calcium (STOOL SOFTENER PO) Take by mouth.     . EPINEPHrine (EPI-PEN) 0.3 mg/0.3 mL DEVI Inject 0.3 mLs (0.3 mg total) into the muscle once. 1 Device 1  . fish oil-omega-3 fatty acids 1000 MG capsule Take 2 g by mouth 2 (two) times daily. Taking 3 capsules in the am and 2 @ pm    . Glucosamine-Chondroitin (GLUCOSAMINE CHONDR COMPLEX) 500-400 MG CAPS Take 1,000 mg by mouth 2 (two) times daily. Pt states on rainy days she takes 3 tablets and on regular days she takes 2 tablets    . hydrochlorothiazide (MICROZIDE) 12.5 MG capsule Take 1 capsule (12.5 mg total) by mouth daily. 30 capsule 0  . losartan (COZAAR) 100 MG tablet Take 100 mg by mouth daily.     . meclizine (ANTIVERT) 25 MG tablet Take 25 mg by mouth as needed.    . meloxicam (MOBIC) 15 MG tablet Take 15 mg by mouth daily.    . Multiple Vitamin (MULTIVITAMIN) capsule Take 1 capsule by mouth daily.    Marland Kitchen omeprazole (PRILOSEC) 20 MG capsule Take 20 mg by mouth 2 (two) times daily.     Bertram Gala Glycol-Propyl Glycol (SYSTANE OP) Apply to eye as directed.    . simvastatin (ZOCOR) 40 MG tablet Take 40 mg by mouth every evening.    . Travoprost, BAK Free, (TRAVATAN) 0.004 % SOLN ophthalmic solution 1 drop as directed.     No current facility-administered medications for this visit.    Review of Systems : See HPI for pertinent positives and negatives.  Physical Examination  Filed Vitals:   08/07/15 1340 08/07/15 1346 08/07/15 1355 08/07/15 1357  BP: 161/71 174/68 152/69 170/74  Pulse: 72 72 73 73  Temp:  97.6 F (36.4 C)    TempSrc:  Oral    Resp:  16    Height:  5' (1.524 m)    Weight:  179 lb (81.194 kg)    SpO2:  96%     Body mass index is 34.96 kg/(m^2).   General: WDWN obese female in NAD  GAIT: normal  Eyes: PERRLA  Pulmonary: CTAB, respirations are non labored.  Cardiac: regular rhythm, positive low grade murmur.   VASCULAR EXAM  Carotid Bruits  Left  Right    Positive negative   Aorta is not palpable Radial pulses are 2+ palpable and  equal.   LE Pulses  LEFT  RIGHT   POPLITEAL  Not palpable  Not palpable   POSTERIOR TIBIAL  not palpable  not palpable   DORSALIS PEDIS  ANTERIOR TIBIAL  Faintly palpable  Faintly palpable    Gastrointestinal: soft, nontender, BS WNL, no r/g, no palpable masses.  Musculoskeletal: Negative muscle atrophy/wasting. M/S 5/5 throughout, Extremities without ischemic changes.  Neurologic: A&O X 3; Appropriate Affect ; SENSATION ;normal;  Speech is normal  CN 2-12 intact, Pain and light touch intact in extremities, Motor exam as listed above.         Non-Invasive Vascular Imaging CAROTID DUPLEX 08/07/2015   Right ICA: 40 - 59 % stenosis. Left ICA: <40% stenosis. No significant change compared to prior exam.    Assessment: Sophia Suarez is a 75 y.o. female who had a mild stroke in 2008, none subsequently. Today's carotid duplex suggests 40-59% right ICA stenosis and minimal left ICA stenosis.  No significant change compared to prior exam.    Her cardiologist manages her hypertension; I advised pt to see her cardiologist ASAP re her elevated blood pressure.   Plan: Follow-up in 1 year with Carotid Duplex scan.   I discussed in depth with the patient the nature of atherosclerosis, and emphasized the importance of maximal medical management including strict control of blood pressure, blood glucose, and lipid levels, obtaining regular exercise, and continued cessation of smoking.  The patient is aware that without maximal medical management the underlying atherosclerotic disease process will progress, limiting the benefit of any interventions. The patient was given information about stroke prevention and what symptoms should prompt the patient to seek immediate medical care. Thank you for allowing [place] to participate in this patient's care.  Charisse March, RN, MSN, FNP-C [facility] Office: [phone]  Clinic  Physician: Imogene Burn  08/07/2015 1:09 PM

## 2015-08-10 ENCOUNTER — Telehealth: Payer: Self-pay

## 2015-08-10 NOTE — Telephone Encounter (Signed)
Scheduled patient for a follow-up with Dr. Eden Emms. Patient coming in on 08/14/2015. Patient verbalized understanding.

## 2015-08-12 NOTE — Progress Notes (Signed)
Patient ID: Sophia Suarez, female   DOB: 05/29/1941, 75 y.o.   MRN: [id number] 75 y.o. seen by me 5/14 . Has had TIAls with borderline carotid lesion. Sees Dr Marjory Lies as well. Reviewed CTA from 4/26 and RICA has a ? Of 55-60% stenosis not typically bad enough to operate on.   No history of CAD. Active with no chest pain. Long standing murmur. Echo done 3/15  reviewed  Mild AS mean gradient 8 peak 16 mmHg  Study Conclusions  - Left ventricle: The cavity size was normal. There was moderate focal basal and mild concentric hypertrophy. Systolic function was normal. The estimated ejection fraction was in the range of 55% to 60%. Wall motion was normal; there were no regional wall motion abnormalities. There was an increased relative contribution of atrial contraction to ventricular filling. Doppler parameters are consistent with abnormal left ventricular relaxation (grade 1 diastolic dysfunction). - Aortic valve: Moderate thickening and calcification, consistent with sclerosis. Valve mobility was mildly restricted. There was mild stenosis. There was mild regurgitation. - Mitral valve: Mild calcification. There was mild regurgitation. - Left atrium: The atrium was mildly dilated. - Atrial septum: The interatrial septum is poorly visualized but appears thickened. In the epigastric view there may be a mass eminating off of the septum into the RA but this also could represent marked lipomatous hypertrophy of the interatrial septum. Recommend TEE if clinically indicated for further delineation. - Pulmonic valve: There was trivial regurgitation.   08/21/13 had normal myovue with no ischemia or infarction  08/07/15   40-59% RICA stenosis done at [facility]    Has not taken diuretic in 2 weeks BP up today    ROS: Denies fever, malais, weight loss, blurry vision, decreased visual acuity, cough, sputum, SOB, hemoptysis, pleuritic pain, palpitaitons, heartburn, abdominal pain, melena, lower extremity  edema, claudication, or rash.  All other systems reviewed and negative  General: Affect appropriate Overweight white female  HEENT: normal Neck supple with no adenopathy JVP normal right  bruits no thyromegaly Lungs clear with no wheezing and good diaphragmatic motion Heart:  S1/S2 midl AS  murmur, no rub, gallop or click PMI normal Abdomen: benighn, BS positve, no tenderness, no AAA no bruit.  No HSM or HJR Distal pulses intact with no bruits No edema Neuro non-focal Skin warm and dry No muscular weakness   Current Outpatient Prescriptions  Medication Sig Dispense Refill  . albuterol (PROAIR HFA) 108 (90 BASE) MCG/ACT inhaler Inhale 2 puffs into the lungs every 6 (six) hours as needed.    Marland Kitchen aspirin 81 MG tablet Take 81 mg by mouth daily.    . B Complex-C (B-COMPLEX WITH VITAMIN C) tablet Take 1 tablet by mouth daily. Reported on 08/07/2015    . BIOTIN PO Take 2,000 mg by mouth daily.    . clopidogrel (PLAVIX) 75 MG tablet TAKE 1 TABLET (75 MG TOTAL) BY MOUTH DAILY. 30 tablet 0  . co-enzyme Q-10 30 MG capsule Take 30 mg by mouth daily.    . diphenhydrAMINE (BENADRYL) 25 mg capsule Take 25 mg by mouth every 6 (six) hours as needed for itching or allergies.    Tery Sanfilippo Sodium (COLACE PO) Take 1 tablet by mouth daily as needed (for constipation).    Marland Kitchen EPINEPHrine (EPI-PEN) 0.3 mg/0.3 mL DEVI Inject 0.3 mLs (0.3 mg total) into the muscle once. 1 Device 1  . Glucosamine-Chondroitin (GLUCOSAMINE CHONDR COMPLEX) 500-400 MG CAPS Take 1,000 mg by mouth 2 (two) times daily. Reported on 08/07/2015    .  hydrochlorothiazide (MICROZIDE) 12.5 MG capsule Take 1 capsule (12.5 mg total) by mouth daily. 30 capsule 0  . latanoprost (XALATAN) 0.005 % ophthalmic solution Place 1 drop into both eyes at bedtime.    Marland Kitchen losartan (COZAAR) 100 MG tablet Take 100 mg by mouth daily.     . meloxicam (MOBIC) 15 MG tablet Take 15 mg by mouth daily.    . Multiple Vitamin (MULTIVITAMIN) capsule Take 1 capsule by  mouth daily.    Marland Kitchen olopatadine (PATANOL) 0.1 % ophthalmic solution Place 1 drop into both eyes 2 (two) times daily.    . Omega-3 Fatty Acids (FISH OIL) 1200 MG CAPS Take 2 capsules by mouth daily.    Marland Kitchen omeprazole (PRILOSEC) 20 MG capsule Take 20 mg by mouth 2 (two) times daily.     Bertram Gala Glycol-Propyl Glycol (SYSTANE) 0.4-0.3 % SOLN Place 1 drop into both eyes daily as needed (for tired eyes).    . simvastatin (ZOCOR) 40 MG tablet Take 40 mg by mouth every evening.    . vitamin B-12 (CYANOCOBALAMIN) 1000 MCG tablet Take 1,000 mcg by mouth daily.     No current facility-administered medications for this visit.    Allergies  Clindamycin/lincomycin; Septra; Sulfa antibiotics; Melatonin; Horse-derived products; Mold extract; and Pollen extract  Electrocardiogram:  2015  SR rate 69 normal  08/14/15  SR rate 77 normal ECG   Assessment and Plan AS: mild needs f/u echo S2 preserved on exam doubt severe AS  Carotid   40-59% RICA stenosis.  F/U duplex in 1 year. HTN: resume diuretic continue ARB  Discussed diet and weight loss    Charlton Haws

## 2015-08-14 ENCOUNTER — Ambulatory Visit (INDEPENDENT_AMBULATORY_CARE_PROVIDER_SITE_OTHER): Payer: Medicare Other | Admitting: Cardiovascular Disease

## 2015-08-14 ENCOUNTER — Encounter: Payer: Self-pay | Admitting: Cardiovascular Disease

## 2015-08-14 VITALS — BP 160/80 | HR 74 | Ht 60.0 in | Wt 180.0 lb

## 2015-08-14 DIAGNOSIS — I350 Nonrheumatic aortic (valve) stenosis: Secondary | ICD-10-CM

## 2015-08-14 DIAGNOSIS — I10 Essential (primary) hypertension: Secondary | ICD-10-CM

## 2015-08-14 MED ORDER — HYDROCHLOROTHIAZIDE 12.5 MG PO CAPS: 12.5000 mg | ORAL_CAPSULE | Freq: Every day | ORAL | Status: DC

## 2015-08-14 NOTE — Patient Instructions (Addendum)

## 2015-08-27 ENCOUNTER — Ambulatory Visit (HOSPITAL_COMMUNITY): Payer: Medicare Other | Attending: Internal Medicine

## 2015-08-27 ENCOUNTER — Other Ambulatory Visit: Payer: Self-pay

## 2015-08-27 DIAGNOSIS — I350 Nonrheumatic aortic (valve) stenosis: Secondary | ICD-10-CM

## 2015-08-27 DIAGNOSIS — I10 Essential (primary) hypertension: Secondary | ICD-10-CM

## 2015-08-27 DIAGNOSIS — I340 Nonrheumatic mitral (valve) insufficiency: Secondary | ICD-10-CM | POA: Diagnosis not present

## 2015-08-27 DIAGNOSIS — I119 Hypertensive heart disease without heart failure: Secondary | ICD-10-CM | POA: Insufficient documentation

## 2015-08-27 DIAGNOSIS — I352 Nonrheumatic aortic (valve) stenosis with insufficiency: Secondary | ICD-10-CM | POA: Insufficient documentation

## 2015-08-28 ENCOUNTER — Telehealth: Payer: Self-pay

## 2015-08-28 DIAGNOSIS — I350 Nonrheumatic aortic (valve) stenosis: Secondary | ICD-10-CM

## 2015-08-28 NOTE — Telephone Encounter (Signed)
Informed patient of results and verbal understanding expressed.  Repeat ECHO ordered to be scheduled in 1 year. Patient agrees with treatment plan. 

## 2015-08-28 NOTE — Telephone Encounter (Signed)
-----   Message from Wendall Stade, MD sent at 08/27/2015  4:44 PM EST ----- Mild AS stable normal EF f/u echo in a year

## 2015-09-25 ENCOUNTER — Other Ambulatory Visit: Payer: Self-pay

## 2015-09-25 DIAGNOSIS — Z1231 Encounter for screening mammogram for malignant neoplasm of breast: Secondary | ICD-10-CM

## 2015-10-07 ENCOUNTER — Ambulatory Visit
Admission: RE | Admit: 2015-10-07 | Discharge: 2015-10-07 | Disposition: A | Discharge status: Home / Self Care | Payer: Medicare Other | Source: Ambulatory Visit

## 2015-10-07 DIAGNOSIS — Z1231 Encounter for screening mammogram for malignant neoplasm of breast: Secondary | ICD-10-CM

## 2015-10-28 ENCOUNTER — Other Ambulatory Visit: Payer: Self-pay | Admitting: Family Medicine

## 2015-10-28 DIAGNOSIS — N179 Acute kidney failure, unspecified: Secondary | ICD-10-CM

## 2015-10-30 ENCOUNTER — Other Ambulatory Visit: Payer: Self-pay | Admitting: Orthopedic Surgery

## 2015-10-30 DIAGNOSIS — M25562 Pain in left knee: Secondary | ICD-10-CM

## 2015-11-03 ENCOUNTER — Ambulatory Visit
Admission: RE | Admit: 2015-11-03 | Discharge: 2015-11-03 | Disposition: A | Discharge status: Home / Self Care | Payer: Medicare Other | Source: Ambulatory Visit | Attending: Family Medicine | Admitting: Family Medicine

## 2015-11-03 DIAGNOSIS — N179 Acute kidney failure, unspecified: Secondary | ICD-10-CM

## 2015-11-06 ENCOUNTER — Ambulatory Visit
Admission: RE | Admit: 2015-11-06 | Discharge: 2015-11-06 | Disposition: A | Discharge status: Home / Self Care | Payer: Medicare Other | Source: Ambulatory Visit | Attending: Orthopedic Surgery | Admitting: Orthopedic Surgery

## 2015-11-06 DIAGNOSIS — M25562 Pain in left knee: Secondary | ICD-10-CM

## 2015-11-13 ENCOUNTER — Ambulatory Visit
Admission: RE | Admit: 2015-11-13 | Discharge: 2015-11-13 | Disposition: A | Discharge status: Home / Self Care | Payer: Medicare Other | Source: Ambulatory Visit | Attending: Orthopedic Surgery | Admitting: Orthopedic Surgery

## 2015-12-09 ENCOUNTER — Other Ambulatory Visit: Payer: Self-pay | Admitting: Obstetrics & Gynecology

## 2016-01-21 ENCOUNTER — Other Ambulatory Visit: Payer: Self-pay | Admitting: Gastroenterology

## 2016-01-21 DIAGNOSIS — R112 Nausea with vomiting, unspecified: Secondary | ICD-10-CM

## 2016-01-21 DIAGNOSIS — R1310 Dysphagia, unspecified: Secondary | ICD-10-CM

## 2016-01-22 ENCOUNTER — Ambulatory Visit
Admission: RE | Admit: 2016-01-22 | Discharge: 2016-01-22 | Disposition: A | Discharge status: Home / Self Care | Payer: Medicare Other | Source: Ambulatory Visit | Attending: Gastroenterology | Admitting: Gastroenterology

## 2016-01-22 DIAGNOSIS — R112 Nausea with vomiting, unspecified: Secondary | ICD-10-CM

## 2016-01-22 DIAGNOSIS — R1310 Dysphagia, unspecified: Secondary | ICD-10-CM

## 2016-08-03 ENCOUNTER — Other Ambulatory Visit: Payer: Self-pay | Admitting: Cardiovascular Disease

## 2016-08-08 ENCOUNTER — Encounter: Payer: Self-pay | Admitting: Family

## 2016-08-12 ENCOUNTER — Ambulatory Visit (INDEPENDENT_AMBULATORY_CARE_PROVIDER_SITE_OTHER): Payer: Medicare Other | Admitting: Family

## 2016-08-12 ENCOUNTER — Ambulatory Visit (HOSPITAL_COMMUNITY)
Admission: RE | Admit: 2016-08-12 | Discharge: 2016-08-12 | Disposition: A | Discharge status: Home / Self Care | Payer: Medicare Other | Source: Ambulatory Visit | Attending: Family | Admitting: Family

## 2016-08-12 ENCOUNTER — Encounter (HOSPITAL_COMMUNITY): Payer: Self-pay | Admitting: Radiology

## 2016-08-12 ENCOUNTER — Telehealth (HOSPITAL_COMMUNITY): Payer: Self-pay | Admitting: Radiology

## 2016-08-12 ENCOUNTER — Encounter: Payer: Self-pay | Admitting: Family

## 2016-08-12 VITALS — BP 131/82 | HR 71 | Temp 97.0°F | Resp 18 | Ht 60.0 in | Wt 157.0 lb

## 2016-08-12 DIAGNOSIS — I6523 Occlusion and stenosis of bilateral carotid arteries: Secondary | ICD-10-CM | POA: Insufficient documentation

## 2016-08-12 LAB — VAS US CAROTID
LCCAPSYS: 115 cm/s
LEFT ECA DIAS: -9 cm/s
LEFT VERTEBRAL DIAS: -17 cm/s
Left CCA dist dias: 25 cm/s
Left CCA dist sys: 103 cm/s
Left CCA prox dias: 26 cm/s
Left ICA prox dias: -23 cm/s
Left ICA prox sys: -104 cm/s
RIGHT CCA MID DIAS: -18 cm/s
RIGHT ECA DIAS: -6 cm/s
RIGHT VERTEBRAL DIAS: -11 cm/s
Right CCA prox dias: 17 cm/s
Right CCA prox sys: 87 cm/s

## 2016-08-12 NOTE — Addendum Note (Signed)
Addended by: Burton ApleyPETTY, Ugochi Henzler A on: 08/12/2016 02:23 PM   Modules accepted: Orders

## 2016-08-12 NOTE — Patient Instructions (Addendum)
Stroke Prevention Some medical conditions and behaviors are associated with an increased chance of having a stroke. You may prevent a stroke by making healthy choices and managing medical conditions. How can I reduce my risk of having a stroke?  Stay physically active. Get at least 30 minutes of activity on most or all days.  Do not smoke. It may also be helpful to avoid exposure to secondhand smoke.  Limit alcohol use. Moderate alcohol use is considered to be:  No more than 2 drinks per day for men.  No more than 1 drink per day for nonpregnant women.  Eat healthy foods. This involves:  Eating 5 or more servings of fruits and vegetables a day.  Making dietary changes that address high blood pressure (hypertension), high cholesterol, diabetes, or obesity.  Manage your cholesterol levels.  Making food choices that are high in fiber and low in saturated fat, trans fat, and cholesterol may control cholesterol levels.  Take any prescribed medicines to control cholesterol as directed by your health care provider.  Manage your diabetes.  Controlling your carbohydrate and sugar intake is recommended to manage diabetes.  Take any prescribed medicines to control diabetes as directed by your health care provider.  Control your hypertension.  Making food choices that are low in salt (sodium), saturated fat, trans fat, and cholesterol is recommended to manage hypertension.  Ask your health care provider if you need treatment to lower your blood pressure. Take any prescribed medicines to control hypertension as directed by your health care provider.  If you are 18-39 years of age, have your blood pressure checked every 3-5 years. If you are 40 years of age or older, have your blood pressure checked every year.  Maintain a healthy weight.  Reducing calorie intake and making food choices that are low in sodium, saturated fat, trans fat, and cholesterol are recommended to manage  weight.  Stop drug abuse.  Avoid taking birth control pills.  Talk to your health care provider about the risks of taking birth control pills if you are over 35 years old, smoke, get migraines, or have ever had a blood clot.  Get evaluated for sleep disorders (sleep apnea).  Talk to your health care provider about getting a sleep evaluation if you snore a lot or have excessive sleepiness.  Take medicines only as directed by your health care provider.  For some people, aspirin or blood thinners (anticoagulants) are helpful in reducing the risk of forming abnormal blood clots that can lead to stroke. If you have the irregular heart rhythm of atrial fibrillation, you should be on a blood thinner unless there is a good reason you cannot take them.  Understand all your medicine instructions.  Make sure that other conditions (such as anemia or atherosclerosis) are addressed. Get help right away if:  You have sudden weakness or numbness of the face, arm, or leg, especially on one side of the body.  Your face or eyelid droops to one side.  You have sudden confusion.  You have trouble speaking (aphasia) or understanding.  You have sudden trouble seeing in one or both eyes.  You have sudden trouble walking.  You have dizziness.  You have a loss of balance or coordination.  You have a sudden, severe headache with no known cause.  You have new chest pain or an irregular heartbeat. Any of these symptoms may represent a serious problem that is an emergency. Do not wait to see if the symptoms will go away.   Get medical help at once. Call your local emergency services (911 in [place] Do not drive yourself to the hospital. This information is not intended to replace advice given to you by your health care provider. Make sure you discuss any questions you have with your health care provider. Document Released: 08/04/2004 Document Revised: 12/03/2015 Document Reviewed: 12/28/2012 Elsevier  Interactive Patient Education  2017 [employer] Inc.  

## 2016-08-12 NOTE — Telephone Encounter (Signed)
Lmom scheduling echo for 08/25/2016 @930  please call back if time is inconvenien

## 2016-08-12 NOTE — Progress Notes (Signed)
Chief Complaint: Follow up Extracranial Carotid Artery Stenosis   History of Present Illness  Sophia Suarez is a 76 y.o. female patient of Dr. Imogene Burn who has known extracranial carotid artery stenosis. She returns today for routine surveillance.  Patient has not had previous carotid artery intervention.  She had a stroke about 2008 which manifested as dizziness, left facial drooping, left eye vision blurred, and left upper and lower extremity tingling and numbness, no weakness; denies any further strokes or TIA's.   Patient denies residual effects from her stroke other than a drawing feeling in left side of face when she gets very tired.   Patient states has has a known heart murmur since her first pregnancy.  Also states she has severe OA in her back and left knee.   Her neurologist, Dr. Oswaldo Conroy, told her that she had a TIA.  She was diagnosed with glaucoma and since this has been treated, her dizziness and nausea have resolved.   Her urinary incontinence is an issue for her.   Pt Diabetic: Yes, patient states in good control  Pt smoker: non-smoker   Pt meds include:  Statin : Yes  ASA: Yes  Other anticoagulants/antiplatelets: Plavix    Past Medical History:  Diagnosis Date  . Allergic rhinitis, seasonal   . Anemia   . Asthma   . Carotid artery occlusion   . Chronic pain   . Diabetes mellitus   . Diverticulitis   . GERD (gastroesophageal reflux disease)   . Heart murmur   . Hyperlipidemia   . Hypertension   . Lumbago   . Panic attacks   . Stroke [facility])     Social History Social History  Substance Use Topics  . Smoking status: Never Smoker  . Smokeless tobacco: Never Used  . Alcohol use 0.0 - 0.5 oz/week     Comment: occasionally; 1 drink monthly    Family History Family History  Problem Relation Age of Onset  . Aneurysm Mother     aortic  . Coronary artery disease Mother     CABG  . Cancer Mother   . Diabetes Mother     Amputation   . Heart disease Mother     AAA  . Hypertension Mother   . Hyperlipidemia Mother   . Cancer Father     Brain  . Hypertension Father   . Deep vein thrombosis Father   . Heart disease Father   . Hyperlipidemia Father   . Hypertension Brother   . Hypertension Son     Surgical History Past Surgical History:  Procedure Laterality Date  . ABDOMINAL HYSTERECTOMY  1973   Partial  . APPENDECTOMY  1958  . BIOPSY THYROID  Dec 05, 2013   Tumor- Benign  . COLECTOMY  2003   extent uncertain  . EYE SURGERY Right October 10, 2014   Cataract  . EYE SURGERY Left  October 17, 2014   Cataract  . KNEE ARTHROSCOPY Left   . OVARIAN CYST SURGERY    . PARTIAL HYSTERECTOMY      Allergies  Allergen Reactions  . Clindamycin/Lincomycin Anaphylaxis  . Septra [Sulfamethoxazole-Trimethoprim] Anaphylaxis  . Sulfa Antibiotics Anaphylaxis  . Melatonin Other (See Comments) and Hypertension    Over active   . Horse-Derived Products Itching  . Mold Extract [Trichophyton]     Get stopped up  . Pollen Extract     Stopped up    Current Outpatient Prescriptions  Medication Sig Dispense Refill  . albuterol (PROAIR  HFA) 108 (90 BASE) MCG/ACT inhaler Inhale 2 puffs into the lungs every 6 (six) hours as needed.    Marland Kitchen aspirin 81 MG tablet Take 81 mg by mouth daily.    . B Complex-C (B-COMPLEX WITH VITAMIN C) tablet Take 1 tablet by mouth daily. Reported on 08/07/2015    . BIOTIN PO Take 2,000 mg by mouth daily.    . clopidogrel (PLAVIX) 75 MG tablet TAKE 1 TABLET (75 MG TOTAL) BY MOUTH DAILY. 30 tablet 0  . co-enzyme Q-10 30 MG capsule Take 30 mg by mouth daily.    . diphenhydrAMINE (BENADRYL) 25 mg capsule Take 25 mg by mouth every 6 (six) hours as needed for itching or allergies.    Tery Sanfilippo Sodium (COLACE PO) Take 1 tablet by mouth daily as needed (for constipation).    . hydrochlorothiazide (MICROZIDE) 12.5 MG capsule Take 1 capsule (12.5 mg total) by mouth daily. *Please call and schedule a one year follow  up appointment* 90 capsule 0  . latanoprost (XALATAN) 0.005 % ophthalmic solution Place 1 drop into both eyes at bedtime.    Marland Kitchen losartan (COZAAR) 100 MG tablet Take 100 mg by mouth daily.     . Multiple Vitamin (MULTIVITAMIN) capsule Take 1 capsule by mouth daily.    Marland Kitchen MYRBETRIQ 25 MG TB24 tablet     . olopatadine (PATANOL) 0.1 % ophthalmic solution Place 1 drop into both eyes 2 (two) times daily.    . Omega-3 Fatty Acids (FISH OIL) 1200 MG CAPS Take 2 capsules by mouth daily.    Marland Kitchen omeprazole (PRILOSEC) 20 MG capsule Take 20 mg by mouth 2 (two) times daily.     Bertram Gala Glycol-Propyl Glycol (SYSTANE) 0.4-0.3 % SOLN Place 1 drop into both eyes daily as needed (for tired eyes).    . simvastatin (ZOCOR) 40 MG tablet Take 40 mg by mouth every evening.    . triamcinolone ointment (KENALOG) 0.1 % Apply 1 application topically 2 (two) times daily.    . vitamin B-12 (CYANOCOBALAMIN) 1000 MCG tablet Take 1,000 mcg by mouth daily.    Marland Kitchen EPINEPHrine (EPI-PEN) 0.3 mg/0.3 mL DEVI Inject 0.3 mLs (0.3 mg total) into the muscle once. (Patient not taking: Reported on 08/12/2016) 1 Device 1  . Glucosamine-Chondroitin (GLUCOSAMINE CHONDR COMPLEX) 500-400 MG CAPS Take 1,000 mg by mouth 2 (two) times daily. Reported on 08/07/2015    . meloxicam (MOBIC) 15 MG tablet Take 15 mg by mouth daily.     No current facility-administered medications for this visit.     Review of Systems : See HPI for pertinent positives and negatives.  Physical Examination  Vitals:   08/12/16 1047 08/12/16 1049  BP: 137/76 131/82  Pulse: 71   Resp: 18   Temp: 97 F (36.1 C)   TempSrc: Oral   SpO2: 96%   Weight: 157 lb (71.2 kg)   Height: 5' (1.524 m)    Body mass index is 30.66 kg/m.  General: WDWN obese female in NAD  GAIT: normal  Eyes: PERRLA  Pulmonary: Respirations are non labored, CTAB, good air movement in all fields.  Cardiac: regular rhythm, + low grade murmur.   VASCULAR EXAM  Carotid Bruits  Left   Right    Positive negative   Aorta is not palpable Radial pulses are 2+ palpable and equal.   LE Pulses  LEFT  RIGHT   POPLITEAL  Not palpable  Not palpable   POSTERIOR TIBIAL  palpable  not palpable   DORSALIS  PEDIS  ANTERIOR TIBIAL  Faintly palpable  Faintly palpable    Gastrointestinal: soft, nontender, BS WNL, no r/g, no palpable masses.  Musculoskeletal: Negative muscle atrophy/wasting. M/S 5/5 throughout, Extremities without ischemic changes.  Neurologic: A&O X 3; Appropriate Affect ; SENSATION ;normal;  Speech is normal  CN 2-12 intact, Pain and light touch intact in extremities, Motor exam as listed above.     Assessment: Harvie Junioratricia M Frid is a 76 y.o. female who had a mild stroke in 2008, none subsequently.  DATA Today's carotid duplex suggests 40-59% right ICA stenosis and minimal left ICA stenosis.  Bilateral vertebral artery flow is antegrade.  Bilateral subclavian artery waveforms are normal.  No significant change compared to prior exam.    Plan:  Follow-up in 1 year with Carotid Duplex scan.   I discussed in depth with the patient the nature of atherosclerosis, and emphasized the importance of maximal medical management including strict control of blood pressure, blood glucose, and lipid levels, obtaining regular exercise, and continued cessation of smoking.  The patient is aware that without maximal medical management the underlying atherosclerotic disease process will progress, limiting the benefit of any interventions. The patient was given information about stroke prevention and what symptoms should prompt the patient to seek immediate medical care. Thank you for allowing us to participate in this patient's care.  Charisse MarchSuzanne Nickel, RN, MSN, FNP-C [facility] Office: [phone]  Clinic Physician: Imogene BurnChen  08/12/16 11:09 AM

## 2016-08-25 ENCOUNTER — Other Ambulatory Visit (HOSPITAL_COMMUNITY): Payer: Medicare Other

## 2016-08-31 ENCOUNTER — Ambulatory Visit (HOSPITAL_COMMUNITY): Payer: Medicare Other | Attending: Internal Medicine

## 2016-08-31 ENCOUNTER — Other Ambulatory Visit: Payer: Self-pay

## 2016-08-31 DIAGNOSIS — J45909 Unspecified asthma, uncomplicated: Secondary | ICD-10-CM | POA: Diagnosis not present

## 2016-08-31 DIAGNOSIS — I350 Nonrheumatic aortic (valve) stenosis: Secondary | ICD-10-CM

## 2016-08-31 DIAGNOSIS — E119 Type 2 diabetes mellitus without complications: Secondary | ICD-10-CM | POA: Diagnosis not present

## 2016-08-31 DIAGNOSIS — I10 Essential (primary) hypertension: Secondary | ICD-10-CM | POA: Diagnosis not present

## 2016-08-31 DIAGNOSIS — E785 Hyperlipidemia, unspecified: Secondary | ICD-10-CM | POA: Insufficient documentation

## 2016-08-31 DIAGNOSIS — Z8673 Personal history of transient ischemic attack (TIA), and cerebral infarction without residual deficits: Secondary | ICD-10-CM | POA: Insufficient documentation

## 2016-09-01 ENCOUNTER — Telehealth: Payer: Self-pay | Admitting: Cardiovascular Disease

## 2016-09-01 NOTE — Telephone Encounter (Signed)
Called patient with echo results.

## 2016-09-01 NOTE — Telephone Encounter (Signed)
New Message    Please call pt back at 230pm regarding the results for her echo test

## 2016-10-27 NOTE — Progress Notes (Signed)
Patient ID: Sophia Suarez, female   DOB: [birth year], 76 y.o.   MRN: [id number]   76 y.o. first seen by me 5/14 . Has had TIAls with borderline carotid lesion. Sees Dr Marjory Lies as well. Reviewed CTA from 4/26 and RICA has a ? Of 55-60% stenosis not typically bad enough to operate on.   No history of CAD. Active with no chest pain. Long standing murmur. Echo done 3/15  reviewed  Mild AS mean gradient 8 peak 16 mmHg  Last echo 08/31/16 reviewed: EF 60-65% mild AS mean gradient 11 mmHg and peak 25 mmHg  08/21/13 had normal myovue with no ischemia or infarction  08/12/16    40-59% RICA stenosis done at [facility]    BP fine on diuretic and ARB    ROS: Denies fever, malais, weight loss, blurry vision, decreased visual acuity, cough, sputum, SOB, hemoptysis, pleuritic pain, palpitaitons, heartburn, abdominal pain, melena, lower extremity edema, claudication, or rash.  All other systems reviewed and negative  General: Affect appropriate Overweight white female  HEENT: normal Neck supple with no adenopathy JVP normal right  bruits no thyromegaly Lungs clear with no wheezing and good diaphragmatic motion Heart:  S1/S2 midl AS  murmur, no rub, gallop or click PMI normal Abdomen: benighn, BS positve, no tenderness, no AAA no bruit.  No HSM or HJR Distal pulses intact with no bruits No edema Neuro non-focal Skin warm and dry No muscular weakness   Current Outpatient Prescriptions  Medication Sig Dispense Refill  . aspirin 81 MG tablet Take 81 mg by mouth daily.    . B Complex-C (B-COMPLEX WITH VITAMIN C) tablet Take 1 tablet by mouth daily. Reported on 08/07/2015    . BIOTIN PO Take 2,000 mg by mouth daily.    . brimonidine (ALPHAGAN) 0.2 % ophthalmic solution Place 1 drop into both eyes 2 (two) times daily.    . Cholecalciferol (VITAMIN D-3) 1000 units CAPS Take 1 capsule by mouth daily.    . clopidogrel (PLAVIX) 75 MG tablet TAKE 1 TABLET (75 MG TOTAL) BY MOUTH DAILY. 30 tablet 0  . co-enzyme Q-10  30 MG capsule Take 30 mg by mouth daily.    . diphenhydrAMINE (BENADRYL) 25 mg capsule Take 25 mg by mouth every 6 (six) hours as needed for itching or allergies.    Tery Sanfilippo Calcium (STOOL SOFTENER PO) Take 1 capsule by mouth daily as needed.    Tery Sanfilippo Sodium (COLACE PO) Take 1 tablet by mouth daily as needed (for constipation).    Marland Kitchen EPINEPHrine (EPI-PEN) 0.3 mg/0.3 mL DEVI Inject 0.3 mLs (0.3 mg total) into the muscle once. 1 Device 1  . Glucosamine-Chondroitin (GLUCOSAMINE CHONDR COMPLEX) 500-400 MG CAPS Take 1,000 mg by mouth 2 (two) times daily. Reported on 08/07/2015    . hydrochlorothiazide (MICROZIDE) 12.5 MG capsule Take 1 capsule (12.5 mg total) by mouth daily. *Please call and schedule a one year follow up appointment* 90 capsule 0  . losartan (COZAAR) 100 MG tablet Take 100 mg by mouth daily.     . Multiple Vitamin (MULTIVITAMIN) capsule Take 1 capsule by mouth daily.    Marland Kitchen MYRBETRIQ 25 MG TB24 tablet     . olopatadine (PATANOL) 0.1 % ophthalmic solution Place 1 drop into both eyes 2 (two) times daily.    . Omega-3 Fatty Acids (FISH OIL) 1200 MG CAPS Take 2 capsules by mouth daily.    Marland Kitchen omeprazole (PRILOSEC) 20 MG capsule Take 20 mg by mouth 2 (two) times daily.     Marland Kitchen  OVER THE COUNTER MEDICATION Take 1 tablet by mouth daily. Move [employer]    . Polyethyl Glycol-Propyl Glycol (SYSTANE) 0.4-0.3 % SOLN Place 1 drop into both eyes daily as needed (for tired eyes).    . Probiotic Product (PROBIOTIC DAILY PO) Take 1 capsule by mouth daily.    . simvastatin (ZOCOR) 40 MG tablet Take 40 mg by mouth every evening.    . triamcinolone ointment (KENALOG) 0.1 % Apply 1 application topically 2 (two) times daily.    . vitamin B-12 (CYANOCOBALAMIN) 1000 MCG tablet Take 1,000 mcg by mouth daily.     No current facility-administered medications for this visit.     Allergies  Clindamycin/lincomycin; Septra [sulfamethoxazole-trimethoprim]; Sulfa antibiotics; Melatonin; Horse-derived  products; Mold extract [trichophyton]; and Pollen extract  Electrocardiogram:  2015  SR rate 69 normal  08/14/15  SR rate 77 normal ECG 10/28/16  SR rate 72 possible old IMI nonspecific St changes   Assessment and Plan AS: mild f/u echo 09/2017 no change in murmur no need for SBE Carotid   40-59% RICA stenosis.  F/U duplex 08/2017 HTN: resume diuretic continue ARB  Discussed diet and weight loss    Charlton Haws

## 2016-10-28 ENCOUNTER — Encounter (INDEPENDENT_AMBULATORY_CARE_PROVIDER_SITE_OTHER): Payer: Self-pay

## 2016-10-28 ENCOUNTER — Encounter: Payer: Self-pay | Admitting: Cardiovascular Disease

## 2016-10-28 ENCOUNTER — Ambulatory Visit (INDEPENDENT_AMBULATORY_CARE_PROVIDER_SITE_OTHER): Payer: Medicare Other | Admitting: Cardiovascular Disease

## 2016-10-28 VITALS — BP 112/82 | HR 72 | Ht 59.0 in | Wt 157.8 lb

## 2016-10-28 DIAGNOSIS — I350 Nonrheumatic aortic (valve) stenosis: Secondary | ICD-10-CM

## 2016-10-28 NOTE — Patient Instructions (Signed)
Your physician wants you to follow-up in: 1 year. You will receive a reminder letter in the mail two months in advance. If you don't receive a letter, please call our office to schedule the follow-up appointment.  

## 2016-11-03 ENCOUNTER — Other Ambulatory Visit: Payer: Self-pay | Admitting: Cardiovascular Disease

## 2017-08-08 NOTE — Progress Notes (Signed)
Patient ID: Sophia Suarez, female   DOB: May 22, 1941, 77 y.o.   MRN: [id number]   77 y.o. first seen by me 5/14 . Has had TIAls with borderline carotid lesion. Sees Dr Marjory Lies as well. Reviewed CTA from 4/26 and RICA has a ? Of 55-60% stenosis not typically bad enough to operate on.   No history of CAD. Active with no chest pain. Long standing murmur.   Last echo 08/31/16 reviewed: EF 60-65% mild AS mean gradient 11 mmHg and peak 25 mmHg 08/21/13 had normal myovue with no ischemia or infarction  08/12/16    40-59% RICA stenosis done at [facility]    BP fine on diuretic and ARB   She needs clearance for left TKR with Dr Turner Daniels. She has no chest pain. She has lost 30 lbs And walks her dog with no dyspnea able to do well over 4 METS  ROS: Denies fever, malais, weight loss, blurry vision, decreased visual acuity, cough, sputum, SOB, hemoptysis, pleuritic pain, palpitaitons, heartburn, abdominal pain, melena, lower extremity edema, claudication, or rash.  All other systems reviewed and negative  General: BP (!) 142/78   Pulse 68   Ht 4' 10.5" (1.486 m)   Wt 156 lb 8 oz (71 kg)   BMI 32.15 kg/m  Affect appropriate Healthy:  appears stated age HEENT: normal Neck supple with no adenopathy JVP normal no bruits no thyromegaly Lungs clear with no wheezing and good diaphragmatic motion Heart:  S1/S2 mild AS  murmur, no rub, gallop or click PMI normal Abdomen: benighn, BS positve, no tenderness, no AAA no bruit.  No HSM or HJR Distal pulses intact with no bruits No edema Neuro non-focal Skin warm and dry No muscular weakness    Current Outpatient Medications  Medication Sig Dispense Refill  . aspirin 81 MG tablet Take 81 mg by mouth daily.    . B Complex-C (B-COMPLEX WITH VITAMIN C) tablet Take 1 tablet by mouth daily. Reported on 08/07/2015    . BIOTIN PO Take 2,000 mg by mouth daily.    . brimonidine (ALPHAGAN) 0.2 % ophthalmic solution Place 1 drop into both eyes 2 (two) times daily.    .  Cholecalciferol (VITAMIN D-3) 1000 units CAPS Take 1 capsule by mouth daily.    . clopidogrel (PLAVIX) 75 MG tablet TAKE 1 TABLET (75 MG TOTAL) BY MOUTH DAILY. 30 tablet 0  . co-enzyme Q-10 30 MG capsule Take 30 mg by mouth daily.    . diphenhydrAMINE (BENADRYL) 25 mg capsule Take 25 mg by mouth every 6 (six) hours as needed for itching or allergies.    Tery Sanfilippo Calcium (STOOL SOFTENER PO) Take 1 capsule by mouth daily as needed.    Tery Sanfilippo Sodium (COLACE PO) Take 1 tablet by mouth daily as needed (for constipation).    . hydrochlorothiazide (MICROZIDE) 12.5 MG capsule Take 1 capsule (12.5 mg total) by mouth daily. 90 capsule 3  . losartan (COZAAR) 100 MG tablet Take 100 mg by mouth daily.     . Multiple Vitamin (MULTIVITAMIN) capsule Take 1 capsule by mouth daily.    Marland Kitchen olopatadine (PATANOL) 0.1 % ophthalmic solution Place 1 drop into both eyes 2 (two) times daily.    . Omega-3 Fatty Acids (FISH OIL) 1200 MG CAPS Take 2 capsules by mouth daily.    Marland Kitchen omeprazole (PRILOSEC) 20 MG capsule Take 20 mg by mouth 2 (two) times daily.     Marland Kitchen OVER THE COUNTER MEDICATION Take 1 tablet by mouth daily.  Move Free Joint Health    . Polyethyl Glycol-Propyl Glycol (SYSTANE) 0.4-0.3 % SOLN Place 1 drop into both eyes daily as needed (for tired eyes).    . Probiotic Product (PROBIOTIC DAILY PO) Take 1 capsule by mouth daily.    . simvastatin (ZOCOR) 40 MG tablet Take 40 mg by mouth every evening.    . triamcinolone ointment (KENALOG) 0.1 % Apply 1 application topically 2 (two) times daily.    . vitamin B-12 (CYANOCOBALAMIN) 1000 MCG tablet Take 1,000 mcg by mouth daily.     No current facility-administered medications for this visit.     Allergies  Clindamycin/lincomycin; Septra [sulfamethoxazole-trimethoprim]; Sulfa antibiotics; Melatonin; Horse-derived products; Mold extract [trichophyton]; and Pollen extract  Electrocardiogram:  2015  SR rate 69 normal  08/14/15  SR rate 77 normal ECG 10/28/16  SR rate 72  possible old IMI nonspecific St changes 08/11/17 SR rate 63 poor R wave progression   Assessment and Plan AS: mild f/u echo for AS before her left TKR  Carotid   40-59% RICA stenosis.  F/U duplex before her knee surgery  HTN: Better with diuretic  continue ARB  Discussed diet and weight loss   Clear to have left TKR with Dr Dionisio Paschalowan   Sophia Suarez

## 2017-08-09 ENCOUNTER — Telehealth: Payer: Self-pay | Admitting: *Deleted

## 2017-08-09 NOTE — Telephone Encounter (Signed)
   [facility] Pre-operative Risk Assessment    Request for surgical clearance:  1. What type of surgery is being performed? Left TKA, Arthroscopy  2. When is this surgery scheduled? Pending Approval  3. What type of clearance is required (medical clearance vs. Pharmacy clearance to hold med vs. Both)? Both  4. Are there any medications that need to be held prior to surgery and how long? Pending Approval per Cardiologist  5. Practice name and name of physician performing surgery? [facility], Dr Frederik Pear  6. What is your office phone and fax number? P# [phone], F# [place]  7. Anesthesia type (None, local, MAC, general) ? Not stated   Sophia Suarez 08/09/2017, 11:36 AM  _________________________________________________________________   (provider comments below)

## 2017-08-10 ENCOUNTER — Encounter: Payer: Self-pay | Admitting: Cardiovascular Disease

## 2017-08-10 NOTE — Telephone Encounter (Signed)
Called pt to get her an appt set up for her cardiac clearance.  Pt has already been scheduled for 08/11/17 with Dr. Eden EmmsNishan.

## 2017-08-10 NOTE — Telephone Encounter (Signed)
   Primary Cardiologist:No primary care provider on file.  Chart reviewed as part of pre-operative protocol coverage. Because of Sophia Suarez's past medical history and time since last visit, he/she will require a follow-up visit in order to better assess preoperative cardiovascular risk.  Pre-op covering staff: - Please schedule appointment and call patient to inform them. - Please contact requesting surgeon's office via preferred method (i.e, phone, fax) to inform them of need for appointment prior to surgery.  Sophia BoozerLaura Lavern Crimi, NP  08/10/2017, 3:45 PM

## 2017-08-11 ENCOUNTER — Encounter: Payer: Self-pay | Admitting: Cardiovascular Disease

## 2017-08-11 ENCOUNTER — Ambulatory Visit: Payer: Medicare Other | Admitting: Cardiovascular Disease

## 2017-08-11 VITALS — BP 142/78 | HR 68 | Ht 58.5 in | Wt 156.5 lb

## 2017-08-11 DIAGNOSIS — I6523 Occlusion and stenosis of bilateral carotid arteries: Secondary | ICD-10-CM | POA: Diagnosis not present

## 2017-08-11 DIAGNOSIS — I350 Nonrheumatic aortic (valve) stenosis: Secondary | ICD-10-CM

## 2017-08-11 DIAGNOSIS — I10 Essential (primary) hypertension: Secondary | ICD-10-CM

## 2017-08-11 NOTE — Patient Instructions (Addendum)
Medication Instructions:  Your physician recommends that you continue on your current medications as directed. Please refer to the Current Medication list given to you today.  Labwork: NONE  Testing/Procedures: Your physician has requested that you have an echocardiogram. Echocardiography is a painless test that uses sound waves to create images of your heart. It provides your doctor with information about the size and shape of your heart and how well your heart's chambers and valves are working. This procedure takes approximately one hour. There are no restrictions for this procedure.  Your physician has requested that you have a carotid duplex. This test is an ultrasound of the carotid arteries in your neck. It looks at blood flow through these arteries that supply the brain with blood. Allow one hour for this exam. There are no restrictions or special instructions.  Follow-Up: Your physician wants you to follow-up in: 6 months with Dr. Nishan. You will receive a reminder letter in the mail two months in advance. If you don't receive a letter, please call our office to schedule the follow-up appointment.   If you need a refill on your cardiac medications before your next appointment, please call your pharmacy.    

## 2017-08-15 ENCOUNTER — Ambulatory Visit: Payer: Medicare Other | Admitting: Family

## 2017-08-15 ENCOUNTER — Encounter: Payer: Self-pay | Admitting: Family

## 2017-08-15 ENCOUNTER — Ambulatory Visit (HOSPITAL_COMMUNITY)
Admission: RE | Admit: 2017-08-15 | Discharge: 2017-08-15 | Disposition: A | Discharge status: Home / Self Care | Payer: Medicare Other | Source: Ambulatory Visit | Attending: Family | Admitting: Family

## 2017-08-15 VITALS — BP 151/80 | HR 65 | Temp 97.0°F | Resp 20 | Ht 58.5 in | Wt 158.0 lb

## 2017-08-15 DIAGNOSIS — I6523 Occlusion and stenosis of bilateral carotid arteries: Secondary | ICD-10-CM | POA: Diagnosis present

## 2017-08-15 NOTE — Patient Instructions (Signed)

## 2017-08-15 NOTE — Progress Notes (Signed)
Chief Complaint: Follow up Extracranial Carotid Artery Stenosis   History of Present Illness  Sophia Suarez is a 77 y.o. female whom Dr. Imogene Burnhen has been monitoring for extracranial carotid artery stenosis. She returns today for routine surveillance.  Patient has not had previous carotid artery intervention.  She had a stroke about 2008 which manifested as dizziness, left facial drooping, left eye vision blurred, and left upper and lower extremity tingling and numbness, no weakness; denies any further strokes or TIA's.   Patient denies residual neurological deficits from her stroke other than a drawing feeling in left side of face when she gets very tired.  She has a known heart murmur since her first pregnancy.  Also states she has severe OA in her back and left knee, is scheduled for left knee replacement in March 2019.    Her neurologist, Dr. Danae OrleansPenumali, told her that she had a TIA.  She was diagnosed with glaucoma and since this has been treated, her dizziness and nausea have resolved.   Her urinary incontinence is an issue for her.   Pt Diabetic: Yes, last A1C result on file was 6.5 in April 2018 Pt smoker: non-smoker  Pt meds include:  Statin : Yes  ASA: Yes  Other anticoagulants/antiplatelets: Plavix    Past Medical History:  Diagnosis Date  . Allergic rhinitis, seasonal   . Anemia   . Asthma   . Carotid artery occlusion   . Chronic pain   . Diabetes mellitus   . Diverticulitis   . GERD (gastroesophageal reflux disease)   . Heart murmur   . Hyperlipidemia   . Hypertension   . Lumbago   . Panic attacks   . Stroke [facility](HCC)     Social History Social History   Tobacco Use  . Smoking status: Never Smoker  . Smokeless tobacco: Never Used  Substance Use Topics  . Alcohol use: Yes    Alcohol/week: 0.0 - 0.5 oz    Comment: occasionally; 1 drink monthly  . Drug use: No    Family History Family History  Problem Relation Age of Onset  .  Aneurysm Mother        aortic  . Coronary artery disease Mother        CABG  . Cancer Mother   . Diabetes Mother        Amputation  . Heart disease Mother        AAA  . Hypertension Mother   . Hyperlipidemia Mother   . Cancer Father        Brain  . Hypertension Father   . Deep vein thrombosis Father   . Heart disease Father   . Hyperlipidemia Father   . Hypertension Brother   . Hypertension Son     Surgical History Past Surgical History:  Procedure Laterality Date  . ABDOMINAL HYSTERECTOMY  1973   Partial  . APPENDECTOMY  1958  . BIOPSY THYROID  Dec 05, 2013   Tumor- Benign  . COLECTOMY  2003   extent uncertain  . EYE SURGERY Right October 10, 2014   Cataract  . EYE SURGERY Left  October 17, 2014   Cataract  . KNEE ARTHROSCOPY Left   . OVARIAN CYST SURGERY    . PARTIAL HYSTERECTOMY      Allergies  Allergen Reactions  . Clindamycin/Lincomycin Anaphylaxis  . Septra [Sulfamethoxazole-Trimethoprim] Anaphylaxis  . Sulfa Antibiotics Anaphylaxis  . Melatonin Other (See Comments) and Hypertension    Over active   . Horse-Derived  Products Itching  . Mold Extract [Trichophyton]     Get stopped up  . Pollen Extract     Stopped up    Current Outpatient Medications  Medication Sig Dispense Refill  . alendronate (FOSAMAX) 70 MG tablet Take 70 mg by mouth once a week. Take with a full glass of water on an empty stomach.    Marland Kitchen aspirin 81 MG tablet Take 81 mg by mouth daily.    . B Complex-C (B-COMPLEX WITH VITAMIN C) tablet Take 1 tablet by mouth daily. Reported on 08/07/2015    . BIOTIN PO Take 2,000 mg by mouth daily.    . clopidogrel (PLAVIX) 75 MG tablet TAKE 1 TABLET (75 MG TOTAL) BY MOUTH DAILY. 30 tablet 0  . co-enzyme Q-10 30 MG capsule Take 30 mg by mouth daily.    . diphenhydrAMINE (BENADRYL) 25 mg capsule Take 25 mg by mouth every 6 (six) hours as needed for itching or allergies.    Tery Sanfilippo Sodium (COLACE PO) Take 1 tablet by mouth daily as needed (for  constipation).    . dorzolamide (TRUSOPT) 2 % ophthalmic solution 1 drop 3 (three) times daily.    . hydrochlorothiazide (MICROZIDE) 12.5 MG capsule Take 1 capsule (12.5 mg total) by mouth daily. 90 capsule 3  . losartan (COZAAR) 100 MG tablet Take 100 mg by mouth daily.     . Multiple Vitamin (MULTIVITAMIN) capsule Take 1 capsule by mouth daily.    . Omega-3 Fatty Acids (FISH OIL) 1200 MG CAPS Take 2 capsules by mouth daily.    Marland Kitchen omeprazole (PRILOSEC) 20 MG capsule Take 20 mg by mouth 2 (two) times daily.     Marland Kitchen OVER THE COUNTER MEDICATION Take 1 tablet by mouth daily. Move Free Joint Health    . Probiotic Product (PROBIOTIC DAILY PO) Take 1 capsule by mouth daily.    . simvastatin (ZOCOR) 40 MG tablet Take 40 mg by mouth every evening.    Marland Kitchen TIMOLOL MALEATE PO Take by mouth.    . vitamin B-12 (CYANOCOBALAMIN) 1000 MCG tablet Take 1,000 mcg by mouth daily.    . brimonidine (ALPHAGAN) 0.2 % ophthalmic solution Place 1 drop into both eyes 2 (two) times daily.     No current facility-administered medications for this visit.     Review of Systems : See HPI for pertinent positives and negatives.  Physical Examination  Vitals:   08/15/17 1055 08/15/17 1057  BP: 135/73 (!) 151/80  Pulse: 65   Resp: 20   Temp: (!) 97 F (36.1 C)   TempSrc: Oral   SpO2: 94%   Weight: 158 lb (71.7 kg)   Height: 4' 10.5" (1.486 m)    Body mass index is 32.46 kg/m.  General: WDWN obese female in NAD  GAIT:steady, antalgic, using cane HENT: No gross abnormalities.  Eyes: PERRLA  Pulmonary: Respirations are non labored, CTAB, good air movement in all fields.  Cardiac: regular rhythm, no appreciable murmur  VASCULAR EXAM Carotid Bruits Left Right   Positive negative   Abdominal aortic pulse is not palpable Radial pulses are 2+ palpable and equal.   LE Pulses  LEFT  RIGHT   POPLITEAL  Not palpable  Not palpable  POSTERIOR TIBIAL  Not palpable  not palpable    DORSALIS PEDIS ANTERIOR TIBIAL  1+ palpable  1+palpable    Gastrointestinal: soft, nontender, BS WNL, no r/g, no palpable masses.  Musculoskeletal: No muscle atrophy/wasting. M/S 5/5 throughout, Extremities without ischemic changes.  Neurologic: A&O  X 3; appropriate affect ;, normal sensation, CN 2-12 intact, Pain and light touch intact in extremities, Motor exam as listed above. Skin: No rash, no ulcers, no cellulitis.  Psychiatric: Normal thought content, mood appropriate to clinical situation.    Assessment: Sophia Suarez is a 77 y.o. female who had a mild stroke in 2008, none subsequently.  DATA Carotid Duplex (08/15/17): 40-59% right ICA stenosis >50% right ECA stenosis  l-39% left ICA stenosis.  Bilateral vertebral artery flow is antegrade.  Bilateral subclavian artery waveforms are normal.  No significant change compared to exam on 08-12-16.    Plan: Follow-up in 1year with Carotid Duplex scan.    I discussed in depth with the patient the nature of atherosclerosis, and emphasized the importance of maximal medical management including strict control of blood pressure, blood glucose, and lipid levels, obtaining regular exercise, and continued cessation of smoking.  The patient is aware that without maximal medical management the underlying atherosclerotic disease process will progress, limiting the benefit of any interventions. The patient was given information about stroke prevention and what symptoms should prompt the patient to seek immediate medical care. Thank you for allowing [place] to participate in this patient's care.  Charisse March, RN, MSN, FNP-C [facility] Office: [phone]  Clinic Physician: Early  08/15/17 11:13 AM

## 2017-08-17 ENCOUNTER — Other Ambulatory Visit (HOSPITAL_COMMUNITY): Payer: Medicare Other

## 2017-08-22 ENCOUNTER — Telehealth: Payer: Self-pay

## 2017-08-22 ENCOUNTER — Ambulatory Visit (HOSPITAL_COMMUNITY): Payer: Medicare Other | Attending: Cardiology

## 2017-08-22 ENCOUNTER — Other Ambulatory Visit: Payer: Self-pay

## 2017-08-22 DIAGNOSIS — I119 Hypertensive heart disease without heart failure: Secondary | ICD-10-CM | POA: Insufficient documentation

## 2017-08-22 DIAGNOSIS — I350 Nonrheumatic aortic (valve) stenosis: Secondary | ICD-10-CM

## 2017-08-22 DIAGNOSIS — I6523 Occlusion and stenosis of bilateral carotid arteries: Secondary | ICD-10-CM | POA: Insufficient documentation

## 2017-08-22 DIAGNOSIS — I10 Essential (primary) hypertension: Secondary | ICD-10-CM

## 2017-08-22 LAB — ECHOCARDIOGRAM COMPLETE
AO mean calculated velocity dopler: 168 cm/s
AOASC: 30 cm
AOPV: 0.47 m/s
AOVTI: 52.4 cm
AV Area VTI: 1.07 cm2
AV Area mean vel: 1.16 cm2
AV Peak grad: 26 mmHg
AV VEL mean LVOT/AV: 0.51
AV area mean vel ind: 0.7 cm2/m2
AV peak Index: 0.64
AVAREAVTIIND: 0.63 cm2/m2
AVG: 13 mmHg
AVPKVEL: 254 cm/s
CHL CUP AV VEL: 1.05
CHL CUP RV SYS PRESS: 23 mmHg
EERAT: 18.15
EWDT: 345 ms
FS: 39 % (ref 28–44)
IVS/LV PW RATIO, ED: 1.05
LA ID, A-P, ES: 41 mm
LA vol index: 24.1 mL/m2
LADIAMINDEX: 2.47 cm/m2
LAVOL: 40 mL
LAVOLA4C: 39 mL
LEFT ATRIUM END SYS DIAM: 41 mm
LV E/e'average: 18.15
LV SIMPSON'S DISK: 64
LV TDI E'LATERAL: 4
LV TDI E'MEDIAL: 6.14
LV dias vol index: 37 mL/m2
LVDIAVOL: 61 mL (ref 46–106)
LVEEMED: 18.15
LVELAT: 4 cm/s
LVOT VTI: 24.3 cm
LVOT area: 2.27 cm2
LVOT diameter: 17 mm
LVOT peak grad rest: 6 mmHg
LVOT peak vel: 120 cm/s
LVOTSV: 55 mL
LVOTVTI: 0.46 cm
LVSYSVOL: 22 mL (ref 14–42)
LVSYSVOLIN: 13 mL/m2
MV Dec: 345
MV Peak grad: 2 mmHg
MV pk E vel: 72.6 m/s
MVPKAVEL: 105 m/s
PW: 14.2 mm — AB (ref 0.6–1.1)
RV LATERAL S' VELOCITY: 11.8 cm/s
Reg peak vel: 225 cm/s
Stroke v: 39 ml
TRMAXVEL: 225 cm/s
Valve area index: 0.63
Valve area: 1.05 cm2

## 2017-08-22 NOTE — Telephone Encounter (Signed)
-----   Message from Wendall StadePeter C Nishan, MD sent at 08/22/2017  4:03 PM EST ----- Normal EF mild AS overall ok f/u echo in a year

## 2017-08-22 NOTE — Telephone Encounter (Signed)
Patient aware of results. Placed order for patient to have echo in one year.

## 2017-09-01 ENCOUNTER — Other Ambulatory Visit: Payer: Self-pay | Admitting: Orthopedic Surgery

## 2017-09-04 NOTE — Pre-Procedure Instructions (Signed)
Sophia Formosaatricia M Suarez  09/04/2017      [id number] - [place], [place] - [address] AT [address] OF [facility] [address] [place] [id number] Phone: [phone] Fax: [phone]    Your procedure is scheduled on March 8  [facility] at 0530 [facility].M[facility]  Call this number if you have problems the morning of surgery:  [phone]   Remember:  Do not eat food or drink liquids after midnight.  Continue all medications as directed by your physician except follow these medication instructions before surgery below   Take these medicines the morning of surgery with A SIP OF WATER  Eye drops omeprazole (PRILOSEC  7 days prior to surgery STOP taking any Aspirin(unless otherwise instructed by your surgeon), Aleve, Naproxen, Ibuprofen, Motrin, Advil, Goody's, BC's, all herbal medications, fish oil, and all vitamins  Follow your doctors instructions regarding your Aspirin.  If no instructions were given by your doctor, then you will need to call the prescribing office office to get instructions.     FOLLOW PHYSICIANS INSTRUCTIONS ABOUT PLAVIX   Do not wear jewelry, make-up or nail polish.  Do not wear lotions, powders, or perfumes, or deodorant.  Do not shave 48 hours prior to surgery.    Do not bring valuables to the hospital.  [facility] is not responsible for any belongings or valuables.  Contacts, dentures or bridgework may not be worn into surgery.  Leave your suitcase in the car.  After surgery it may be brought to your room.  For patients admitted to the hospital, discharge time will be determined by your treatment team.  Patients discharged the day of surgery will not be allowed to drive home.    Special instructions:   [place]- Preparing For Surgery  Before surgery, you can play an important role. Because skin is not sterile, your skin needs to be as free of germs as possible. You can reduce the number of germs  on your skin by washing with CHG (chlorahexidine gluconate) Soap before surgery.  CHG is an antiseptic cleaner which kills germs and bonds with the skin to continue killing germs even after washing.  Please do not use if you have an allergy to CHG or antibacterial soaps. If your skin becomes reddened/irritated stop using the CHG.  Do not shave (including legs and underarms) for at least 48 hours prior to first CHG shower. It is OK to shave your face.  Please follow these instructions carefully.   1. Shower the NIGHT BEFORE SURGERY and the MORNING OF SURGERY with CHG.   2. If you chose to wash your hair, wash your hair first as usual with your normal shampoo.  3. After you shampoo, rinse your hair and body thoroughly to remove the shampoo.  4. Use CHG as you would any other liquid soap. You can apply CHG directly to the skin and wash gently with a scrungie or a clean washcloth.   5. Apply the CHG Soap to your body ONLY FROM THE NECK DOWN.  Do not use on open wounds or open sores. Avoid contact with your eyes, ears, mouth and genitals (private parts). Wash Face and genitals (private parts)  with your normal soap.  6. Wash thoroughly, paying special attention to the area where your surgery will be performed.  7. Thoroughly rinse your body with warm water from the neck down.  8. DO NOT shower/wash with your normal soap after using and rinsing off  the CHG Soap.  9. Pat yourself dry with a CLEAN TOWEL.  10. Wear CLEAN PAJAMAS to bed the night before surgery, wear comfortable clothes the morning of surgery  11. Place CLEAN SHEETS on your bed the night of your first shower and DO NOT SLEEP WITH PETS.    Day of Surgery: Do not apply any deodorants/lotions. Please wear clean clothes to the hospital/surgery center.      Please read over the following fact sheets that you were given.

## 2017-09-05 ENCOUNTER — Ambulatory Visit (HOSPITAL_COMMUNITY)
Admission: RE | Admit: 2017-09-05 | Discharge: 2017-09-05 | Disposition: A | Discharge status: Home / Self Care | Payer: Medicare Other | Source: Ambulatory Visit | Attending: Orthopedic Surgery | Admitting: Orthopedic Surgery

## 2017-09-05 ENCOUNTER — Other Ambulatory Visit: Payer: Self-pay

## 2017-09-05 ENCOUNTER — Encounter (HOSPITAL_COMMUNITY)
Admission: RE | Admit: 2017-09-05 | Discharge: 2017-09-05 | Disposition: A | Discharge status: Home / Self Care | Payer: Medicare Other | Source: Ambulatory Visit | Attending: Orthopedic Surgery | Admitting: Orthopedic Surgery

## 2017-09-05 ENCOUNTER — Encounter (HOSPITAL_COMMUNITY): Payer: Self-pay

## 2017-09-05 DIAGNOSIS — D649 Anemia, unspecified: Secondary | ICD-10-CM | POA: Insufficient documentation

## 2017-09-05 DIAGNOSIS — Z01818 Encounter for other preprocedural examination: Secondary | ICD-10-CM | POA: Diagnosis not present

## 2017-09-05 DIAGNOSIS — N189 Chronic kidney disease, unspecified: Secondary | ICD-10-CM | POA: Insufficient documentation

## 2017-09-05 DIAGNOSIS — E785 Hyperlipidemia, unspecified: Secondary | ICD-10-CM | POA: Diagnosis not present

## 2017-09-05 DIAGNOSIS — K219 Gastro-esophageal reflux disease without esophagitis: Secondary | ICD-10-CM | POA: Diagnosis not present

## 2017-09-05 DIAGNOSIS — E1122 Type 2 diabetes mellitus with diabetic chronic kidney disease: Secondary | ICD-10-CM | POA: Diagnosis not present

## 2017-09-05 DIAGNOSIS — Z79899 Other long term (current) drug therapy: Secondary | ICD-10-CM | POA: Diagnosis not present

## 2017-09-05 DIAGNOSIS — Z7982 Long term (current) use of aspirin: Secondary | ICD-10-CM | POA: Diagnosis not present

## 2017-09-05 DIAGNOSIS — Z7902 Long term (current) use of antithrombotics/antiplatelets: Secondary | ICD-10-CM | POA: Diagnosis not present

## 2017-09-05 DIAGNOSIS — I129 Hypertensive chronic kidney disease with stage 1 through stage 4 chronic kidney disease, or unspecified chronic kidney disease: Secondary | ICD-10-CM | POA: Insufficient documentation

## 2017-09-05 DIAGNOSIS — Z8673 Personal history of transient ischemic attack (TIA), and cerebral infarction without residual deficits: Secondary | ICD-10-CM | POA: Insufficient documentation

## 2017-09-05 HISTORY — DX: Chronic kidney disease, unspecified: N18.9

## 2017-09-05 HISTORY — DX: Unspecified osteoarthritis, unspecified site: M19.90

## 2017-09-05 HISTORY — DX: Adverse effect of unspecified anesthetic, initial encounter: T41.45XA

## 2017-09-05 HISTORY — DX: Other complications of anesthesia, initial encounter: T88.59XA

## 2017-09-05 HISTORY — DX: Dyspnea, unspecified: R06.00

## 2017-09-05 HISTORY — DX: Unspecified urinary incontinence: R32

## 2017-09-05 LAB — CBC WITH DIFFERENTIAL/PLATELET
BASOS PCT: 0 %
Basophils Absolute: 0 10*3/uL (ref 0.0–0.1)
Eosinophils Absolute: 0.4 10*3/uL (ref 0.0–0.7)
Eosinophils Relative: 4 %
HEMATOCRIT: 42.8 % (ref 36.0–46.0)
Hemoglobin: 14.1 g/dL (ref 12.0–15.0)
Lymphocytes Relative: 42 %
Lymphs Abs: 4.4 10*3/uL — ABNORMAL HIGH (ref 0.7–4.0)
MCH: 32.6 pg (ref 26.0–34.0)
MCHC: 32.9 g/dL (ref 30.0–36.0)
MCV: 99.1 fL (ref 78.0–100.0)
Monocytes Absolute: 0.4 10*3/uL (ref 0.1–1.0)
Monocytes Relative: 4 %
NEUTROS ABS: 5.3 10*3/uL (ref 1.7–7.7)
Neutrophils Relative %: 50 %
Platelets: 280 10*3/uL (ref 150–400)
RBC: 4.32 MIL/uL (ref 3.87–5.11)
RDW: 12.7 % (ref 11.5–15.5)
WBC: 10.5 10*3/uL (ref 4.0–10.5)

## 2017-09-05 LAB — SURGICAL PCR SCREEN
MRSA, PCR: NEGATIVE
Staphylococcus aureus: NEGATIVE

## 2017-09-05 LAB — BASIC METABOLIC PANEL
ANION GAP: 10 (ref 5–15)
BUN: 20 mg/dL (ref 6–20)
CALCIUM: 9.1 mg/dL (ref 8.9–10.3)
CO2: 25 mmol/L (ref 22–32)
CREATININE: 0.79 mg/dL (ref 0.44–1.00)
Chloride: 104 mmol/L (ref 101–111)
GFR calc Af Amer: >60 mL/min (ref >60)
GFR calc non Af Amer: >60 mL/min (ref >60)
GLUCOSE: 118 mg/dL — AB (ref 65–99)
Potassium: 3.4 mmol/L — ABNORMAL LOW (ref 3.5–5.1)
Sodium: 139 mmol/L (ref 135–145)

## 2017-09-05 LAB — TYPE AND SCREEN
ABO/RH(D): A POS
Antibody Screen: NEGATIVE

## 2017-09-05 LAB — GLUCOSE, CAPILLARY: Glucose-Capillary: 138 mg/dL — ABNORMAL HIGH (ref 65–99)

## 2017-09-05 LAB — PROTIME-INR
INR: 1.02
Prothrombin Time: 13.3 seconds (ref 11.4–15.2)

## 2017-09-05 LAB — URINALYSIS, ROUTINE W REFLEX MICROSCOPIC
Bilirubin Urine: NEGATIVE
GLUCOSE, UA: NEGATIVE mg/dL
HGB URINE DIPSTICK: NEGATIVE
KETONES UR: NEGATIVE mg/dL
NITRITE: NEGATIVE
PROTEIN: NEGATIVE mg/dL
SQUAMOUS EPITHELIAL / LPF: NONE SEEN
Specific Gravity, Urine: 1.016 (ref 1.005–1.030)
pH: 6 (ref 5.0–8.0)

## 2017-09-05 LAB — ABO/RH: ABO/RH(D): A POS

## 2017-09-05 LAB — APTT: aPTT: 31 seconds (ref 24–36)

## 2017-09-05 NOTE — Pre-Procedure Instructions (Signed)
Sophia Suarez  09/05/2017      [id number] [place], [place] - [address] AT [address] OF [facility] [address] [place] [address] Phone: [phone] Fax: [phone]    Your procedure is scheduled on 09-15-2017 Friday .  Report to [facility] Admitting at* 5:30 A.M.   Call this number if you have problems the morning of surgery:  [phone]   Remember:  Do not eat food or drink liquids after midnight.   Take these medicines the morning of surgery with A SIP OF WATER  Tylenol if needed Alphagan eye drops Trusopt eye drops Omeprazole(Prilosec) Sodium chloride nasal spray if needed Timolol(Betimol)   FOLLOW MD INSTRUCTIONS ON PLAVIX AND ASPIRIN  STOP TAKING ANY ASPIRIN(UNLESS OTHERWISE INSTRUCTED BY YOUR SURGEON),ANTIINFLAMATORIES (IBUPROFEN,ALEVE,MOTRIN,ADVIL,GOODY'S POWDERS),HERBAL SUPPLEMENTS,FISH OIL,AND VITAMINS 5-7 DAYS PRIOR TO SURGERY      Do not wear jewelry, make-up or nail polish.  Do not wear lotions, powders, or perfumes, or deodorant.  Do not shave 48 hours prior to surgery.  Men may shave face and neck.   Do not bring valuables to the hospital.   [facility] is not responsible for any belongings or valuables.  Contacts, dentures or bridgework may not be worn into surgery.  Leave your suitcase in the car.  After surgery it may be brought to your room.  For patients admitted to the hospital, discharge time will be determined by your treatment team.  Patients discharged the day of surgery will not be allowed to drive home.    Special Instructions: [place] - Preparing for Surgery  Before surgery, you can play an important role.  Because skin is not sterile, your skin needs to be as free of germs as possible.  You can reduce the number of germs on you skin by washing with CHG (chlorahexidine gluconate) soap before surgery.  CHG is an antiseptic cleaner which kills germs and bonds with the skin  to continue killing germs even after washing.  Please DO NOT use if you have an allergy to CHG or antibacterial soaps.  If your skin becomes reddened/irritated stop using the CHG and inform your nurse when you arrive at Short Stay.  Do not shave (including legs and underarms) for at least 48 hours prior to the first CHG shower.  You may shave your face.  Please follow these instructions carefully:   1.  Shower with CHG Soap the night before surgery and the   morning of Surgery.  2.  If you choose to wash your hair, wash your hair first as usual with your normal shampoo.  3.  After you shampoo, rinse your hair and body thoroughly to remove the  Shampoo.  4.  Use CHG as you would any other liquid soap.  You can apply chg directly  to the skin and wash gently with scrungie or a clean washcloth.  5.  Apply the CHG Soap to your body ONLY FROM THE NECK DOWN.   Do not use on open wounds or open sores.  Avoid contact with your eyes,  ears, mouth and genitals (private parts).  Wash genitals (private parts) with your normal soap.  6.  Wash thoroughly, paying special attention to the area where your surgery will be performed.  7.  Thoroughly rinse your body with warm water from the neck down.  8.  DO NOT shower/wash with your normal soap after using and rinsing o  the CHG Soap.  9.  Dennie Bible  yourself dry with a clean towel.            10.  Wear clean pajamas.            11.  Place clean sheets on your bed the night of your first shower and do not sleep with pets.  Day of Surgery  Do not apply any lotions/deodorants the morning of surgery.  Please wear clean clothes to the hospital/surgery center.   Please read over the following fact sheets that you were given. Pain Booklet, MRSA Information and Surgical Site Infection Prevention Incentive Spirmetry

## 2017-09-05 NOTE — Progress Notes (Signed)
Message sent to Dr. Turner Danielsowan regarding abnormal urine.

## 2017-09-12 DIAGNOSIS — M1712 Unilateral primary osteoarthritis, left knee: Secondary | ICD-10-CM | POA: Diagnosis present

## 2017-09-12 NOTE — H&P (Signed)
TOTAL KNEE ADMISSION H&P  Patient is being admitted for left total knee arthroplasty.  Subjective:  Chief Complaint:left knee pain.  HPI: Sophia Suarez, 77 y.o. female, has a history of pain and functional disability in the left knee due to arthritis and has failed non-surgical conservative treatments for greater than 12 weeks to includeNSAID's and/or analgesics, corticosteriod injections, weight reduction as appropriate and activity modification.  Onset of symptoms was gradual, starting 2 years ago with gradually worsening course since that time. The patient noted prior procedures on the knee to include  arthroscopy on the left knee(s).  Patient currently rates pain in the left knee(s) at 10 out of 10 with activity. Patient has night pain, worsening of pain with activity and weight bearing, pain that interferes with activities of daily living, pain with passive range of motion, crepitus and joint swelling.  Patient has evidence of joint space narrowing by imaging studies.   There is no active infection.  Patient Active Problem List   Diagnosis Date Noted  . Preop cardiovascular exam 07/23/2013  . Weight loss, non-intentional 07/03/2013  . Aortic stenosis 05/21/2013  . Occlusion and stenosis of carotid artery without mention of cerebral infarction 06/15/2012  . Hypertension   . Hyperlipidemia   . GERD (gastroesophageal reflux disease)   . Diverticulitis   . Chronic pain   . Lumbago   . Heart murmur   . Panic attacks   . Stroke (HCC)   . Allergic rhinitis, seasonal   . Carotid artery stenosis, right 11/18/2011   Past Medical History:  Diagnosis Date  . Allergic rhinitis, seasonal   . Anemia   . Arthritis   . Asthma   . Carotid artery occlusion   . Chronic kidney disease   . Chronic pain   . Complication of anesthesia    makes her hyper  . Diabetes mellitus    no medication  diet controlled  . Diverticulitis   . Dyspnea    with exertion  . GERD (gastroesophageal reflux  disease)   . Heart murmur   . Hyperlipidemia   . Hypertension   . Lumbago   . Panic attacks   . Stroke (HCC)   . Urinary incontinence     Past Surgical History:  Procedure Laterality Date  . ABDOMINAL HYSTERECTOMY  1973   Partial  . APPENDECTOMY  1958  . BIOPSY THYROID  Dec 05, 2013   Tumor- Benign  . COLECTOMY  2003   extent uncertain  . EYE SURGERY Right October 10, 2014   Cataract  . EYE SURGERY Left  October 17, 2014   Cataract  . KNEE ARTHROSCOPY Left   . OVARIAN CYST SURGERY    . PARTIAL HYSTERECTOMY      No current facility-administered medications for this encounter.    Current Outpatient Medications  Medication Sig Dispense Refill Last Dose  . acetaminophen (TYLENOL 8 HOUR ARTHRITIS PAIN) 650 MG CR tablet Take 1,300 mg by mouth every 8 (eight) hours as needed for pain.     Marland Kitchen alendronate (FOSAMAX) 70 MG tablet Take 70 mg by mouth every Tuesday. Take with a full glass of water on an empty stomach.    Taking  . aspirin EC 81 MG tablet Take 81 mg by mouth at bedtime.     . Biotin 1000 MCG tablet Take 2,000 mcg by mouth daily.     . brimonidine (ALPHAGAN) 0.2 % ophthalmic solution Place 1 drop into both eyes 2 (two) times daily.   Not  Taking  . Calcium Carb-Cholecalciferol (CALCIUM PLUS VITAMIN D3 PO) Take 1 tablet by mouth daily.     . cetirizine (ZYRTEC ALLERGY) 10 MG tablet Take 10 mg by mouth at bedtime.     . clopidogrel (PLAVIX) 75 MG tablet TAKE 1 TABLET (75 MG TOTAL) BY MOUTH DAILY. 30 tablet 0 Taking  . Coenzyme Q10-Vitamin E (QUNOL ULTRA COQ10 PO) Take 1 capsule by mouth daily.     Marland Kitchen. docusate sodium (COLACE) 100 MG capsule Take 100 mg by mouth at bedtime as needed for mild constipation.     . dorzolamide (TRUSOPT) 2 % ophthalmic solution Place 1 drop into both eyes 2 (two) times daily.    Taking  . Glucos-Chond-Hyal Ac-Ca Fructo (MOVE FREE JOINT HEALTH ADVANCE) TABS Take 2 tablets by mouth daily.     . hydrochlorothiazide (MICROZIDE) 12.5 MG capsule Take 1 capsule  (12.5 mg total) by mouth daily. 90 capsule 3 Taking  . losartan (COZAAR) 100 MG tablet Take 100 mg by mouth daily.    Taking  . Multiple Vitamin (MULTIVITAMIN WITH MINERALS) TABS tablet Take 1 tablet by mouth daily. Centrum Silver     . Omega-3 Fatty Acids (FISH OIL) 1000 MG CAPS Take 2,000 mg by mouth daily.     Marland Kitchen. omeprazole (PRILOSEC) 20 MG capsule Take 20 mg by mouth 2 (two) times daily.    Taking  . Probiotic Product (PROBIOTIC DAILY PO) Take 1 capsule by mouth daily.   Taking  . simvastatin (ZOCOR) 40 MG tablet Take 40 mg by mouth every evening.   Taking  . sodium chloride (OCEAN) 0.65 % SOLN nasal spray Place 1 spray into both nostrils 4 (four) times daily as needed for congestion.     . timolol (BETIMOL) 0.5 % ophthalmic solution Place 1 drop into both eyes 2 (two) times daily.     Marland Kitchen. triamcinolone cream (KENALOG) 0.1 % Apply 1 application topically 2 (two) times daily as needed (for skin irriation/ rash/lichenoid dermatitis).     . vitamin B-12 (CYANOCOBALAMIN) 1000 MCG tablet Take 1,000 mcg by mouth daily.     . diphenhydrAMINE (BENADRYL) 25 mg capsule Take 25 mg by mouth daily.    Taking   Allergies  Allergen Reactions  . Clindamycin/Lincomycin Anaphylaxis  . Septra [Sulfamethoxazole-Trimethoprim] Anaphylaxis  . Sulfa Antibiotics Anaphylaxis  . Melatonin Other (See Comments) and Hypertension    Over active   . Horse-Derived Products Itching  . Latex Hives  . Mold Extract [Trichophyton] Other (See Comments)    Congestion.  . Pollen Extract Other (See Comments)    Congestion.    Social History   Tobacco Use  . Smoking status: Never Smoker  . Smokeless tobacco: Never Used  Substance Use Topics  . Alcohol use: Yes    Alcohol/week: 0.0 - 0.5 oz    Comment: occasionally; 1 drink monthly    Family History  Problem Relation Age of Onset  . Aneurysm Mother        aortic  . Coronary artery disease Mother        CABG  . Cancer Mother   . Diabetes Mother        Amputation   . Heart disease Mother        AAA  . Hypertension Mother   . Hyperlipidemia Mother   . Cancer Father        Brain  . Hypertension Father   . Deep vein thrombosis Father   . Heart disease Father   . Hyperlipidemia  Father   . Hypertension Brother   . Hypertension Son      Review of Systems  Constitutional: Positive for malaise/fatigue.  HENT: Positive for hearing loss and sinus pain.   Eyes: Negative.   Respiratory: Positive for shortness of breath.   Cardiovascular: Positive for leg swelling.       Heart murmur  Gastrointestinal: Positive for abdominal pain, constipation, diarrhea and nausea.  Genitourinary: Positive for frequency and urgency.  Musculoskeletal: Positive for joint pain.  Skin: Negative.   Neurological: Positive for dizziness.  Endo/Heme/Allergies: Positive for polydipsia.  Psychiatric/Behavioral: Positive for memory loss.    Objective:  Physical Exam  Constitutional: She is oriented to person, place, and time. She appears well-developed and well-nourished.  HENT:  Head: Normocephalic and atraumatic.  Eyes: Pupils are equal, round, and reactive to light.  Neck: Normal range of motion. Neck supple.  Cardiovascular: Intact distal pulses.  Respiratory: Effort normal.  Musculoskeletal: She exhibits tenderness.  Patient has obvious valgus deformity to the left knee, tender along the lateral joint line.  Range of motion is 0/135 and there is palpable grinding and crepitus laterally She is neurovascular intact distally.  Toes are pink and well-perfused.  Skin is intact.  Neurological: She is alert and oriented to person, place, and time.  Skin: Skin is warm and dry.  Psychiatric: She has a normal mood and affect. Her behavior is normal. Judgment and thought content normal.    Vital signs in last 24 hours:    Labs:   Estimated body mass index is 32.62 kg/m as calculated from the following:   Height as of 09/05/17: 4' 10.5" (1.486 m).   Weight as of  09/05/17: 72 kg (158 lb 12.8 oz).   Imaging Review Plain radiographs demonstrate AP, lateral, and sunrise, she does have some arthritic changes noted in her left knee, particularly with bony osteophyte noted in the lateral tibial plateau, no bone-on-bone changes seen, there is some patellofemoral osteoarthritis seen with bony spurring in the posterior patella.   Assessment/Plan:  End stage arthritis, left knee   The patient history, physical examination, clinical judgment of the provider and imaging studies are consistent with end stage degenerative joint disease of the left knee(s) and total knee arthroplasty is deemed medically necessary. The treatment options including medical management, injection therapy arthroscopy and arthroplasty were discussed at length. The risks and benefits of total knee arthroplasty were presented and reviewed. The risks due to aseptic loosening, infection, stiffness, patella tracking problems, thromboembolic complications and other imponderables were discussed. The patient acknowledged the explanation, agreed to proceed with the plan and consent was signed. Patient is being admitted for inpatient treatment for surgery, pain control, PT, OT, prophylactic antibiotics, VTE prophylaxis, progressive ambulation and ADL's and discharge planning. Anticipated LOS equal to or greater than 2 midnights due to - Age 13 and older with one or more of the following:  - Obesity  - ASA Class 3 and higher  - Expected need for hospital services (PT, OT, Nursing) required for safe discharge  - Active co-morbidities: Chronic pain requiring opiods, Coronary Artery Disease, Stroke and Cardiac ArrhythmiaThe patient is planning to be discharged home with home health services.

## 2017-09-13 ENCOUNTER — Other Ambulatory Visit: Payer: Self-pay | Admitting: Orthopedic Surgery

## 2017-09-14 MED ORDER — TRANEXAMIC ACID 1000 MG/10ML IV SOLN
1000.0000 mg | INTRAVENOUS | Status: DC
  Filled 2017-09-14: qty 10

## 2017-09-14 MED ORDER — BUPIVACAINE LIPOSOME 1.3 % IJ SUSP
20.0000 mL | INTRAMUSCULAR | Status: AC
  Administered 2017-09-15: 20 mL
  Filled 2017-09-14: qty 20

## 2017-09-14 NOTE — Anesthesia Preprocedure Evaluation (Addendum)
Anesthesia Evaluation  Patient identified by MRN, date of birth, ID band Patient awake    Reviewed: Allergy & Precautions, NPO status , Patient's Chart, lab work & pertinent test results  History of Anesthesia Complications Negative for: history of anesthetic complications  Airway Mallampati: II  TM Distance: >3 FB Neck ROM: Full    Dental  (+) Teeth Intact, Dental Advisory Given   Pulmonary asthma ,    Pulmonary exam normal        Cardiovascular hypertension, Normal cardiovascular exam  Study Conclusions  - Left ventricle: The cavity size was normal. There was moderate   concentric hypertrophy. Systolic function was vigorous. The   estimated ejection fraction was in the range of 65% to 70%. Wall   motion was normal; there were no regional wall motion   abnormalities. There was an increased relative contribution of   atrial contraction to ventricular filling. Doppler parameters are   consistent with abnormal left ventricular relaxation (grade 1   diastolic dysfunction). - Aortic valve: Moderately calcified annulus. Trileaflet; mildly   thickened, mildly calcified leaflets. There was very mild   stenosis.   Neuro/Psych Anxiety CVA    GI/Hepatic Neg liver ROS, GERD  ,  Endo/Other  diabetes  Renal/GU negative Renal ROS     Musculoskeletal  (+) Arthritis ,   Abdominal   Peds  Hematology   Anesthesia Other Findings   Reproductive/Obstetrics                           Anesthesia Physical Anesthesia Plan  ASA: III  Anesthesia Plan: Spinal and MAC   Post-op Pain Management:  Regional for Post-op pain   Induction:   PONV Risk Score and Plan: 2 and Ondansetron and Propofol infusion  Airway Management Planned: Natural Airway  Additional Equipment:   Intra-op Plan:   Post-operative Plan:   Informed Consent: I have reviewed the patients History and Physical, chart, labs and  discussed the procedure including the risks, benefits and alternatives for the proposed anesthesia with the patient or authorized representative who has indicated his/her understanding and acceptance.   Dental advisory given  Plan Discussed with: Anesthesiologist and CRNA  Anesthesia Plan Comments:        Anesthesia Quick Evaluation

## 2017-09-15 ENCOUNTER — Other Ambulatory Visit: Payer: Self-pay

## 2017-09-15 ENCOUNTER — Ambulatory Visit (HOSPITAL_COMMUNITY): Payer: Medicare Other | Admitting: Anesthesiology

## 2017-09-15 ENCOUNTER — Encounter (HOSPITAL_COMMUNITY)
Admission: AD | Disposition: A | Discharge status: Home Health | Payer: Self-pay | Source: Ambulatory Visit | Attending: Orthopedic Surgery

## 2017-09-15 ENCOUNTER — Encounter (HOSPITAL_COMMUNITY): Payer: Self-pay | Admitting: *Deleted

## 2017-09-15 ENCOUNTER — Inpatient Hospital Stay (HOSPITAL_COMMUNITY)
Admission: AD | Admit: 2017-09-15 | Discharge: 2017-09-17 | DRG: 470 | Disposition: A | Discharge status: Home Health | Payer: Medicare Other | Source: Ambulatory Visit | Attending: Orthopedic Surgery | Admitting: Orthopedic Surgery

## 2017-09-15 DIAGNOSIS — Z6832 Body mass index (BMI) 32.0-32.9, adult: Secondary | ICD-10-CM

## 2017-09-15 DIAGNOSIS — J302 Other seasonal allergic rhinitis: Secondary | ICD-10-CM | POA: Diagnosis present

## 2017-09-15 DIAGNOSIS — Z888 Allergy status to other drugs, medicaments and biological substances status: Secondary | ICD-10-CM | POA: Diagnosis not present

## 2017-09-15 DIAGNOSIS — Z91040 Latex allergy status: Secondary | ICD-10-CM | POA: Diagnosis not present

## 2017-09-15 DIAGNOSIS — Z881 Allergy status to other antibiotic agents status: Secondary | ICD-10-CM | POA: Diagnosis not present

## 2017-09-15 DIAGNOSIS — I2510 Atherosclerotic heart disease of native coronary artery without angina pectoris: Secondary | ICD-10-CM | POA: Diagnosis present

## 2017-09-15 DIAGNOSIS — Z882 Allergy status to sulfonamides status: Secondary | ICD-10-CM | POA: Diagnosis not present

## 2017-09-15 DIAGNOSIS — Z8673 Personal history of transient ischemic attack (TIA), and cerebral infarction without residual deficits: Secondary | ICD-10-CM | POA: Diagnosis not present

## 2017-09-15 DIAGNOSIS — E785 Hyperlipidemia, unspecified: Secondary | ICD-10-CM | POA: Diagnosis present

## 2017-09-15 DIAGNOSIS — Z91048 Other nonmedicinal substance allergy status: Secondary | ICD-10-CM

## 2017-09-15 DIAGNOSIS — I10 Essential (primary) hypertension: Secondary | ICD-10-CM | POA: Diagnosis present

## 2017-09-15 DIAGNOSIS — Z79899 Other long term (current) drug therapy: Secondary | ICD-10-CM

## 2017-09-15 DIAGNOSIS — F419 Anxiety disorder, unspecified: Secondary | ICD-10-CM | POA: Diagnosis present

## 2017-09-15 DIAGNOSIS — I350 Nonrheumatic aortic (valve) stenosis: Secondary | ICD-10-CM | POA: Diagnosis present

## 2017-09-15 DIAGNOSIS — Z7982 Long term (current) use of aspirin: Secondary | ICD-10-CM | POA: Diagnosis not present

## 2017-09-15 DIAGNOSIS — I6521 Occlusion and stenosis of right carotid artery: Secondary | ICD-10-CM | POA: Diagnosis present

## 2017-09-15 DIAGNOSIS — Z7983 Long term (current) use of bisphosphonates: Secondary | ICD-10-CM | POA: Diagnosis not present

## 2017-09-15 DIAGNOSIS — Z7902 Long term (current) use of antithrombotics/antiplatelets: Secondary | ICD-10-CM

## 2017-09-15 DIAGNOSIS — E669 Obesity, unspecified: Secondary | ICD-10-CM | POA: Diagnosis present

## 2017-09-15 DIAGNOSIS — G8929 Other chronic pain: Secondary | ICD-10-CM | POA: Diagnosis present

## 2017-09-15 DIAGNOSIS — K219 Gastro-esophageal reflux disease without esophagitis: Secondary | ICD-10-CM | POA: Diagnosis present

## 2017-09-15 DIAGNOSIS — M1712 Unilateral primary osteoarthritis, left knee: Secondary | ICD-10-CM | POA: Diagnosis present

## 2017-09-15 HISTORY — PX: TOTAL KNEE ARTHROPLASTY: SHX125

## 2017-09-15 LAB — GLUCOSE, CAPILLARY
GLUCOSE-CAPILLARY: 113 mg/dL — AB (ref 65–99)
GLUCOSE-CAPILLARY: 91 mg/dL (ref 65–99)

## 2017-09-15 SURGERY — ARTHROPLASTY, KNEE, TOTAL
Anesthesia: Monitor Anesthesia Care | Site: Knee | Laterality: Left

## 2017-09-15 MED ORDER — PHENOL 1.4 % MT LIQD
1.0000 | OROMUCOSAL | Status: DC | PRN
Start: 2017-09-15 — End: 2017-09-17

## 2017-09-15 MED ORDER — CHLORHEXIDINE GLUCONATE 4 % EX LIQD: 60.0000 mL | Freq: Once | CUTANEOUS | Status: DC

## 2017-09-15 MED ORDER — SODIUM CHLORIDE 0.9 % IJ SOLN
INTRAMUSCULAR | Status: DC | PRN
  Administered 2017-09-15: 50 mL

## 2017-09-15 MED ORDER — BUPIVACAINE-EPINEPHRINE (PF) 0.5% -1:200000 IJ SOLN
INTRAMUSCULAR | Status: AC
  Filled 2017-09-15: qty 60

## 2017-09-15 MED ORDER — PROMETHAZINE HCL 25 MG/ML IJ SOLN: 6.2500 mg | INTRAMUSCULAR | Status: DC | PRN

## 2017-09-15 MED ORDER — TIMOLOL HEMIHYDRATE 0.5 % OP SOLN: 1.0000 [drp] | Freq: Two times a day (BID) | OPHTHALMIC | Status: DC

## 2017-09-15 MED ORDER — ACETAMINOPHEN 325 MG PO TABS: 325.0000 mg | ORAL_TABLET | Freq: Four times a day (QID) | ORAL | Status: DC | PRN

## 2017-09-15 MED ORDER — 0.9 % SODIUM CHLORIDE (POUR BTL) OPTIME
TOPICAL | Status: DC | PRN
  Administered 2017-09-15: 1000 mL

## 2017-09-15 MED ORDER — OXYCODONE-ACETAMINOPHEN 5-325 MG PO TABS: 1.0000 | ORAL_TABLET | ORAL | 0 refills | Status: DC | PRN

## 2017-09-15 MED ORDER — ALUM & MAG HYDROXIDE-SIMETH 200-200-20 MG/5ML PO SUSP: 30.0000 mL | ORAL | Status: DC | PRN

## 2017-09-15 MED ORDER — SENNOSIDES-DOCUSATE SODIUM 8.6-50 MG PO TABS: 1.0000 | ORAL_TABLET | Freq: Every evening | ORAL | Status: DC | PRN

## 2017-09-15 MED ORDER — TIMOLOL MALEATE 0.5 % OP SOLN
1.0000 [drp] | Freq: Two times a day (BID) | OPHTHALMIC | Status: DC
  Administered 2017-09-15 – 2017-09-17 (×4): 1 [drp] via OPHTHALMIC
  Filled 2017-09-15: qty 5

## 2017-09-15 MED ORDER — TIZANIDINE HCL 2 MG PO TABS: 2.0000 mg | ORAL_TABLET | Freq: Four times a day (QID) | ORAL | 0 refills | Status: DC | PRN

## 2017-09-15 MED ORDER — TRANEXAMIC ACID 1000 MG/10ML IV SOLN
2000.0000 mg | Freq: Once | INTRAVENOUS | Status: DC
  Filled 2017-09-15: qty 20

## 2017-09-15 MED ORDER — BUPIVACAINE IN DEXTROSE 0.75-8.25 % IT SOLN
INTRATHECAL | Status: DC | PRN
  Administered 2017-09-15: 12 mg via INTRATHECAL

## 2017-09-15 MED ORDER — METHOCARBAMOL 500 MG PO TABS
500.0000 mg | ORAL_TABLET | Freq: Four times a day (QID) | ORAL | Status: DC | PRN
  Administered 2017-09-15 – 2017-09-16 (×3): 500 mg via ORAL
  Filled 2017-09-15 (×3): qty 1

## 2017-09-15 MED ORDER — CEFAZOLIN SODIUM-DEXTROSE 2-4 GM/100ML-% IV SOLN
2.0000 g | INTRAVENOUS | Status: AC
  Administered 2017-09-15: 2 g via INTRAVENOUS
  Filled 2017-09-15: qty 100

## 2017-09-15 MED ORDER — FENTANYL CITRATE (PF) 100 MCG/2ML IJ SOLN
INTRAMUSCULAR | Status: DC | PRN
  Administered 2017-09-15: 25 ug via INTRAVENOUS
  Administered 2017-09-15: 50 ug via INTRAVENOUS

## 2017-09-15 MED ORDER — HYDROCHLOROTHIAZIDE 12.5 MG PO CAPS
12.5000 mg | ORAL_CAPSULE | Freq: Every day | ORAL | Status: DC
  Administered 2017-09-15 – 2017-09-17 (×3): 12.5 mg via ORAL
  Filled 2017-09-15 (×3): qty 1

## 2017-09-15 MED ORDER — DOCUSATE SODIUM 100 MG PO CAPS
100.0000 mg | ORAL_CAPSULE | Freq: Two times a day (BID) | ORAL | Status: DC
  Administered 2017-09-15 – 2017-09-17 (×5): 100 mg via ORAL
  Filled 2017-09-15 (×5): qty 1

## 2017-09-15 MED ORDER — CLOPIDOGREL BISULFATE 75 MG PO TABS
300.0000 mg | ORAL_TABLET | Freq: Once | ORAL | Status: AC
  Administered 2017-09-15: 300 mg via ORAL
  Filled 2017-09-15: qty 4

## 2017-09-15 MED ORDER — LOSARTAN POTASSIUM 50 MG PO TABS
100.0000 mg | ORAL_TABLET | Freq: Every day | ORAL | Status: DC
  Administered 2017-09-16 – 2017-09-17 (×2): 100 mg via ORAL
  Filled 2017-09-15 (×2): qty 2

## 2017-09-15 MED ORDER — LIDOCAINE HCL (CARDIAC) 20 MG/ML IV SOLN
INTRAVENOUS | Status: DC | PRN
  Administered 2017-09-15: 100 mg via INTRAVENOUS

## 2017-09-15 MED ORDER — DIPHENHYDRAMINE HCL 12.5 MG/5ML PO ELIX: 12.5000 mg | ORAL_SOLUTION | ORAL | Status: DC | PRN

## 2017-09-15 MED ORDER — BUPIVACAINE-EPINEPHRINE (PF) 0.25% -1:200000 IJ SOLN
INTRAMUSCULAR | Status: DC | PRN
  Administered 2017-09-15: 50 mL via PERINEURAL

## 2017-09-15 MED ORDER — ONDANSETRON HCL 4 MG/2ML IJ SOLN
4.0000 mg | Freq: Four times a day (QID) | INTRAMUSCULAR | Status: DC | PRN
  Administered 2017-09-15: 4 mg via INTRAVENOUS
  Filled 2017-09-15: qty 2

## 2017-09-15 MED ORDER — METHOCARBAMOL 1000 MG/10ML IJ SOLN: 500.0000 mg | Freq: Four times a day (QID) | INTRAVENOUS | Status: DC | PRN

## 2017-09-15 MED ORDER — ASPIRIN EC 81 MG PO TBEC
81.0000 mg | DELAYED_RELEASE_TABLET | Freq: Every day | ORAL | Status: DC
  Administered 2017-09-16 – 2017-09-17 (×2): 81 mg via ORAL
  Filled 2017-09-15 (×2): qty 1

## 2017-09-15 MED ORDER — LORATADINE 10 MG PO TABS
10.0000 mg | ORAL_TABLET | Freq: Every day | ORAL | Status: DC
  Administered 2017-09-15 – 2017-09-17 (×3): 10 mg via ORAL
  Filled 2017-09-15 (×3): qty 1

## 2017-09-15 MED ORDER — BRIMONIDINE TARTRATE 0.2 % OP SOLN
1.0000 [drp] | Freq: Two times a day (BID) | OPHTHALMIC | Status: DC
  Filled 2017-09-15: qty 5

## 2017-09-15 MED ORDER — PHENYLEPHRINE 40 MCG/ML (10ML) SYRINGE FOR IV PUSH (FOR BLOOD PRESSURE SUPPORT)
PREFILLED_SYRINGE | INTRAVENOUS | Status: AC
  Filled 2017-09-15: qty 10

## 2017-09-15 MED ORDER — PROPOFOL 10 MG/ML IV BOLUS
INTRAVENOUS | Status: AC
  Filled 2017-09-15: qty 60

## 2017-09-15 MED ORDER — ROPIVACAINE HCL 5 MG/ML IJ SOLN
INTRAMUSCULAR | Status: DC | PRN
  Administered 2017-09-15: 150 mg via PERINEURAL

## 2017-09-15 MED ORDER — PROPOFOL 500 MG/50ML IV EMUL
INTRAVENOUS | Status: DC | PRN
  Administered 2017-09-15: 100 ug/kg/min via INTRAVENOUS

## 2017-09-15 MED ORDER — TRANEXAMIC ACID 1000 MG/10ML IV SOLN
INTRAVENOUS | Status: AC | PRN
  Administered 2017-09-15: 2000 mg via TOPICAL

## 2017-09-15 MED ORDER — ONDANSETRON HCL 4 MG/2ML IJ SOLN
INTRAMUSCULAR | Status: DC | PRN
  Administered 2017-09-15: 4 mg via INTRAVENOUS

## 2017-09-15 MED ORDER — HYDROMORPHONE HCL 1 MG/ML IJ SOLN
0.5000 mg | INTRAMUSCULAR | Status: DC | PRN
Start: 2017-09-15 — End: 2017-09-17

## 2017-09-15 MED ORDER — DEXAMETHASONE SODIUM PHOSPHATE 10 MG/ML IJ SOLN
10.0000 mg | Freq: Once | INTRAMUSCULAR | Status: AC
  Administered 2017-09-16: 10 mg via INTRAVENOUS
  Filled 2017-09-15: qty 1

## 2017-09-15 MED ORDER — GABAPENTIN 300 MG PO CAPS
300.0000 mg | ORAL_CAPSULE | Freq: Three times a day (TID) | ORAL | Status: DC
  Administered 2017-09-15 – 2017-09-17 (×6): 300 mg via ORAL
  Filled 2017-09-15 (×6): qty 1

## 2017-09-15 MED ORDER — KCL IN DEXTROSE-NACL 20-5-0.45 MEQ/L-%-% IV SOLN
INTRAVENOUS | Status: DC
  Administered 2017-09-15: 15:00:00 via INTRAVENOUS
  Filled 2017-09-15: qty 1000

## 2017-09-15 MED ORDER — MENTHOL 3 MG MT LOZG: 1.0000 | LOZENGE | OROMUCOSAL | Status: DC | PRN

## 2017-09-15 MED ORDER — FLEET ENEMA 7-19 GM/118ML RE ENEM: 1.0000 | ENEMA | Freq: Once | RECTAL | Status: DC | PRN

## 2017-09-15 MED ORDER — FENTANYL CITRATE (PF) 250 MCG/5ML IJ SOLN
INTRAMUSCULAR | Status: AC
  Filled 2017-09-15: qty 5

## 2017-09-15 MED ORDER — CLOPIDOGREL BISULFATE 75 MG PO TABS
75.0000 mg | ORAL_TABLET | Freq: Every day | ORAL | Status: DC
  Administered 2017-09-16 – 2017-09-17 (×2): 75 mg via ORAL
  Filled 2017-09-15 (×2): qty 1

## 2017-09-15 MED ORDER — GLYCOPYRROLATE 0.2 MG/ML IJ SOLN
INTRAMUSCULAR | Status: DC | PRN
  Administered 2017-09-15: 0.2 mg via INTRAVENOUS

## 2017-09-15 MED ORDER — BISACODYL 5 MG PO TBEC
5.0000 mg | DELAYED_RELEASE_TABLET | Freq: Every day | ORAL | Status: DC | PRN
Start: 2017-09-15 — End: 2017-09-17

## 2017-09-15 MED ORDER — PROPOFOL 10 MG/ML IV BOLUS
INTRAVENOUS | Status: DC | PRN
  Administered 2017-09-15: 30 mg via INTRAVENOUS

## 2017-09-15 MED ORDER — ONDANSETRON HCL 4 MG/2ML IJ SOLN
INTRAMUSCULAR | Status: AC
Start: 2017-09-15 — End: 2017-09-15
  Filled 2017-09-15: qty 12

## 2017-09-15 MED ORDER — ONDANSETRON HCL 4 MG PO TABS: 4.0000 mg | ORAL_TABLET | Freq: Four times a day (QID) | ORAL | Status: DC | PRN

## 2017-09-15 MED ORDER — MIDAZOLAM HCL 2 MG/2ML IJ SOLN
INTRAMUSCULAR | Status: DC | PRN
  Administered 2017-09-15 (×2): 1 mg via INTRAVENOUS

## 2017-09-15 MED ORDER — DORZOLAMIDE HCL 2 % OP SOLN
1.0000 [drp] | Freq: Two times a day (BID) | OPHTHALMIC | Status: DC
  Administered 2017-09-15 – 2017-09-17 (×4): 1 [drp] via OPHTHALMIC
  Filled 2017-09-15: qty 10

## 2017-09-15 MED ORDER — TRANEXAMIC ACID 1000 MG/10ML IV SOLN
1000.0000 mg | Freq: Once | INTRAVENOUS | Status: AC
  Administered 2017-09-15: 1000 mg via INTRAVENOUS
  Filled 2017-09-15: qty 10

## 2017-09-15 MED ORDER — FENTANYL CITRATE (PF) 100 MCG/2ML IJ SOLN: 25.0000 ug | INTRAMUSCULAR | Status: DC | PRN

## 2017-09-15 MED ORDER — SODIUM CHLORIDE 0.9 % IR SOLN
Status: DC | PRN
  Administered 2017-09-15: 3000 mL

## 2017-09-15 MED ORDER — MIDAZOLAM HCL 2 MG/2ML IJ SOLN
INTRAMUSCULAR | Status: AC
  Filled 2017-09-15: qty 2

## 2017-09-15 MED ORDER — PHENYLEPHRINE HCL 10 MG/ML IJ SOLN
INTRAMUSCULAR | Status: DC | PRN
  Administered 2017-09-15: 80 ug via INTRAVENOUS

## 2017-09-15 MED ORDER — METOCLOPRAMIDE HCL 5 MG PO TABS: 5.0000 mg | ORAL_TABLET | Freq: Three times a day (TID) | ORAL | Status: DC | PRN

## 2017-09-15 MED ORDER — BIOTIN 1000 MCG PO TABS: 2000.0000 ug | ORAL_TABLET | Freq: Every day | ORAL | Status: DC

## 2017-09-15 MED ORDER — PANTOPRAZOLE SODIUM 40 MG PO TBEC
40.0000 mg | DELAYED_RELEASE_TABLET | Freq: Every day | ORAL | Status: DC
  Administered 2017-09-16 – 2017-09-17 (×2): 40 mg via ORAL
  Filled 2017-09-15 (×2): qty 1

## 2017-09-15 MED ORDER — METOCLOPRAMIDE HCL 5 MG/ML IJ SOLN: 5.0000 mg | Freq: Three times a day (TID) | INTRAMUSCULAR | Status: DC | PRN

## 2017-09-15 MED ORDER — OXYCODONE HCL 5 MG PO TABS
5.0000 mg | ORAL_TABLET | ORAL | Status: DC | PRN
  Administered 2017-09-15: 10 mg via ORAL
  Administered 2017-09-15: 5 mg via ORAL
  Administered 2017-09-16: 10 mg via ORAL
  Administered 2017-09-16: 5 mg via ORAL
  Administered 2017-09-16: 10 mg via ORAL
  Filled 2017-09-15: qty 1
  Filled 2017-09-15 (×5): qty 2

## 2017-09-15 MED ORDER — LACTATED RINGERS IV SOLN
INTRAVENOUS | Status: DC
  Administered 2017-09-15: 07:00:00 via INTRAVENOUS

## 2017-09-15 SURGICAL SUPPLY — 56 items
BAG DECANTER FOR FLEXI CONT (MISCELLANEOUS) ×3 IMPLANT
BANDAGE ESMARK 6X9 LF (GAUZE/BANDAGES/DRESSINGS) ×1 IMPLANT
BLADE SAG 18X100X1.27 (BLADE) ×3 IMPLANT
BLADE SAGITTAL 13X1.27X60 (BLADE) IMPLANT
BLADE SAGITTAL 13X1.27X60MM (BLADE)
BLADE SAW SGTL 13X75X1.27 (BLADE) ×3 IMPLANT
BNDG ELASTIC 6X10 VLCR STRL LF (GAUZE/BANDAGES/DRESSINGS) ×3 IMPLANT
BNDG ESMARK 6X9 LF (GAUZE/BANDAGES/DRESSINGS) ×3
BOWL SMART MIX CTS (DISPOSABLE) ×3 IMPLANT
CAPT KNEE TOTAL 3 ATTUNE ×3 IMPLANT
CEMENT HV SMART SET (Cement) ×6 IMPLANT
COVER SURGICAL LIGHT HANDLE (MISCELLANEOUS) ×3 IMPLANT
CUFF TOURNIQUET SINGLE 34IN LL (TOURNIQUET CUFF) ×3 IMPLANT
CUFF TOURNIQUET SINGLE 44IN (TOURNIQUET CUFF) IMPLANT
DRAPE EXTREMITY T 121X128X90 (DRAPE) ×3 IMPLANT
DRAPE U-SHAPE 47X51 STRL (DRAPES) ×3 IMPLANT
DRSG AQUACEL AG ADV 3.5X10 (GAUZE/BANDAGES/DRESSINGS) ×3 IMPLANT
DURAPREP 26ML APPLICATOR (WOUND CARE) ×6 IMPLANT
ELECT REM PT RETURN 9FT ADLT (ELECTROSURGICAL) ×3
ELECTRODE REM PT RTRN 9FT ADLT (ELECTROSURGICAL) ×1 IMPLANT
GLOVE BIO SURGEON STRL SZ7.5 (GLOVE) IMPLANT
GLOVE BIO SURGEON STRL SZ8.5 (GLOVE) IMPLANT
GLOVE BIOGEL PI IND STRL 8 (GLOVE) ×1 IMPLANT
GLOVE BIOGEL PI IND STRL 9 (GLOVE) ×1 IMPLANT
GLOVE BIOGEL PI INDICATOR 8 (GLOVE) ×2
GLOVE BIOGEL PI INDICATOR 9 (GLOVE) ×2
GLOVE SURG SS PI 7.5 STRL IVOR (GLOVE) ×3 IMPLANT
GLOVE SURG SS PI 8.5 STRL IVOR (GLOVE) ×2
GLOVE SURG SS PI 8.5 STRL STRW (GLOVE) ×1 IMPLANT
GOWN STRL REUS W/ TWL LRG LVL3 (GOWN DISPOSABLE) ×1 IMPLANT
GOWN STRL REUS W/ TWL XL LVL3 (GOWN DISPOSABLE) ×2 IMPLANT
GOWN STRL REUS W/TWL LRG LVL3 (GOWN DISPOSABLE) ×2
GOWN STRL REUS W/TWL XL LVL3 (GOWN DISPOSABLE) ×4
HANDPIECE INTERPULSE COAX TIP (DISPOSABLE) ×2
HOOD PEEL AWAY FACE SHEILD DIS (HOOD) ×6 IMPLANT
KIT BASIN OR (CUSTOM PROCEDURE TRAY) ×3 IMPLANT
KIT ROOM TURNOVER OR (KITS) ×3 IMPLANT
MANIFOLD NEPTUNE II (INSTRUMENTS) ×3 IMPLANT
NEEDLE 22X1 1/2 (OR ONLY) (NEEDLE) ×6 IMPLANT
NS IRRIG 1000ML POUR BTL (IV SOLUTION) ×3 IMPLANT
PACK TOTAL JOINT (CUSTOM PROCEDURE TRAY) ×3 IMPLANT
PAD ARMBOARD 7.5X6 YLW CONV (MISCELLANEOUS) ×6 IMPLANT
PIN STEINMAN FIXATION KNEE (PIN) IMPLANT
SET HNDPC FAN SPRY TIP SCT (DISPOSABLE) ×1 IMPLANT
SUT VIC AB 0 CT1 27 (SUTURE) ×2
SUT VIC AB 0 CT1 27XBRD ANBCTR (SUTURE) ×1 IMPLANT
SUT VIC AB 1 CTX 36 (SUTURE) ×2
SUT VIC AB 1 CTX36XBRD ANBCTR (SUTURE) ×1 IMPLANT
SUT VIC AB 2-0 CT1 27 (SUTURE) ×2
SUT VIC AB 2-0 CT1 TAPERPNT 27 (SUTURE) ×1 IMPLANT
SUT VIC AB 3-0 CT1 27 (SUTURE) ×2
SUT VIC AB 3-0 CT1 TAPERPNT 27 (SUTURE) ×1 IMPLANT
SYR CONTROL 10ML LL (SYRINGE) ×6 IMPLANT
TOWEL OR 17X24 6PK STRL BLUE (TOWEL DISPOSABLE) ×3 IMPLANT
TOWEL OR 17X26 10 PK STRL BLUE (TOWEL DISPOSABLE) ×3 IMPLANT
TRAY CATH 16FR W/PLASTIC CATH (SET/KITS/TRAYS/PACK) ×3 IMPLANT

## 2017-09-15 NOTE — Anesthesia Postprocedure Evaluation (Signed)
Anesthesia Post Note  Patient: Sophia Suarez  Procedure(s) Performed: TOTAL KNEE ARTHROPLASTY (Left Knee)     Patient location during evaluation: PACU Anesthesia Type: MAC and Spinal Level of consciousness: awake and alert Pain management: pain level controlled Vital Signs Assessment: post-procedure vital signs reviewed and stable Respiratory status: spontaneous breathing and respiratory function stable Cardiovascular status: stable Postop Assessment: no apparent nausea or vomiting and spinal receding Anesthetic complications: no    Last Vitals:  Vitals:   09/15/17 1030 09/15/17 1100  BP:    Pulse: (!) 56 (!) 55  Resp: 12 13  Temp:    SpO2: 100% 100%    Last Pain:  Vitals:   09/15/17 0544  TempSrc: Oral                 Zayley Arras DANIEL

## 2017-09-15 NOTE — Progress Notes (Signed)
Orthopedic Tech Progress Note Patient Details:  Sophia Suarez [birth year] [id number]  Ortho Devices Type of Ortho Device: Bone foam zero knee Ortho Device/Splint Location: lle Ortho Device/Splint Interventions: Application   Post Interventions Patient Tolerated: Well Instructions Provided: Care of device   Nikki DomCrawford, Sophia Suarez 09/15/2017, 12:53 PM

## 2017-09-15 NOTE — Op Note (Signed)
PATIENT ID:      Sophia Suarez  MRN:     [id number] DOB/AGE:    1941-03-20 / 77 y.o.       OPERATIVE REPORT    DATE OF PROCEDURE:  09/15/2017       PREOPERATIVE DIAGNOSIS:   LEFT KNEE OSTEOARTHRITIS      Estimated body mass index is 32.46 kg/m as calculated from the following:   Height as of 09/05/17: 4' 10.5" (1.486 m).   Weight as of this encounter: 158 lb (71.7 kg).                                                        POSTOPERATIVE DIAGNOSIS:   LEFT KNEE OSTEOARTHRITIS                                                                      PROCEDURE:  Procedure(s): TOTAL KNEE ARTHROPLASTY Using DepuyAttune RP implants #4L Femur, #4Tibia, 5 mm Attune RP bearing, 35 Patella     SURGEON: Nestor LewandowskyFrank J Bryson Gavia    ASSISTANT:   Tomi LikensEric K. [employer] Energy[employer]illips PA-C   (Present and scrubbed throughout the case, critical for assistance with exposure, retraction, instrumentation, and closure.)         ANESTHESIA: Spinal, 20cc Exparel, 50cc 0.25% Marcaine  EBL: 300  FLUID REPLACEMENT: 1500 crystalloid  TOURNIQUET TIME: 15min  Drains: None  Tranexamic Acid: 1gm IV, 2gm topical  COMPLICATIONS:  None         INDICATIONS FOR PROCEDURE: The patient has  LEFT KNEE OSTEOARTHRITIS, Val deformities, XR shows bone on bone arthritis, lateral subluxation of tibia. Patient has failed all conservative measures including anti-inflammatory medicines, narcotics, attempts at  exercise and weight loss, cortisone injections and viscosupplementation.  Risks and benefits of surgery have been discussed, questions answered.   DESCRIPTION OF PROCEDURE: The patient identified by armband, received  [facility] antibiotics[facility] Patient taken to the operating room, appropriate anesthetic  monitors were attached, and Spinal anesthesia was  induced. Tourniquet  applied high to the operative thigh. Lateral post and foot positioner  applied to the table, the lower extremity was then prepped and draped  in usual  sterile fashion from the toes to the tourniquet. Time-out procedure was performed. We began the operation, with the knee flexed 120 degrees, by making the anterior midline incision starting at handbreadth above the patella going over the patella 1 cm medial to and 4 cm distal to the tibial tubercle. Small bleeders in the skin and the  subcutaneous tissue identified and cauterized. Transverse retinaculum was incised and reflected medially and a medial parapatellar arthrotomy was accomplished. the patella was everted and theprepatellar fat pad resected. The superficial medial collateral  ligament was then elevated from anterior to posterior along the proximal  flare of the tibia and anterior half of the menisci resected. The knee was hyperflexed exposing bone on bone arthritis. Peripheral and notch osteophytes as well as the cruciate ligaments were then resected. We continued to  work our way around posteriorly along the proximal tibia,  and externally  rotated the tibia subluxing it out from underneath the femur. A McHale  retractor was placed through the notch and a lateral Hohmann retractor  placed, and we then drilled through the proximal tibia in line with the  axis of the tibia followed by an intramedullary guide rod and 2-degree  posterior slope cutting guide. The tibial cutting guide, 3 degree posterior sloped, was pinned into place allowing resection of 6 mm of bone medially and 2 mm of bone laterally. Satisfied with the tibial resection, we then  entered the distal femur 2 mm anterior to the PCL origin with the  intramedullary guide rod and applied the distal femoral cutting guide  set at 9 mm, with 5 degrees of valgus. This was pinned along the  epicondylar axis. At this point, the distal femoral cut was accomplished without difficulty. We then sized for a #4L femoral component and pinned the guide in 3 degrees of external rotation. The chamfer cutting guide was pinned into place. The anterior,  posterior, and chamfer cuts were accomplished without difficulty followed by  the Attune RP box cutting guide and the box cut. We also removed posterior osteophytes from the posterior femoral condyles. At this  time, the knee was brought into full extension. We checked our  extension and flexion gaps and found them symmetric for a 5 mm bearing. Distracting in extension with a lamina spreader, the posterior horns of the menisci were removed, and Exparel, diluted to 60 cc, with 20cc NS, and 20cc 0.5% Marcaine,was injected into the capsule and synovium of the knee. The posterior patella cut was accomplished with the 9.5 mm Attune cutting guide, sized for a 35mm dome, and the fixation pegs drilled.The knee  was then once again hyperflexed exposing the proximal tibia. We sized for a # 4 tibial base plate, applied the smokestack and the conical reamer followed by the the Delta fin keel punch. We then hammered into place the Attune RP trial femoral component, drilled the lugs, inserted a  5 mm trial bearing, trial patellar button, and took the knee through range of motion from 0-130 degrees. No thumb pressure was required for patellar Tracking. At this point, the limb was wrapped with an Esmarch bandage and the tourniquet inflated to 350 mmHg. All trial components were removed, mating surfaces irrigated with pulse lavage, and dried with suction and sponges. 10 cc of the Exparel solution was applied to the cancellus bone of the patella distal femur and proximal tibia.  After waiting 1 minute, the bony surfaces were again, dried with sponges. A double batch of DePuy HV cement with 1500 mg of Zinacef was mixed and applied to all bony metallic mating surfaces except for the posterior condyles of the femur itself. In order, we hammered into place the tibial tray and removed excess cement, the femoral component and removed excess cement. The final Attune RP bearing  was inserted, and the knee brought to full extension with  compression.  The patellar button was clamped into place, and excess cement  removed. While the cement cured the wound was irrigated out with normal saline solution pulse lavage. Ligament stability and patellar tracking were checked and found to be excellent. The parapatellar arthrotomy was closed with  running #1 Vicryl suture. The subcutaneous tissue with 0 and 2-0 undyed  Vicryl suture, and the skin with running 3-0 SQ vicryl. A dressing of Xeroform,  4 x 4, dressing sponges, Webril, and Ace wrap applied. The patient  awakened, and taken  to recovery room without difficulty.   Nestor Lewandowsky 09/15/2017, 8:51 AM

## 2017-09-15 NOTE — Anesthesia Procedure Notes (Signed)
Anesthesia Regional Block: Adductor canal block   Pre-Anesthetic Checklist: ,, timeout performed, Correct Patient, Correct Site, Correct Laterality, Correct Procedure, Correct Position, site marked, Risks and benefits discussed,  Surgical consent,  Pre-op evaluation,  At surgeon's request and post-op pain management  Laterality: Left  Prep: chloraprep       Needles:  Injection technique: Single-shot  Needle Type: Stimulator Needle - 80     Needle Length: 10cm  Needle Gauge: 21     Additional Needles:   Narrative:  Start time: 09/15/2017 7:08 AM End time: 09/15/2017 7:18 AM Injection made incrementally with aspirations every 5 mL.  Performed by: Personally

## 2017-09-15 NOTE — Transfer of Care (Signed)
Immediate Anesthesia Transfer of Care Note  Patient: Sophia Suarez  Procedure(s) Performed: TOTAL KNEE ARTHROPLASTY (Left Knee)  Patient Location: PACU  Anesthesia Type:Spinal and MAC combined with regional for post-op pain  Level of Consciousness: awake and alert   Airway & Oxygen Therapy: Patient Spontanous Breathing and Patient connected to face mask oxygen  Post-op Assessment: Report given to RN and Post -op Vital signs reviewed and stable  Post vital signs: Reviewed and stable  Last Vitals:  Vitals:   09/15/17 0544  BP: (!) 181/66  Pulse: 62  Resp: 20  Temp: 36.5 C  SpO2: 100%    Last Pain:  Vitals:   09/15/17 0544  TempSrc: Oral      Patients Stated Pain Goal: 3 (49/82/64 1583)  Complications: No apparent anesthesia complications

## 2017-09-15 NOTE — Progress Notes (Signed)
Dr Noreene LarssonJoslin ok with pt going to room w/o getting leg completely bent

## 2017-09-15 NOTE — Discharge Instructions (Signed)

## 2017-09-15 NOTE — Anesthesia Procedure Notes (Signed)
Spinal  Patient location during procedure: OR Start time: 09/15/2017 7:33 AM End time: 09/15/2017 7:39 AM Staffing Anesthesiologist: Heather RobertsSinger, Chasyn Cinque, MD Performed: anesthesiologist  Preanesthetic Checklist Completed: patient identified, surgical consent, pre-op evaluation, timeout performed, IV checked, risks and benefits discussed and monitors and equipment checked Spinal Block Patient position: sitting Prep: DuraPrep Patient monitoring: cardiac monitor, continuous pulse ox and blood pressure Approach: right paramedian Location: L2-3 Injection technique: single-shot Needle Needle type: Pencan  Needle gauge: 24 G Needle length: 9 cm Additional Notes Functioning IV was confirmed and monitors were applied. Sterile prep and drape, including hand hygiene and sterile gloves were used. The patient was positioned and the spine was prepped. The skin was anesthetized with lidocaine.  Free flow of clear CSF was obtained prior to injecting local anesthetic into the CSF.  The spinal needle aspirated freely following injection.  The needle was carefully withdrawn.  The patient tolerated the procedure well. Difficult placement.

## 2017-09-15 NOTE — Interval H&P Note (Signed)
History and Physical Interval Note:  09/15/2017 7:09 AM  Sophia JuniorPatricia M Wigen  has presented today for surgery, with the diagnosis of LEFT KNEE OSTEOARTHRITIS  The various methods of treatment have been discussed with the patient and family. After consideration of risks, benefits and other options for treatment, the patient has consented to  Procedure(s): TOTAL KNEE ARTHROPLASTY (Left) as a surgical intervention .  The patient's history has been reviewed, patient examined, no change in status, stable for surgery.  I have reviewed the patient's chart and labs.  Questions were answered to the patient's satisfaction.     Nestor LewandowskyFrank J Olukemi Panchal

## 2017-09-15 NOTE — Evaluation (Signed)
Physical Therapy Evaluation Patient Details Name: Sophia Suarez MRN: [id number] DOB: December 13, 1940 Today's Date: 09/15/2017   History of Present Illness  Pt is a 77 y/o female s/p elective L TKA. PMH includes CKD, DM, HTN, and CVA.   Clinical Impression  Pt is s/p surgery above with deficits below. Pt with incontinent episode during gait, therefore mobility limited to chair. Required min to min guard A with mobility with RW. Initiated supine HEP and reviewed knee precautions. Will continue to follow acutely to maximize functional mobility independence and safety.     Follow Up Recommendations Follow surgeon's recommendation for DC plan and follow-up therapies;Supervision for mobility/OOB    Equipment Recommendations  3in1 (PT)    Recommendations for Other Services OT consult     Precautions / Restrictions Precautions Precautions: Knee Precaution Booklet Issued: Yes (comment) Precaution Comments: REviewed supine ther ex and knee precautions with pt.  Restrictions Weight Bearing Restrictions: Yes LLE Weight Bearing: Weight bearing as tolerated      Mobility  Bed Mobility Overal bed mobility: Needs Assistance Bed Mobility: Supine to Sit     Supine to sit: Supervision     General bed mobility comments: Supervision for safety. Pt using UEs to assist with moving LLE towards EOB.   Transfers Overall transfer level: Needs assistance Equipment used: Rolling walker (2 wheeled) Transfers: Sit to/from Stand Sit to Stand: Min assist         General transfer comment: Min A for steadying assist. Verbal cues for safe hand placement.   Ambulation/Gait Ambulation/Gait assistance: Min guard Ambulation Distance (Feet): 5 Feet Assistive device: Rolling walker (2 wheeled) Gait Pattern/deviations: Step-to pattern;Decreased step length - right;Decreased step length - left;Decreased weight shift to left;Antalgic Gait velocity: Decreased  Gait velocity interpretation: Below normal speed  for age/gender General Gait Details: Slow, antalgic gait. Verbal cues for sequencing using RW. Distance limited to chair as pt with episode of incontinence.   Stairs            Wheelchair Mobility    Modified Rankin (Stroke Patients Only)       Balance Overall balance assessment: Needs assistance Sitting-balance support: No upper extremity supported;Feet supported Sitting balance-Leahy Scale: Good     Standing balance support: No upper extremity supported;Bilateral upper extremity supported;During functional activity Standing balance-Leahy Scale: Fair Standing balance comment: Able to maintain static standing without UE support to clean self following episode of incontinence.                              Pertinent Vitals/Pain Pain Assessment: 0-10 Pain Score: 3  Pain Location: L knee  Pain Descriptors / Indicators: Aching;Operative site guarding Pain Intervention(s): Limited activity within patient's tolerance;Monitored during session;Repositioned    Home Living Family/patient expects to be discharged to:: Private residence Living Arrangements: Alone Available Help at Discharge: Family;Available 24 hours/day Type of Home: House Home Access: Stairs to enter Entrance Stairs-Rails: Right Entrance Stairs-Number of Steps: 2 Home Layout: One level Home Equipment: Tub bench;Walker - 2 wheels;Cane - single point      Prior Function Level of Independence: Independent with assistive device(s)         Comments: Used cane for steps and for walking dog.      Hand Dominance   Dominant Hand: Right    Extremity/Trunk Assessment   Upper Extremity Assessment Upper Extremity Assessment: Defer to OT evaluation    Lower Extremity Assessment Lower Extremity Assessment: LLE deficits/detail LLE Deficits /  Details: Deficits consistent with post op pain and weakness. Able to perform ther ex below.  LLE Sensation: WNL    Cervical / Trunk Assessment Cervical /  Trunk Assessment: Normal  Communication   Communication: No difficulties  Cognition Arousal/Alertness: Awake/alert Behavior During Therapy: WFL for tasks assessed/performed Overall Cognitive Status: Within Functional Limits for tasks assessed                                        General Comments      Exercises Total Joint Exercises Ankle Circles/Pumps: AROM;Both;20 reps Quad Sets: AROM;Left;10 reps Towel Squeeze: AROM;Both;10 reps Heel Slides: AROM;Left;10 reps   Assessment/Plan    PT Assessment Patient needs continued PT services  PT Problem List Decreased strength;Decreased range of motion;Decreased balance;Decreased mobility;Decreased knowledge of use of DME;Decreased knowledge of precautions;Pain       PT Treatment Interventions DME instruction;Gait training;Stair training;Functional mobility training;Therapeutic activities;Therapeutic exercise;Balance training;Neuromuscular re-education;Patient/family education    PT Goals (Current goals can be found in the Care Plan section)  Acute Rehab PT Goals Patient Stated Goal: to go home  PT Goal Formulation: With patient Time For Goal Achievement: 09/29/17 Potential to Achieve Goals: Good    Frequency 7X/week   Barriers to discharge        Co-evaluation               AM-PAC PT "6 Clicks" Daily Activity  Outcome Measure Difficulty turning over in bed (including adjusting bedclothes, sheets and blankets)?: A Little Difficulty moving from lying on back to sitting on the side of the bed? : A Little Difficulty sitting down on and standing up from a chair with arms (e.g., wheelchair, bedside commode, etc,.)?: Unable Help needed moving to and from a bed to chair (including a wheelchair)?: A Little Help needed walking in hospital room?: A Little Help needed climbing 3-5 steps with a railing? : A Lot 6 Click Score: 15    End of Session Equipment Utilized During Treatment: Gait belt Activity  Tolerance: Patient tolerated treatment well Patient left: in chair;with call bell/phone within reach;with family/visitor present Nurse Communication: Mobility status PT Visit Diagnosis: Other abnormalities of gait and mobility (R26.89);Pain Pain - Right/Left: Left Pain - part of body: Knee    Time: 1440-1510 PT Time Calculation (min) (ACUTE ONLY): 30 min   Charges:   PT Evaluation $PT Eval Low Complexity: 1 Low PT Treatments $Therapeutic Activity: 8-22 mins   PT G Codes:        Gladys Damme, PT, DPT  Acute Rehabilitation Services  Pager: [phone]   Lehman Prom 09/15/2017, 4:47 PM

## 2017-09-16 LAB — CBC
HEMATOCRIT: 36.3 % (ref 36.0–46.0)
Hemoglobin: 11.8 g/dL — ABNORMAL LOW (ref 12.0–15.0)
MCH: 32.3 pg (ref 26.0–34.0)
MCHC: 32.5 g/dL (ref 30.0–36.0)
MCV: 99.5 fL (ref 78.0–100.0)
PLATELETS: 248 10*3/uL (ref 150–400)
RBC: 3.65 MIL/uL — ABNORMAL LOW (ref 3.87–5.11)
RDW: 13.1 % (ref 11.5–15.5)
WBC: 12.8 10*3/uL — AB (ref 4.0–10.5)

## 2017-09-16 LAB — BASIC METABOLIC PANEL
ANION GAP: 9 (ref 5–15)
BUN: 10 mg/dL (ref 6–20)
CALCIUM: 8.7 mg/dL — AB (ref 8.9–10.3)
CO2: 24 mmol/L (ref 22–32)
CREATININE: 0.87 mg/dL (ref 0.44–1.00)
Chloride: 104 mmol/L (ref 101–111)
Glucose, Bld: 167 mg/dL — ABNORMAL HIGH (ref 65–99)
Potassium: 4.2 mmol/L (ref 3.5–5.1)
SODIUM: 137 mmol/L (ref 135–145)

## 2017-09-16 NOTE — Progress Notes (Signed)
Physical Therapy Treatment Patient Details Name: Sophia Suarez MRN: [id number] DOB: Sep 18, 1940 Today's Date: 09/16/2017    History of Present Illness Pt is a 77 y/o female s/p elective L TKA. PMH includes CKD, DM, HTN, and CVA.     PT Comments    Patient continues to make progress toward mobility goals. Patient needs to practice stairs again next session. Current plan remains appropriate.    Follow Up Recommendations  Follow surgeon's recommendation for DC plan and follow-up therapies;Supervision for mobility/OOB     Equipment Recommendations  3in1 (PT);Other (comment)(youth size RW)    Recommendations for Other Services OT consult     Precautions / Restrictions Precautions Precautions: Knee Precaution Booklet Issued: No Precaution Comments: knee precautions reviewed with pt Restrictions Weight Bearing Restrictions: Yes LLE Weight Bearing: Weight bearing as tolerated    Mobility  Bed Mobility Overal bed mobility: Modified Independent             General bed mobility comments: increased time and effort needed  Transfers Overall transfer level: Needs assistance Equipment used: Rolling walker (2 wheeled) Transfers: Sit to/from Stand Sit to Stand: Min guard         General transfer comment: min guard for safety  Ambulation/Gait Ambulation/Gait assistance: Min guard Ambulation Distance (Feet): 100 Feet Assistive device: Rolling walker (2 wheeled) Gait Pattern/deviations: Decreased step length - right;Decreased weight shift to left;Antalgic;Step-through pattern;Decreased stance time - left;Trunk flexed Gait velocity: Decreased    General Gait Details: cues for sequencing, proximity to RW, and step length symmetry   Stairs            Wheelchair Mobility    Modified Rankin (Stroke Patients Only)       Balance Overall balance assessment: Needs assistance Sitting-balance support: No upper extremity supported;Feet supported Sitting balance-Leahy  Scale: Good     Standing balance support: Bilateral upper extremity supported;During functional activity Standing balance-Leahy Scale: Fair Standing balance comment: pt able to static stand without UE support                            Cognition Arousal/Alertness: Awake/alert Behavior During Therapy: WFL for tasks assessed/performed Overall Cognitive Status: Within Functional Limits for tasks assessed                                        Exercises Total Joint Exercises Quad Sets: AROM;Left;10 reps Short Arc QuadBarbaraann Boys: AAROM;Left;10 reps Heel Slides: AAROM;Left;10 reps Hip ABduction/ADduction: AROM;Left;10 reps Straight Leg Raises: AAROM;Left;5 reps    General Comments        Pertinent Vitals/Pain Pain Assessment: Faces Faces Pain Scale: Hurts little more Pain Location: L knee  Pain Descriptors / Indicators: Guarding;Sore Pain Intervention(s): Limited activity within patient's tolerance;Monitored during session;Premedicated before session;Repositioned    Home Living Family/patient expects to be discharged to:: Private residence Living Arrangements: Alone Available Help at Discharge: Family;Available 24 hours/day Type of Home: House Home Access: Stairs to enter Entrance Stairs-Rails: Right Home Layout: One level Home Equipment: Tub bench;Walker - 2 wheels;Cane - single point;Adaptive equipment      Prior Function Level of Independence: Independent with assistive device(s)      Comments: Used cane for steps and for walking dog.    PT Goals (current goals can now be found in the care plan section) Acute Rehab PT Goals Patient Stated Goal: to go home  PT Goal Formulation: With patient Time For Goal Achievement: 09/29/17 Potential to Achieve Goals: Good Progress towards PT goals: Progressing toward goals    Frequency    7X/week      PT Plan Current plan remains appropriate    Co-evaluation              AM-PAC PT "6 Clicks"  Daily Activity  Outcome Measure  Difficulty turning over in bed (including adjusting bedclothes, sheets and blankets)?: A Little Difficulty moving from lying on back to sitting on the side of the bed? : A Little Difficulty sitting down on and standing up from a chair with arms (e.g., wheelchair, bedside commode, etc,.)?: Unable Help needed moving to and from a bed to chair (including a wheelchair)?: A Little Help needed walking in hospital room?: A Little Help needed climbing 3-5 steps with a railing? : A Little 6 Click Score: 16    End of Session Equipment Utilized During Treatment: Gait belt Activity Tolerance: Patient tolerated treatment well Patient left: with call bell/phone within reach;in bed;with family/visitor present;with SCD's reapplied Nurse Communication: Mobility status PT Visit Diagnosis: Other abnormalities of gait and mobility (R26.89);Pain Pain - Right/Left: Left Pain - part of body: Knee     Time: 1439-1510 PT Time Calculation (min) (ACUTE ONLY): 31 min  Charges:  $Gait Training: 8-22 mins $Therapeutic Exercise: 8-22 mins                    G Codes:       Erline Levine, PTA Pager: [phone]     Carolynne Edouard 09/16/2017, 3:26 PM

## 2017-09-16 NOTE — Progress Notes (Signed)
Physical Therapy Treatment Patient Details Name: Sophia Suarez MRN: [id number] DOB: [birth year] Today's Date: 09/16/2017    History of Present Illness Pt is a 77 y/o female s/p elective L TKA. PMH includes CKD, DM, HTN, and CVA.     PT Comments    Patient is making progress toward mobility goals. Pt limited slightly by dizziness with mobility. Pt will need to practice stairs again. Continue to progress as tolerated.    Follow Up Recommendations  Follow surgeon's recommendation for DC plan and follow-up therapies;Supervision for mobility/OOB     Equipment Recommendations  3in1 (PT)    Recommendations for Other Services OT consult     Precautions / Restrictions Precautions Precautions: Knee Restrictions Weight Bearing Restrictions: Yes LLE Weight Bearing: Weight bearing as tolerated    Mobility  Bed Mobility               General bed mobility comments: pt OOB in chair upon arrival  Transfers Overall transfer level: Needs assistance Equipment used: Rolling walker (2 wheeled) Transfers: Sit to/from Stand Sit to Stand: Min assist         General transfer comment: assist to power up into standing; cues for safe hand placement  Ambulation/Gait Ambulation/Gait assistance: Min guard;Min assist Ambulation Distance (Feet): 80 Feet Assistive device: Rolling walker (2 wheeled) Gait Pattern/deviations: Decreased step length - right;Decreased weight shift to left;Antalgic;Step-through pattern;Decreased stance time - left;Trunk flexed Gait velocity: Decreased    General Gait Details: cues for sequencing, posture, and safe use of AD   Stairs Stairs: Yes   Stair Management: One rail Right;Step to pattern;Forwards;With cane Number of Stairs: 2 General stair comments: cues for sequencing and technique using R hand rail and SPC L side; assist to steady   Wheelchair Mobility    Modified Rankin (Stroke Patients Only)       Balance Overall balance assessment:  Needs assistance Sitting-balance support: No upper extremity supported;Feet supported Sitting balance-Leahy Scale: Good     Standing balance support: Bilateral upper extremity supported;During functional activity Standing balance-Leahy Scale: Poor                              Cognition Arousal/Alertness: Awake/alert Behavior During Therapy: WFL for tasks assessed/performed Overall Cognitive Status: Within Functional Limits for tasks assessed                                        Exercises      General Comments General comments (skin integrity, edema, etc.): family present      Pertinent Vitals/Pain Pain Assessment: Faces Faces Pain Scale: Hurts little more Pain Location: L knee  Pain Descriptors / Indicators: Guarding;Sore Pain Intervention(s): Limited activity within patient's tolerance;Monitored during session;Repositioned;RN gave pain meds during session    Home Living                      Prior Function            PT Goals (current goals can now be found in the care plan section) Acute Rehab PT Goals Patient Stated Goal: to go home  PT Goal Formulation: With patient Time For Goal Achievement: 09/29/17 Potential to Achieve Goals: Good Progress towards PT goals: Progressing toward goals    Frequency    7X/week      PT Plan Current plan remains appropriate  Co-evaluation              AM-PAC PT "6 Clicks" Daily Activity  Outcome Measure  Difficulty turning over in bed (including adjusting bedclothes, sheets and blankets)?: A Little Difficulty moving from lying on back to sitting on the side of the bed? : A Little Difficulty sitting down on and standing up from a chair with arms (e.g., wheelchair, bedside commode, etc,.)?: Unable Help needed moving to and from a bed to chair (including a wheelchair)?: A Little Help needed walking in hospital room?: A Little Help needed climbing 3-5 steps with a railing? : A  Little 6 Click Score: 16    End of Session Equipment Utilized During Treatment: Gait belt Activity Tolerance: Patient tolerated treatment well Patient left: in chair;with call bell/phone within reach;with family/visitor present Nurse Communication: Mobility status PT Visit Diagnosis: Other abnormalities of gait and mobility (R26.89);Pain Pain - Right/Left: Left Pain - part of body: Knee     Time: 1610-9604 PT Time Calculation (min) (ACUTE ONLY): 39 min  Charges:  $Gait Training: 23-37 mins $Therapeutic Activity: 8-22 mins                    G Codes:       Erline Levine, PTA Pager: [phone]     Carolynne Edouard 09/16/2017, 10:38 AM

## 2017-09-16 NOTE — Evaluation (Signed)
Occupational Therapy Evaluation and Discharge Patient Details Name: Sophia Suarez MRN: [id number] DOB: 06-24-1941 Today's Date: 09/16/2017    History of Present Illness Pt is a 77 y/o female s/p elective L TKA. PMH includes CKD, DM, HTN, and CVA.    Clinical Impression   Pt reports she was independent with ADL PTA. Currently pt overall min guard assist for functional mobility with the exception of min assist for LB ADL. Pt planning to d/c home with 24/7 supervision from family. No further acute OT needs identified; signing off at this time. Please re-consult if needs change. Thank you for this referral.    Follow Up Recommendations  No OT follow up;Supervision/Assistance - 24 hour(initially)    Equipment Recommendations  None recommended by OT    Recommendations for Other Services       Precautions / Restrictions Precautions Precautions: Knee Precaution Booklet Issued: No Restrictions Weight Bearing Restrictions: Yes LLE Weight Bearing: Weight bearing as tolerated      Mobility Bed Mobility               General bed mobility comments: Ot OOB in chair upon arrival  Transfers Overall transfer level: Needs assistance Equipment used: Rolling walker (2 wheeled) Transfers: Sit to/from Stand Sit to Stand: Min guard         General transfer comment: Close min guard for safety; no physical assist required. Cues for hand placement with RW    Balance Overall balance assessment: Needs assistance Sitting-balance support: Feet supported;No upper extremity supported Sitting balance-Leahy Scale: Good     Standing balance support: Bilateral upper extremity supported Standing balance-Leahy Scale: Poor Standing balance comment: RW for support                           ADL either performed or assessed with clinical judgement   ADL Overall ADL's : Needs assistance/impaired Eating/Feeding: Set up;Sitting   Grooming: Set up;Sitting   Upper Body Bathing: Set  up;Supervision/ safety;Sitting   Lower Body Bathing: Minimal assistance;Sit to/from stand   Upper Body Dressing : Set up;Supervision/safety;Sitting   Lower Body Dressing: Minimal assistance;Sit to/from stand Lower Body Dressing Details (indicate cue type and reason): Educated on use of reacher for increased independence as pt has one at home. Family to assist as needed. Educated on compensatory strategies for LB ADL. Toilet Transfer: Min guard;Ambulation;Comfort height toilet;RW Statistician Details (indicate cue type and reason): Only used support on L side to simulate home environment; pt able to return demo technique with min guard       Tub/Shower Transfer Details (indicate cue type and reason): Pt reports she has used tub bench a few times at home; is familiar with technique. Family will provide supervision/assist as needed Functional mobility during ADLs: Min Warden/ranger     Praxis      Pertinent Vitals/Pain Pain Assessment: Faces Faces Pain Scale: Hurts little more Pain Location: L knee Pain Descriptors / Indicators: Aching;Sore Pain Intervention(s): Monitored during session;Repositioned;Ice applied     Hand Dominance Right   Extremity/Trunk Assessment Upper Extremity Assessment Upper Extremity Assessment: Overall WFL for tasks assessed   Lower Extremity Assessment Lower Extremity Assessment: Defer to PT evaluation   Cervical / Trunk Assessment Cervical / Trunk Assessment: Normal   Communication Communication Communication: No difficulties   Cognition Arousal/Alertness: Awake/alert Behavior During Therapy: WFL for tasks assessed/performed Overall Cognitive  Status: Within Functional Limits for tasks assessed                                     General Comments  family present    Exercises     Shoulder Instructions      Home Living Family/patient expects to be discharged to:: Private  residence Living Arrangements: Alone Available Help at Discharge: Family;Available 24 hours/day Type of Home: House Home Access: Stairs to enter Entergy CorporationEntrance Stairs-Number of Steps: 2 Entrance Stairs-Rails: Right Home Layout: One level     Bathroom Shower/Tub: IT trainerTub/shower unit;Curtain   Bathroom Toilet: Standard     Home Equipment: Tub bench;Walker - 2 wheels;Cane - single point;Adaptive equipment Adaptive Equipment: Reacher        Prior Functioning/Environment Level of Independence: Independent with assistive device(s)        Comments: Used cane for steps and for walking dog.         OT Problem List:        OT Treatment/Interventions:      OT Goals(Current goals can be found in the care plan section) Acute Rehab OT Goals Patient Stated Goal: to go home  OT Goal Formulation: All assessment and education complete, DC therapy  OT Frequency:     Barriers to D/C:            Co-evaluation              AM-PAC PT "6 Clicks" Daily Activity     Outcome Measure Help from another person eating meals?: None Help from another person taking care of personal grooming?: None Help from another person toileting, which includes using toliet, bedpan, or urinal?: A Little Help from another person bathing (including washing, rinsing, drying)?: A Little Help from another person to put on and taking off regular upper body clothing?: None Help from another person to put on and taking off regular lower body clothing?: A Little 6 Click Score: 21   End of Session Equipment Utilized During Treatment: Rolling walker CPM Left Knee CPM Left Knee: Off  Activity Tolerance: Patient tolerated treatment well Patient left: in chair;with call bell/phone within reach  OT Visit Diagnosis: Other abnormalities of gait and mobility (R26.89);Pain Pain - Right/Left: Left Pain - part of body: Knee                Time: [id number] OT Time Calculation (min): 20 min Charges:  OT General  Charges $OT Visit: 1 Visit OT Evaluation $OT Eval Moderate Complexity: 1 Mod G-Codes:     Malachy Coleman A. Brett Albinooffey, M.S., OTR/L Pager: [phone]  Gaye AlkenBailey A Jennet Scroggin 09/16/2017, 11:55 AM

## 2017-09-16 NOTE — Progress Notes (Addendum)
Subjective: 1 Day Post-Op Procedure(s) (LRB): TOTAL KNEE ARTHROPLASTY (Left) Patient reports pain as moderate. Taking by mouth and voiding ok.   Anticipated LOS equal to or greater than 2 midnights due to - Age 76 and older with one or more of the following:  - Obesity  - ASA Class 3 and higher  - Expected need for hospital services (PT, OT, Nursing) required for safe  discharge    OR   - Unanticipated findings during/Post Surgery: Slow post-op progression: GI, pain control, mobility  - The patient made slow progress with physical therapy and was dizzy when she attempted to ambulate.   Objective: Vital signs in last 24 hours: Temp:  [97.5 F (36.4 C)-98.7 F (37.1 C)] 98.6 F (37 C) (03/09 0700) Pulse Rate:  [54-80] 58 (03/09 0700) Resp:  [11-20] 18 (03/09 0700) BP: (130-189)/(49-87) 143/55 (03/09 0700) SpO2:  [96 %-100 %] 97 % (03/09 0700)  Intake/Output from previous day: 03/08 0701 - 03/09 0700 In: 640.4 [P.O.:480; I.V.:160.4] Out: 300 [Urine:100; Blood:200] Intake/Output this shift: No intake/output data recorded.  Recent Labs    09/16/17 0354  HGB 11.8*   Recent Labs    09/16/17 0354  WBC 12.8*  RBC 3.65*  HCT 36.3  PLT 248   Recent Labs    09/16/17 0354  NA 137  K 4.2  CL 104  CO2 24  BUN 10  CREATININE 0.87  GLUCOSE 167*  CALCIUM 8.7*   No results for input(s): LABPT, INR in the last 72 hours.  Left knee exam:  Neurologically intact Sensation intact distally Intact pulses distally Dorsiflexion/Plantar flexion intact Incision: dressing C/D/I Compartment soft  Assessment/Plan: 1 Day Post-Op Procedure(s) (LRB): TOTAL KNEE ARTHROPLASTY (Left)  Plan: Up with therapy Discharge home with home health if passes PT today The patient was dizzy with physical therapy and it was not felt that it was safe for her to be discharged today.  We will keep her until tomorrow and have her do therapy later this afternoon and in the morning prior to  discharge home tomorrow.This will guarantee safety and reduce the risk of readmission to the hospital. Plavix ASA for dvt prophylaxis.  Fletcher Rathbun G 09/16/2017, 9:17 AM

## 2017-09-17 LAB — CBC
HCT: 35.9 % — ABNORMAL LOW (ref 36.0–46.0)
Hemoglobin: 11.9 g/dL — ABNORMAL LOW (ref 12.0–15.0)
MCH: 33.1 pg (ref 26.0–34.0)
MCHC: 33.1 g/dL (ref 30.0–36.0)
MCV: 100 fL (ref 78.0–100.0)
PLATELETS: 215 10*3/uL (ref 150–400)
RBC: 3.59 MIL/uL — ABNORMAL LOW (ref 3.87–5.11)
RDW: 13.2 % (ref 11.5–15.5)
WBC: 14.3 10*3/uL — ABNORMAL HIGH (ref 4.0–10.5)

## 2017-09-17 NOTE — Progress Notes (Signed)
Subjective: 2 Days Post-Op Procedure(s) (LRB): TOTAL KNEE ARTHROPLASTY (Left) Patient reports pain as moderate.  Taking by mouth and voiding okay.  Progressing with physical therapy.  No longer having the dizziness that she had yesterday when she ambulated..  She is ready to go home.  Objective: Vital signs in last 24 hours: Temp:  [98.4 F (36.9 C)-98.6 F (37 C)] 98.4 F (36.9 C) (03/10 0424) Pulse Rate:  [63-70] 63 (03/10 0424) Resp:  [16-18] 16 (03/10 0424) BP: (137-160)/(52-76) 140/52 (03/10 0424) SpO2:  [94 %-98 %] 96 % (03/10 0424)  Intake/Output from previous day: 03/09 0701 - 03/10 0700 In: 720 [P.O.:720] Out: -  Intake/Output this shift: No intake/output data recorded.  Recent Labs    09/16/17 0354 09/17/17 0543  HGB 11.8* 11.9*   Recent Labs    09/16/17 0354 09/17/17 0543  WBC 12.8* 14.3*  RBC 3.65* 3.59*  HCT 36.3 35.9*  PLT 248 215   Recent Labs    09/16/17 0354  NA 137  K 4.2  CL 104  CO2 24  BUN 10  CREATININE 0.87  GLUCOSE 167*  CALCIUM 8.7*   No results for input(s): LABPT, INR in the last 72 hours. Left knee exam: Neurovascular intact Sensation intact distally Intact pulses distally Dorsiflexion/Plantar flexion intact Incision: dressing C/D/I Compartment soft  Assessment/Plan: 2 Days Post-Op Procedure(s) (LRB): TOTAL KNEE ARTHROPLASTY (Left) Plan: Resume Plavix/aspirin which she was taking preoperatively for DVT prophylaxis. Rx for oxycodone and tizanidine. Weight-bear as tolerated on the left. Up with therapy Discharge home with home health Follow-up with Dr. Turner Danielsowan in 2 weeks. Truc Winfree G 09/17/2017, 11:37 AM

## 2017-09-17 NOTE — Progress Notes (Signed)
Physical Therapy Treatment Patient Details Name: Sophia Suarez MRN: [id number] DOB: Nov 10, 1940 Today's Date: 09/17/2017    History of Present Illness Pt is a 77 y/o female s/p elective L TKA. PMH includes CKD, DM, HTN, and CVA.     PT Comments    PM session focusing on improving independence with functional mobility. Tolerable to increased distances with less guarding required. Good safety demonstrated by patient with gait, transfers, and AD. Patient again educated on knee precautions with no pillow under knee with good verbal understanding.       Follow Up Recommendations  Follow surgeon's recommendation for DC plan and follow-up therapies;Supervision for mobility/OOB     Equipment Recommendations  3in1 (PT);Other (comment)(youth size RW)    Recommendations for Other Services OT consult     Precautions / Restrictions Precautions Precautions: Knee Precaution Comments: knee precautions reviewed with pt Restrictions Weight Bearing Restrictions: Yes LLE Weight Bearing: Weight bearing as tolerated    Mobility  Bed Mobility Overal bed mobility: Needs Assistance Bed Mobility: Sit to Supine       Sit to supine: Min assist   General bed mobility comments: Min A for L LE management onto bed  Transfers Overall transfer level: Needs assistance Equipment used: Rolling walker (2 wheeled) Transfers: Sit to/from Stand Sit to Stand: Min guard         General transfer comment: improved transfer status and safety  Ambulation/Gait Ambulation/Gait assistance: Supervision Ambulation Distance (Feet): 200 Feet Assistive device: Rolling walker (2 wheeled) Gait Pattern/deviations: Step-through pattern;Decreased step length - right;Decreased stance time - left;Decreased weight shift to left;Antalgic Gait velocity: Decreased  Gait velocity interpretation: Below normal speed for age/gender     Stairs            Wheelchair Mobility    Modified Rankin (Stroke Patients  Only)       Balance Overall balance assessment: Needs assistance Sitting-balance support: No upper extremity supported;Feet supported Sitting balance-Leahy Scale: Good     Standing balance support: Bilateral upper extremity supported;During functional activity Standing balance-Leahy Scale: Fair Standing balance comment: use of RW for dynamic gait; able to static stand without support                            Cognition Arousal/Alertness: Awake/alert Behavior During Therapy: WFL for tasks assessed/performed Overall Cognitive Status: Within Functional Limits for tasks assessed                                        Exercises      General Comments        Pertinent Vitals/Pain Pain Assessment: No/denies pain    Home Living                      Prior Function            PT Goals (current goals can now be found in the care plan section) Acute Rehab PT Goals Patient Stated Goal: to go home  PT Goal Formulation: With patient Time For Goal Achievement: 09/29/17 Potential to Achieve Goals: Good Progress towards PT goals: Progressing toward goals    Frequency    7X/week      PT Plan Current plan remains appropriate    Co-evaluation              AM-PAC PT "6 Clicks" Daily  Activity  Outcome Measure  Difficulty turning over in bed (including adjusting bedclothes, sheets and blankets)?: A Little Difficulty moving from lying on back to sitting on the side of the bed? : A Little Difficulty sitting down on and standing up from a chair with arms (e.g., wheelchair, bedside commode, etc,.)?: Unable Help needed moving to and from a bed to chair (including a wheelchair)?: A Little Help needed walking in hospital room?: A Little Help needed climbing 3-5 steps with a railing? : A Little 6 Click Score: 16    End of Session Equipment Utilized During Treatment: Gait belt Activity Tolerance: Patient tolerated treatment well Patient  left: in bed;with call bell/phone within reach;with nursing/sitter in room;with family/visitor present Nurse Communication: Mobility status PT Visit Diagnosis: Other abnormalities of gait and mobility (R26.89);Pain     Time: 1240-1255 PT Time Calculation (min) (ACUTE ONLY): 15 min  Charges:  $Gait Training: 8-22 mins                    G Codes:       Kipp LaurenceStephanie R Aaron, PT, DPT 09/17/17 1:02 PM

## 2017-09-17 NOTE — Discharge Summary (Signed)
Patient ID: Sophia Suarez MRN: [id number] DOB/AGE: Mar 14, 1941 77 y.o.  Admit date: 09/15/2017 Discharge date: 09/17/2017  Admission Diagnoses:  Principal Problem:   Degenerative arthritis of left knee Active Problems:   Primary osteoarthritis of left knee   Discharge Diagnoses:  Same  Past Medical History:  Diagnosis Date  . Allergic rhinitis, seasonal   . Anemia   . Arthritis   . Asthma   . Carotid artery occlusion   . Chronic kidney disease   . Chronic pain   . Complication of anesthesia    makes her hyper  . Diabetes mellitus    no medication  diet controlled  . Diverticulitis   . Dyspnea    with exertion  . GERD (gastroesophageal reflux disease)   . Heart murmur   . Hyperlipidemia   . Hypertension   . Lumbago   . Panic attacks   . Stroke (HCC)   . Urinary incontinence     Surgeries: Procedure(s): TOTAL KNEE ARTHROPLASTY on 09/15/2017   Consultants:   Discharged Condition: Improved  Hospital Course: Sophia Suarez is an 77 y.o. female who was admitted 09/15/2017 for operative treatment ofDegenerative arthritis of left knee. Patient has severe unremitting pain that affects sleep, daily activities, and work/hobbies. After pre-op clearance the patient was taken to the operating room on 09/15/2017 and underwent  Procedure(s): TOTAL KNEE ARTHROPLASTY.    Patient was given perioperative antibiotics:  Anti-infectives (From admission, onward)   Start     Dose/Rate Route Frequency Ordered Stop   09/15/17 0551  ceFAZolin (ANCEF) IVPB 2g/100 mL premix     2 g 200 mL/hr over 30 Minutes Intravenous On call to O.R. 09/15/17 [id number] 09/15/17 0737       Patient was given sequential compression devices, early ambulation, and chemoprophylaxis to prevent DVT.  The patient progressed slowly with physical therapy.  Based upon her age being 24 years, and her expected need for hospital services including physical therapy and nursing for safe discharge she was kept 2 nights.  We did  not feel that it was safe to discharge the patient home on postop day 1 as she was dizzy with physical therapy.  On the date of discharge her vital signs were stable.  Her left knee dressing was clean and dry.  Her calf was soft and she was doing well with physical therapy.  Patient benefited maximally from hospital stay and there were no complications.    Recent vital signs:  Patient Vitals for the past 24 hrs:  BP Temp Temp src Pulse Resp SpO2  09/17/17 0424 (!) 140/52 98.4 F (36.9 C) Oral 63 16 96 %  09/16/17 2027 (!) 160/73 98.6 F (37 C) Oral 68 17 98 %  09/16/17 1330 137/76 98.4 F (36.9 C) Oral 70 18 94 %     Recent laboratory studies:  Recent Labs    09/16/17 0354 09/17/17 0543  WBC 12.8* 14.3*  HGB 11.8* 11.9*  HCT 36.3 35.9*  PLT 248 215  NA 137  --   K 4.2  --   CL 104  --   CO2 24  --   BUN 10  --   CREATININE 0.87  --   GLUCOSE 167*  --   CALCIUM 8.7*  --      Discharge Medications:   Allergies as of 09/17/2017      Reactions   Clindamycin/lincomycin Anaphylaxis   Septra [sulfamethoxazole-trimethoprim] Anaphylaxis   Sulfa Antibiotics Anaphylaxis   Melatonin Other (See Comments), Hypertension  Over active    Horse-derived Products Itching   Latex Hives   Mold Extract [trichophyton] Other (See Comments)   Congestion.   Pollen Extract Other (See Comments)   Congestion.      Medication List    STOP taking these medications   TYLENOL 8 HOUR ARTHRITIS PAIN 650 MG CR tablet Generic drug:  acetaminophen     TAKE these medications   alendronate 70 MG tablet Commonly known as:  FOSAMAX Take 70 mg by mouth every Tuesday. Take with a full glass of water on an empty stomach.   aspirin EC 81 MG tablet Take 81 mg by mouth at bedtime.   Biotin 1000 MCG tablet Take 2,000 mcg by mouth daily.   brimonidine 0.2 % ophthalmic solution Commonly known as:  ALPHAGAN Place 1 drop into both eyes 2 (two) times daily.   CALCIUM PLUS VITAMIN D3 PO Take 1  tablet by mouth daily.   clopidogrel 75 MG tablet Commonly known as:  PLAVIX TAKE 1 TABLET (75 MG TOTAL) BY MOUTH DAILY.   diphenhydrAMINE 25 mg capsule Commonly known as:  BENADRYL Take 25 mg by mouth daily.   docusate sodium 100 MG capsule Commonly known as:  COLACE Take 100 mg by mouth at bedtime as needed for mild constipation.   dorzolamide 2 % ophthalmic solution Commonly known as:  TRUSOPT Place 1 drop into both eyes 2 (two) times daily.   Fish Oil 1000 MG Caps Take 2,000 mg by mouth daily.   hydrochlorothiazide 12.5 MG capsule Commonly known as:  MICROZIDE Take 1 capsule (12.5 mg total) by mouth daily.   losartan 100 MG tablet Commonly known as:  COZAAR Take 100 mg by mouth daily.   MOVE FREE JOINT HEALTH ADVANCE Tabs Take 2 tablets by mouth daily.   multivitamin with minerals Tabs tablet Take 1 tablet by mouth daily. Centrum Silver   omeprazole 20 MG capsule Commonly known as:  PRILOSEC Take 20 mg by mouth 2 (two) times daily.   oxyCODONE-acetaminophen 5-325 MG tablet Commonly known as:  PERCOCET/ROXICET Take 1 tablet by mouth every 4 (four) hours as needed for severe pain.   PROBIOTIC DAILY PO Take 1 capsule by mouth daily.   QUNOL ULTRA COQ10 PO Take 1 capsule by mouth daily.   simvastatin 40 MG tablet Commonly known as:  ZOCOR Take 40 mg by mouth every evening.   sodium chloride 0.65 % Soln nasal spray Commonly known as:  OCEAN Place 1 spray into both nostrils 4 (four) times daily as needed for congestion.   timolol 0.5 % ophthalmic solution Commonly known as:  BETIMOL Place 1 drop into both eyes 2 (two) times daily.   tiZANidine 2 MG tablet Commonly known as:  ZANAFLEX Take 1 tablet (2 mg total) by mouth every 6 (six) hours as needed for muscle spasms.   triamcinolone cream 0.1 % Commonly known as:  KENALOG Apply 1 application topically 2 (two) times daily as needed (for skin irriation/ rash/lichenoid dermatitis).   vitamin B-12 1000  MCG tablet Commonly known as:  CYANOCOBALAMIN Take 1,000 mcg by mouth daily.   ZYRTEC ALLERGY 10 MG tablet Generic drug:  cetirizine Take 10 mg by mouth at bedtime.            Durable Medical Equipment  (From admission, onward)        Start     Ordered   09/15/17 1216  DME Walker rolling  Once    Question:  Patient needs a walker to  treat with the following condition  Answer:  Status post total left knee replacement   09/15/17 1215   09/15/17 1216  DME 3 n 1  Once     09/15/17 1215   09/15/17 1216  DME Bedside commode  Once    Question:  Patient needs a bedside commode to treat with the following condition  Answer:  Status post total left knee replacement   09/15/17 1215       Discharge Care Instructions  (From admission, onward)        Start     Ordered   09/17/17 0000  Weight bearing as tolerated    Question Answer Comment  Laterality left   Extremity Lower      09/17/17 1142      Diagnostic Studies: Dg Chest 2 View  Result Date: 09/05/2017 CLINICAL DATA:  Preop for left knee replacement, history of asthma and shortness of breath with exertion EXAM: CHEST  2 VIEW COMPARISON:  Chest x-ray of 07/09/2013 FINDINGS: No active infiltrate or effusion is seen. Mediastinal and hilar contours are unremarkable. The heart is within upper limits of normal in size. No acute bony abnormality is seen. IMPRESSION: No active cardiopulmonary disease. Electronically Signed   By: Dwyane DeePaul  Barry M.D.   On: 09/05/2017 16:26    Disposition:   Discharge Instructions    Call MD / Call 911   Complete by:  As directed    If you experience chest pain or shortness of breath, CALL 911 and be transported to the hospital emergency room.  If you develope a fever above 101 F, pus (white drainage) or increased drainage or redness at the wound, or calf pain, call your surgeon's office.   Diet general   Complete by:  As directed    Do not put a pillow under the knee. Place it under the heel.    Complete by:  As directed    Increase activity slowly as tolerated   Complete by:  As directed    Weight bearing as tolerated   Complete by:  As directed    Laterality:  left   Extremity:  Lower      Follow-up Information    Gean Birchwoodowan, Frank, MD In 2 weeks.   Specialty:  Orthopedic Surgery Contact information: [address] [place] [address] [phone]            Signed: Matthew FolksBETHUNE,Khamarion Bjelland G 09/17/2017, 11:43 AM

## 2017-09-17 NOTE — Progress Notes (Signed)
Contacted Sophia Suarez in regards to equipment needed for home. Arrangements are in process.

## 2017-09-17 NOTE — Progress Notes (Signed)
Written and verbal discharge instructions provided.  The patient and her daughter verbalizes understanding.  Discharged to home via wheel chair with her daughter.  [facility] per case Production designer, theatre/television/filmmanager.

## 2017-09-17 NOTE — Care Management (Signed)
3n1 [facility]  [facility]

## 2017-09-17 NOTE — Progress Notes (Signed)
Physical Therapy Treatment Patient Details Name: Sophia Junioratricia M Vanantwerp MRN: [id number] DOB: January 13, 1941 Today's Date: 09/17/2017    History of Present Illness Pt is a 77 y/o female s/p elective L TKA. PMH includes CKD, DM, HTN, and CVA.     PT Comments    Patient motivated to work with PT this AM. Good progression of gait mechanics and stair training. Will continue to follow acutely to maximize mobility prior to d/c.    Follow Up Recommendations  Follow surgeon's recommendation for DC plan and follow-up therapies;Supervision for mobility/OOB     Equipment Recommendations  3in1 (PT);Other (comment)(youth size RW)    Recommendations for Other Services OT consult     Precautions / Restrictions Precautions Precautions: Knee Restrictions Weight Bearing Restrictions: Yes LLE Weight Bearing: Weight bearing as tolerated    Mobility  Bed Mobility Overal bed mobility: Modified Independent Bed Mobility: Supine to Sit     Supine to sit: Supervision     General bed mobility comments: increased time and effort to come to EOB - used PT to pull up on   Transfers Overall transfer level: Needs assistance Equipment used: Rolling walker (2 wheeled) Transfers: Sit to/from Stand Sit to Stand: Min guard         General transfer comment: for safety and immediate standing balance - no LOB  Ambulation/Gait Ambulation/Gait assistance: Min guard Ambulation Distance (Feet): 120 Feet Assistive device: Rolling walker (2 wheeled) Gait Pattern/deviations: Step-to pattern;Decreased step length - right;Decreased stance time - right;Decreased weight shift to left;Antalgic Gait velocity: Decreased  Gait velocity interpretation: Below normal speed for age/gender General Gait Details: good awareness of safety with AD   Stairs Stairs: Yes   Stair Management: One rail Right;Step to pattern Number of Stairs: 2 General stair comments: cues for sequencing - B UE use on R handrail to pull up  on  Wheelchair Mobility    Modified Rankin (Stroke Patients Only)       Balance Overall balance assessment: Needs assistance Sitting-balance support: No upper extremity supported;Feet supported Sitting balance-Leahy Scale: Good     Standing balance support: Bilateral upper extremity supported;During functional activity Standing balance-Leahy Scale: Fair Standing balance comment: patient able to stand at sink without UE support                            Cognition Arousal/Alertness: Awake/alert Behavior During Therapy: WFL for tasks assessed/performed Overall Cognitive Status: Within Functional Limits for tasks assessed                                        Exercises      General Comments        Pertinent Vitals/Pain Pain Assessment: No/denies pain    Home Living                      Prior Function            PT Goals (current goals can now be found in the care plan section) Acute Rehab PT Goals Patient Stated Goal: to go home  PT Goal Formulation: With patient Time For Goal Achievement: 09/29/17 Potential to Achieve Goals: Good Progress towards PT goals: Progressing toward goals    Frequency    7X/week      PT Plan Current plan remains appropriate    Co-evaluation  AM-PAC PT "6 Clicks" Daily Activity  Outcome Measure  Difficulty turning over in bed (including adjusting bedclothes, sheets and blankets)?: A Little Difficulty moving from lying on back to sitting on the side of the bed? : A Little Difficulty sitting down on and standing up from a chair with arms (e.g., wheelchair, bedside commode, etc,.)?: Unable Help needed moving to and from a bed to chair (including a wheelchair)?: A Little Help needed walking in hospital room?: A Little Help needed climbing 3-5 steps with a railing? : A Little 6 Click Score: 16    End of Session Equipment Utilized During Treatment: Gait belt Activity  Tolerance: Patient tolerated treatment well Patient left: in chair;with call bell/phone within reach;with family/visitor present Nurse Communication: Mobility status PT Visit Diagnosis: Other abnormalities of gait and mobility (R26.89);Pain     Time: 1610-9604 PT Time Calculation (min) (ACUTE ONLY): 21 min  Charges:  $Gait Training: 8-22 mins                    G Codes:       Kipp Laurence, PT, DPT 09/17/17 9:22 AM

## 2017-09-18 ENCOUNTER — Encounter (HOSPITAL_COMMUNITY): Payer: Self-pay | Admitting: Orthopedic Surgery

## 2017-11-02 ENCOUNTER — Other Ambulatory Visit: Payer: Self-pay | Admitting: Cardiovascular Disease

## 2017-12-29 ENCOUNTER — Other Ambulatory Visit: Payer: Self-pay | Admitting: Otolaryngology

## 2017-12-29 DIAGNOSIS — K118 Other diseases of salivary glands: Secondary | ICD-10-CM

## 2018-01-17 ENCOUNTER — Ambulatory Visit
Admission: RE | Admit: 2018-01-17 | Discharge: 2018-01-17 | Disposition: A | Discharge status: Home / Self Care | Payer: Medicare Other | Source: Ambulatory Visit | Attending: Otolaryngology | Admitting: Otolaryngology

## 2018-01-17 DIAGNOSIS — K118 Other diseases of salivary glands: Secondary | ICD-10-CM

## 2018-01-17 MED ORDER — IOPAMIDOL (ISOVUE-300) INJECTION 61%
75.0000 mL | Freq: Once | INTRAVENOUS | Status: AC | PRN
  Administered 2018-01-17: 75 mL via INTRAVENOUS

## 2018-02-21 DIAGNOSIS — K113 Abscess of salivary gland: Secondary | ICD-10-CM | POA: Insufficient documentation

## 2018-04-12 ENCOUNTER — Telehealth: Payer: Self-pay | Admitting: Cardiovascular Disease

## 2018-04-12 NOTE — Telephone Encounter (Signed)
New Message:        [facility] Pre-operative Risk Assessment    Request for surgical clearance:  1. What type of surgery is being performed? R superficial Corticotomy with facial nerve dissection , also excision of facial skin mass   2. When is this surgery scheduled? 04/23/18  3. What type of clearance is required (medical clearance vs. Pharmacy clearance to hold med vs. Both)? Pharmacy   4. Are there any medications that need to be held prior to surgery and how long? Not Sure   5. Practice name and name of physician performing surgery? Dr. Velna Ochs [facility]   6. What is your office phone number [phone]   7.   What is your office fax number [phone]  8.   Anesthesia type (None, local, MAC, general) ? General   Sophia Suarez 04/12/2018, 4:00 PM  _________________________________________________________________   (provider comments below)

## 2018-04-13 NOTE — Telephone Encounter (Signed)
Request sent to hold Plavix for surgery- it appears the patient is on ASA and Plavix for carotid disease and h/o TIA. Her neurologist is Dr Orville Govern and I will ask Ms Mundo to ask him about holding this. I called the pt and left a message to call back.  Corine Shelter PA-C 04/13/2018 11:29 AM

## 2018-04-16 ENCOUNTER — Encounter (HOSPITAL_BASED_OUTPATIENT_CLINIC_OR_DEPARTMENT_OTHER): Payer: Self-pay | Admitting: *Deleted

## 2018-04-16 ENCOUNTER — Other Ambulatory Visit: Payer: Self-pay

## 2018-04-16 NOTE — Telephone Encounter (Signed)
See attached

## 2018-04-17 ENCOUNTER — Encounter (HOSPITAL_BASED_OUTPATIENT_CLINIC_OR_DEPARTMENT_OTHER)
Admission: RE | Admit: 2018-04-17 | Discharge: 2018-04-17 | Disposition: A | Discharge status: Home / Self Care | Payer: Medicare Other | Source: Ambulatory Visit | Attending: Otolaryngology | Admitting: Otolaryngology

## 2018-04-17 DIAGNOSIS — Z01812 Encounter for preprocedural laboratory examination: Secondary | ICD-10-CM | POA: Insufficient documentation

## 2018-04-17 LAB — BASIC METABOLIC PANEL
Anion gap: 11 (ref 5–15)
BUN: 22 mg/dL (ref 8–23)
CALCIUM: 9.6 mg/dL (ref 8.9–10.3)
CO2: 25 mmol/L (ref 22–32)
CREATININE: 0.85 mg/dL (ref 0.44–1.00)
Chloride: 103 mmol/L (ref 98–111)
GFR calc non Af Amer: >60 mL/min (ref >60)
Glucose, Bld: 144 mg/dL — ABNORMAL HIGH (ref 70–99)
Potassium: 3.5 mmol/L (ref 3.5–5.1)
Sodium: 139 mmol/L (ref 135–145)

## 2018-04-19 NOTE — H&P (Signed)
HPI:   Sophia Suarez is a 77 y.o. female who presents as a consult Patient.   Referring Provider: Hollice Espy, MD  Chief complaint: Facial mass.  HPI: About 2 weeks ago she developed sudden onset of a swollen tender growth on the right side of her face. She started on an antibiotic about 1 week ago and feels that it is a little bit less tender and has decreased in size a small amount.  PMH/Meds/All/SocHx/FamHx/ROS:   Past Medical History:  Diagnosis Date  . Abnormal heart rhythm  . Acid reflux  . Arthritis  . Asthma  . Diabetes mellitus (HCC)  . High cholesterol  . Osteoporosis   Past Surgical History:  Procedure Laterality Date  . APPENDECTOMY  . HYSTERECTOMY  . SMALL INTESTINE SURGERY   No family history of bleeding disorders, wound healing problems or difficulty with anesthesia.   Social History   Social History  . Marital status: Divorced  Spouse name: N/A  . Number of children: N/A  . Years of education: N/A   Occupational History  . Not on file.   Social History Main Topics  . Smoking status: Never Smoker  . Smokeless tobacco: Never Used  . Alcohol use Not on file  . Drug use: Unknown  . Sexual activity: Not on file   Other Topics Concern  . Not on file   Social History Narrative  . No narrative on file   Current Outpatient Prescriptions:  . alendronate (FOSAMAX) 70 MG tablet, TAKE 1 TABLET BY MOUTH WEEKLY, Disp: , Rfl: 4 . clopidogrel (PLAVIX) 75 mg tablet *ANTIPLATELET*, TAKE 1 TABLET BY MOUTH EVERY DAY, Disp: , Rfl: 0 . dorzolamide (TRUSOPT) 2 % ophthalmic solution, Apply 1 drop to eye., Disp: , Rfl:  . hydroCHLOROthiazide (MICROZIDE) 12.5 mg capsule, TAKE ONE CAPSULE EVERY DAY, Disp: , Rfl: 3 . omeprazole (PRILOSEC) 20 MG capsule, TAKE 1 CAPSULE TWICE A DAY APPROXIMATELY 30 MINUTES BEFORE BREAKFAST AND SUPPER, Disp: , Rfl: 4 . simvastatin (ZOCOR) 40 MG tablet, TAKE 1 TABLET BY MOUTH EVERY DAY IN THE EVENING, Disp: , Rfl: 0 . timolol  (BETIMOL) 0.5 % ophthalmic solution, Apply 1 drop to eye., Disp: , Rfl:  . triamcinolone (KENALOG) 0.1 % cream, Apply 1 application topically 2 (two) times daily as needed (for skin irriation/ rash/lichenoid dermatitis)., Disp: , Rfl:   A complete ROS was performed with pertinent positives/negatives noted in the HPI. The remainder of the ROS are negative.   Physical Exam:   Ht 1.473 m (4\' 10" )  Wt 68 kg (150 lb)  BMI 31.35 kg/m   General: Healthy and alert, in no distress, breathing easily. Normal affect. In a pleasant mood. Head: Normocephalic, atraumatic. No masses, or scars. Eyes: Pupils are equal, and reactive to light. Vision is grossly intact. No spontaneous or gaze nystagmus. Ears: Ear canals are clear. Tympanic membranes are intact, with normal landmarks and the middle ears are clear and healthy. Hearing: Grossly normal. Nose: Nasal cavities are clear with healthy mucosa, no polyps or exudate. Airways are patent. Face: No masses or scars, facial nerve function is symmetric. Oral Cavity: No mucosal abnormalities are noted. Tongue with normal mobility. Dentition appears healthy. Oropharynx: Tonsils are symmetric. There are no mucosal masses identified. Tongue base appears normal and healthy. Larynx/Hypopharynx: deferred Chest: Deferred Neck: There is a slightly tender 3 cm right inferior parotid mass, no cervical adenopathy, no thyroid nodules or enlargement. The remainder of the gland appears to be normal by palpation. Neuro: Cranial  nerves II-XII with normal function. Balance: Normal gate. Other findings: none.  Independent Review of Additional Tests or Records:  none  Procedures:  none  Impression & Plans:  Right parotid mass, with tenderness, and by history seems to have come up quickly. It is a little bit less tender and slightly smaller with antibiotics. By palpation, it seems to be more likely to be a neoplasm. Recommend CT imaging to further evaluate that. No  evidence of facial nerve involvement.

## 2018-04-22 NOTE — Anesthesia Preprocedure Evaluation (Addendum)
Anesthesia Evaluation  Patient identified by MRN, date of birth, ID band Patient awake    Reviewed: Allergy & Precautions, NPO status , Patient's Chart, lab work & pertinent test results  Airway Mallampati: I  TM Distance: >3 FB Neck ROM: Full    Dental no notable dental hx. (+) Teeth Intact, Dental Advisory Given   Pulmonary asthma ,    Pulmonary exam normal breath sounds clear to auscultation       Cardiovascular hypertension, + Peripheral Vascular Disease  Normal cardiovascular exam+ Valvular Problems/Murmurs AS  Rhythm:Regular Rate:Normal  TTE 08/2017 Normal LVF with EF 65-70%, G1DD, mild AS, mod LVH, mildly calcified AMVL tip, trivial TR, mild LAE.  Carotid artery duplex 08/2017 Right Carotid: Velocities in the right ICA are consistent with a 40-59% stenosis. The ECA appears >50% stenosed. Left Carotid: Velocities in the left ICA are consistent with a 1-39% stenosis. Vertebrals: Both vertebral arteries were patent with antegrade flow. Subclavians: Normal flow hemodynamics were seen in bilateral subclavian arteries.   Neuro/Psych CVA, No Residual Symptoms negative psych ROS   GI/Hepatic Neg liver ROS, GERD  Medicated,  Endo/Other  diabetes (diet controlled), Well Controlled, Type 2  Renal/GU Renal diseaseCKD  negative genitourinary   Musculoskeletal  (+) Arthritis , Osteoarthritis,    Abdominal   Peds  Hematology  (+) anemia ,   Anesthesia Other Findings Right parotid mass  On plavix, last dose 10 days ago  Reproductive/Obstetrics                            Anesthesia Physical Anesthesia Plan  ASA: III  Anesthesia Plan: General   Post-op Pain Management:    Induction: Intravenous  PONV Risk Score and Plan: 3 and Dexamethasone, Ondansetron and Midazolam  Airway Management Planned: Oral ETT  Additional Equipment:   Intra-op Plan:   Post-operative Plan: Extubation in  OR  Informed Consent: I have reviewed the patients History and Physical, chart, labs and discussed the procedure including the risks, benefits and alternatives for the proposed anesthesia with the patient or authorized representative who has indicated his/her understanding and acceptance.   Dental advisory given  Plan Discussed with: CRNA  Anesthesia Plan Comments:         Anesthesia Quick Evaluation

## 2018-04-23 ENCOUNTER — Ambulatory Visit (HOSPITAL_COMMUNITY)
Admission: RE | Admit: 2018-04-23 | Discharge: 2018-04-24 | Disposition: A | Discharge status: Home / Self Care | Payer: Medicare Other | Source: Ambulatory Visit | Attending: Otolaryngology | Admitting: Otolaryngology

## 2018-04-23 ENCOUNTER — Encounter (HOSPITAL_BASED_OUTPATIENT_CLINIC_OR_DEPARTMENT_OTHER): Payer: Self-pay | Admitting: *Deleted

## 2018-04-23 ENCOUNTER — Ambulatory Visit (HOSPITAL_BASED_OUTPATIENT_CLINIC_OR_DEPARTMENT_OTHER): Payer: Medicare Other | Admitting: Anesthesiology

## 2018-04-23 ENCOUNTER — Other Ambulatory Visit: Payer: Self-pay

## 2018-04-23 ENCOUNTER — Encounter (HOSPITAL_BASED_OUTPATIENT_CLINIC_OR_DEPARTMENT_OTHER)
Admission: RE | Disposition: A | Discharge status: Home / Self Care | Payer: Self-pay | Source: Ambulatory Visit | Attending: Otolaryngology

## 2018-04-23 DIAGNOSIS — E1151 Type 2 diabetes mellitus with diabetic peripheral angiopathy without gangrene: Secondary | ICD-10-CM | POA: Insufficient documentation

## 2018-04-23 DIAGNOSIS — Z79899 Other long term (current) drug therapy: Secondary | ICD-10-CM | POA: Insufficient documentation

## 2018-04-23 DIAGNOSIS — Z8673 Personal history of transient ischemic attack (TIA), and cerebral infarction without residual deficits: Secondary | ICD-10-CM | POA: Diagnosis not present

## 2018-04-23 DIAGNOSIS — Z7902 Long term (current) use of antithrombotics/antiplatelets: Secondary | ICD-10-CM | POA: Diagnosis not present

## 2018-04-23 DIAGNOSIS — K118 Other diseases of salivary glands: Secondary | ICD-10-CM | POA: Diagnosis not present

## 2018-04-23 DIAGNOSIS — I10 Essential (primary) hypertension: Secondary | ICD-10-CM | POA: Insufficient documentation

## 2018-04-23 DIAGNOSIS — K219 Gastro-esophageal reflux disease without esophagitis: Secondary | ICD-10-CM | POA: Diagnosis not present

## 2018-04-23 DIAGNOSIS — E7800 Pure hypercholesterolemia, unspecified: Secondary | ICD-10-CM | POA: Diagnosis not present

## 2018-04-23 DIAGNOSIS — M810 Age-related osteoporosis without current pathological fracture: Secondary | ICD-10-CM | POA: Insufficient documentation

## 2018-04-23 DIAGNOSIS — Z7983 Long term (current) use of bisphosphonates: Secondary | ICD-10-CM | POA: Insufficient documentation

## 2018-04-23 DIAGNOSIS — R220 Localized swelling, mass and lump, head: Secondary | ICD-10-CM | POA: Diagnosis present

## 2018-04-23 HISTORY — PX: PAROTIDECTOMY: SHX2163

## 2018-04-23 HISTORY — PX: MASS EXCISION: SHX2000

## 2018-04-23 SURGERY — EXCISION, PAROTID GLAND
Anesthesia: General | Site: Face | Laterality: Right

## 2018-04-23 MED ORDER — ONDANSETRON HCL 4 MG PO TABS: 4.0000 mg | ORAL_TABLET | ORAL | Status: DC | PRN

## 2018-04-23 MED ORDER — FENTANYL CITRATE (PF) 100 MCG/2ML IJ SOLN
INTRAMUSCULAR | Status: AC
  Filled 2018-04-23: qty 2

## 2018-04-23 MED ORDER — SUCCINYLCHOLINE CHLORIDE 200 MG/10ML IV SOSY
PREFILLED_SYRINGE | INTRAVENOUS | Status: AC
  Filled 2018-04-23: qty 10

## 2018-04-23 MED ORDER — ONDANSETRON HCL 4 MG/2ML IJ SOLN
INTRAMUSCULAR | Status: DC | PRN
  Administered 2018-04-23: 4 mg via INTRAVENOUS

## 2018-04-23 MED ORDER — TRIAMCINOLONE ACETONIDE 0.1 % EX CREA: 1.0000 "application " | TOPICAL_CREAM | Freq: Two times a day (BID) | CUTANEOUS | Status: DC | PRN

## 2018-04-23 MED ORDER — DEXAMETHASONE SODIUM PHOSPHATE 10 MG/ML IJ SOLN
INTRAMUSCULAR | Status: AC
  Filled 2018-04-23: qty 1

## 2018-04-23 MED ORDER — LIDOCAINE HCL (CARDIAC) PF 100 MG/5ML IV SOSY
PREFILLED_SYRINGE | INTRAVENOUS | Status: DC | PRN
  Administered 2018-04-23: 100 mg via INTRAVENOUS

## 2018-04-23 MED ORDER — HYDRALAZINE HCL 20 MG/ML IJ SOLN
5.0000 mg | Freq: Once | INTRAMUSCULAR | Status: AC
  Administered 2018-04-23: 5 mg via INTRAVENOUS

## 2018-04-23 MED ORDER — LACTATED RINGERS IV SOLN: INTRAVENOUS | Status: DC

## 2018-04-23 MED ORDER — PROPOFOL 10 MG/ML IV BOLUS
INTRAVENOUS | Status: AC
  Filled 2018-04-23: qty 20

## 2018-04-23 MED ORDER — DEXAMETHASONE SODIUM PHOSPHATE 4 MG/ML IJ SOLN
INTRAMUSCULAR | Status: DC | PRN
  Administered 2018-04-23: 10 mg via INTRAVENOUS

## 2018-04-23 MED ORDER — TIZANIDINE HCL 2 MG PO TABS: 2.0000 mg | ORAL_TABLET | Freq: Four times a day (QID) | ORAL | Status: DC | PRN

## 2018-04-23 MED ORDER — DIPHENHYDRAMINE HCL 25 MG PO CAPS: 25.0000 mg | ORAL_CAPSULE | Freq: Every day | ORAL | Status: DC

## 2018-04-23 MED ORDER — DORZOLAMIDE HCL 2 % OP SOLN
1.0000 [drp] | Freq: Two times a day (BID) | OPHTHALMIC | Status: DC
  Administered 2018-04-23 (×2): 1 [drp] via OPHTHALMIC

## 2018-04-23 MED ORDER — CALCIUM CARB-CHOLECALCIFEROL 600-500 MG-UNIT PO CAPS: ORAL_CAPSULE | Freq: Every day | ORAL | Status: DC

## 2018-04-23 MED ORDER — LORATADINE 10 MG PO TABS
10.0000 mg | ORAL_TABLET | Freq: Every day | ORAL | Status: DC
  Administered 2018-04-23: 10 mg via ORAL

## 2018-04-23 MED ORDER — SIMVASTATIN 40 MG PO TABS: 40.0000 mg | ORAL_TABLET | Freq: Every evening | ORAL | Status: DC

## 2018-04-23 MED ORDER — HYDROCHLOROTHIAZIDE 12.5 MG PO CAPS
12.5000 mg | ORAL_CAPSULE | Freq: Every day | ORAL | Status: DC
  Administered 2018-04-23: 12.5 mg via ORAL

## 2018-04-23 MED ORDER — ONDANSETRON HCL 4 MG/2ML IJ SOLN
INTRAMUSCULAR | Status: AC
  Filled 2018-04-23: qty 2

## 2018-04-23 MED ORDER — ADULT MULTIVITAMIN W/MINERALS CH: 1.0000 | ORAL_TABLET | Freq: Every day | ORAL | Status: DC

## 2018-04-23 MED ORDER — PROPOFOL 10 MG/ML IV BOLUS
INTRAVENOUS | Status: DC | PRN
  Administered 2018-04-23: 50 mg via INTRAVENOUS
  Administered 2018-04-23: 150 mg via INTRAVENOUS
  Administered 2018-04-23 (×3): 50 mg via INTRAVENOUS

## 2018-04-23 MED ORDER — SUCCINYLCHOLINE CHLORIDE 20 MG/ML IJ SOLN
INTRAMUSCULAR | Status: DC | PRN
  Administered 2018-04-23: 120 mg via INTRAVENOUS

## 2018-04-23 MED ORDER — BIOTIN 1000 MCG PO TABS: 2000.0000 ug | ORAL_TABLET | Freq: Every day | ORAL | Status: DC

## 2018-04-23 MED ORDER — SALINE SPRAY 0.65 % NA SOLN: 1.0000 | Freq: Four times a day (QID) | NASAL | Status: DC | PRN

## 2018-04-23 MED ORDER — LOSARTAN POTASSIUM 50 MG PO TABS
100.0000 mg | ORAL_TABLET | Freq: Every day | ORAL | Status: DC
  Administered 2018-04-23: 100 mg via ORAL

## 2018-04-23 MED ORDER — HYDRALAZINE HCL 10 MG PO TABS: 10.0000 mg | ORAL_TABLET | Freq: Four times a day (QID) | ORAL | Status: DC | PRN

## 2018-04-23 MED ORDER — DOCUSATE SODIUM 100 MG PO CAPS: 100.0000 mg | ORAL_CAPSULE | Freq: Every evening | ORAL | Status: DC | PRN

## 2018-04-23 MED ORDER — PANTOPRAZOLE SODIUM 40 MG PO TBEC
40.0000 mg | DELAYED_RELEASE_TABLET | Freq: Every day | ORAL | Status: DC
  Administered 2018-04-23: 40 mg via ORAL

## 2018-04-23 MED ORDER — ALENDRONATE SODIUM 70 MG PO TABS: 70.0000 mg | ORAL_TABLET | ORAL | Status: DC

## 2018-04-23 MED ORDER — LACTATED RINGERS IV SOLN
INTRAVENOUS | Status: DC
  Administered 2018-04-23 (×2): 10 mL/h via INTRAVENOUS

## 2018-04-23 MED ORDER — MIDAZOLAM HCL 2 MG/2ML IJ SOLN: 1.0000 mg | INTRAMUSCULAR | Status: DC | PRN

## 2018-04-23 MED ORDER — HYDRALAZINE HCL 20 MG/ML IJ SOLN
10.0000 mg | Freq: Once | INTRAMUSCULAR | Status: AC
  Administered 2018-04-23: 10 mg via INTRAVENOUS

## 2018-04-23 MED ORDER — VITAMIN B-12 1000 MCG PO TABS: 1000.0000 ug | ORAL_TABLET | Freq: Every day | ORAL | Status: DC

## 2018-04-23 MED ORDER — SCOPOLAMINE 1 MG/3DAYS TD PT72: 1.0000 | MEDICATED_PATCH | Freq: Once | TRANSDERMAL | Status: DC | PRN

## 2018-04-23 MED ORDER — LIDOCAINE 2% (20 MG/ML) 5 ML SYRINGE
INTRAMUSCULAR | Status: AC
  Filled 2018-04-23: qty 5

## 2018-04-23 MED ORDER — HYDROCODONE-ACETAMINOPHEN 5-325 MG PO TABS
1.0000 | ORAL_TABLET | ORAL | Status: DC | PRN
  Administered 2018-04-23 (×2): 1 via ORAL
  Filled 2018-04-23 (×2): qty 1

## 2018-04-23 MED ORDER — ASPIRIN EC 81 MG PO TBEC
81.0000 mg | DELAYED_RELEASE_TABLET | Freq: Every day | ORAL | Status: DC
  Administered 2018-04-23: 81 mg via ORAL

## 2018-04-23 MED ORDER — MOVE FREE JOINT HEALTH ADVANCE PO TABS: 2.0000 | ORAL_TABLET | Freq: Every day | ORAL | Status: DC

## 2018-04-23 MED ORDER — FENTANYL CITRATE (PF) 100 MCG/2ML IJ SOLN
25.0000 ug | INTRAMUSCULAR | Status: DC | PRN
  Administered 2018-04-23: 50 ug via INTRAVENOUS

## 2018-04-23 MED ORDER — ONDANSETRON HCL 4 MG/2ML IJ SOLN: 4.0000 mg | INTRAMUSCULAR | Status: DC | PRN

## 2018-04-23 MED ORDER — HYDRALAZINE HCL 20 MG/ML IJ SOLN
INTRAMUSCULAR | Status: AC
  Filled 2018-04-23: qty 1

## 2018-04-23 MED ORDER — FENTANYL CITRATE (PF) 100 MCG/2ML IJ SOLN
50.0000 ug | INTRAMUSCULAR | Status: AC | PRN
  Administered 2018-04-23 (×2): 50 ug via INTRAVENOUS
  Administered 2018-04-23: 25 ug via INTRAVENOUS

## 2018-04-23 SURGICAL SUPPLY — 84 items
APPLICATOR COTTON TIP 6 STRL (MISCELLANEOUS) ×1 IMPLANT
APPLICATOR COTTON TIP 6IN STRL (MISCELLANEOUS) ×3
ATTRACTOMAT 16X20 MAGNETIC DRP (DRAPES) ×3 IMPLANT
BENZOIN TINCTURE PRP APPL 2/3 (GAUZE/BANDAGES/DRESSINGS) IMPLANT
BLADE SURG 15 STRL LF DISP TIS (BLADE) ×1 IMPLANT
BLADE SURG 15 STRL SS (BLADE) ×2
CANISTER SUCT 1200ML W/VALVE (MISCELLANEOUS) ×3 IMPLANT
CLEANER CAUTERY TIP 5X5 PAD (MISCELLANEOUS) ×1 IMPLANT
CLIP VESOCCLUDE MED 6/CT (CLIP) IMPLANT
CLIP VESOCCLUDE SM WIDE 6/CT (CLIP) IMPLANT
CLOSURE WOUND 1/2 X4 (GAUZE/BANDAGES/DRESSINGS)
CORD BIPOLAR FORCEPS 12FT (ELECTRODE) ×3 IMPLANT
COVER BACK TABLE 60X90IN (DRAPES) ×3 IMPLANT
COVER MAYO STAND STRL (DRAPES) ×3 IMPLANT
COVER WAND RF STERILE (DRAPES) IMPLANT
DERMABOND ADVANCED (GAUZE/BANDAGES/DRESSINGS) ×2
DERMABOND ADVANCED .7 DNX12 (GAUZE/BANDAGES/DRESSINGS) ×1 IMPLANT
DRAIN JACKSON RD 7FR 3/32 (WOUND CARE) IMPLANT
DRAIN PENROSE 1/4X12 LTX STRL (WOUND CARE) IMPLANT
DRAIN TLS ROUND 10FR (DRAIN) ×3 IMPLANT
DRAPE INCISE 23X17 IOBAN STRL (DRAPES) ×2
DRAPE INCISE IOBAN 23X17 STRL (DRAPES) ×1 IMPLANT
DRAPE U-SHAPE 76X120 STRL (DRAPES) ×3 IMPLANT
ELECT COATED BLADE 2.86 ST (ELECTRODE) ×3 IMPLANT
ELECT REM PT RETURN 9FT ADLT (ELECTROSURGICAL) ×3
ELECTRODE REM PT RTRN 9FT ADLT (ELECTROSURGICAL) ×1 IMPLANT
EVACUATOR SILICONE 100CC (DRAIN) ×3 IMPLANT
FORCEPS BIPOLAR SPETZLER 8 1.0 (NEUROSURGERY SUPPLIES) ×3 IMPLANT
GAUZE 4X4 16PLY RFD (DISPOSABLE) IMPLANT
GAUZE SPONGE 4X4 12PLY STRL LF (GAUZE/BANDAGES/DRESSINGS) IMPLANT
GLOVE BIO SURGEON STRL SZ 6.5 (GLOVE) IMPLANT
GLOVE BIO SURGEON STRL SZ7 (GLOVE) IMPLANT
GLOVE BIO SURGEON STRL SZ7.5 (GLOVE) IMPLANT
GLOVE BIO SURGEONS STRL SZ 6.5 (GLOVE)
GLOVE BIOGEL PI IND STRL 8 (GLOVE) ×1 IMPLANT
GLOVE BIOGEL PI INDICATOR 8 (GLOVE) ×2
GLOVE ECLIPSE 7.5 STRL STRAW (GLOVE) IMPLANT
GLOVE ECLIPSE 8.0 STRL XLNG CF (GLOVE) IMPLANT
GLOVE SS BIOGEL STRL SZ 7.5 (GLOVE) IMPLANT
GLOVE SUPERSENSE BIOGEL SZ 7.5 (GLOVE)
GLOVE SURG SS PI 6.5 STRL IVOR (GLOVE) ×3 IMPLANT
GLOVE SURG SS PI 7.0 STRL IVOR (GLOVE) ×3 IMPLANT
GLOVE SURG SS PI 7.5 STRL IVOR (GLOVE) ×3 IMPLANT
GOWN STRL REUS W/ TWL LRG LVL3 (GOWN DISPOSABLE) ×2 IMPLANT
GOWN STRL REUS W/ TWL XL LVL3 (GOWN DISPOSABLE) ×2 IMPLANT
GOWN STRL REUS W/TWL LRG LVL3 (GOWN DISPOSABLE) ×4
GOWN STRL REUS W/TWL XL LVL3 (GOWN DISPOSABLE) ×4
LOCATOR NERVE 3 VOLT (DISPOSABLE) IMPLANT
NEEDLE HYPO 25X1 1.5 SAFETY (NEEDLE) ×3 IMPLANT
NEEDLE PRECISIONGLIDE 27X1.5 (NEEDLE) ×3 IMPLANT
NS IRRIG 1000ML POUR BTL (IV SOLUTION) ×3 IMPLANT
PACK BASIN DAY SURGERY FS (CUSTOM PROCEDURE TRAY) ×3 IMPLANT
PAD CLEANER CAUTERY TIP 5X5 (MISCELLANEOUS) ×2
PENCIL FOOT CONTROL (ELECTRODE) ×3 IMPLANT
PIN SAFETY STERILE (MISCELLANEOUS) IMPLANT
RUBBERBAND STERILE (MISCELLANEOUS) IMPLANT
SHEARS HARMONIC 9CM CVD (BLADE) ×3 IMPLANT
SHEET MEDIUM DRAPE 40X70 STRL (DRAPES) IMPLANT
SLEEVE SCD COMPRESS KNEE MED (MISCELLANEOUS) ×3 IMPLANT
SPONGE GAUZE 2X2 8PLY STER LF (GAUZE/BANDAGES/DRESSINGS)
SPONGE GAUZE 2X2 8PLY STRL LF (GAUZE/BANDAGES/DRESSINGS) IMPLANT
STAPLER VISISTAT 35W (STAPLE) IMPLANT
STRIP CLOSURE SKIN 1/2X4 (GAUZE/BANDAGES/DRESSINGS) IMPLANT
SUCTION FRAZIER HANDLE 10FR (MISCELLANEOUS) ×2
SUCTION TUBE FRAZIER 10FR DISP (MISCELLANEOUS) ×1 IMPLANT
SUT CHROMIC 3 0 PS 2 (SUTURE) ×3 IMPLANT
SUT CHROMIC 3 0 SH 27 (SUTURE) ×3 IMPLANT
SUT CHROMIC 4 0 P 3 18 (SUTURE) IMPLANT
SUT ETHILON 3 0 PS 1 (SUTURE) ×6 IMPLANT
SUT ETHILON 4 0 PS 2 18 (SUTURE) IMPLANT
SUT ETHILON 5 0 P 3 18 (SUTURE)
SUT NYLON ETHILON 5-0 P-3 1X18 (SUTURE) IMPLANT
SUT PLAIN 5 0 P 3 18 (SUTURE) IMPLANT
SUT SILK 3 0 PS 1 (SUTURE) ×3 IMPLANT
SUT SILK 3 0 SH CR/8 (SUTURE) IMPLANT
SUT SILK 4 0 TIES 17X18 (SUTURE) ×3 IMPLANT
SUT VICRYL 3-0 CR8 SH (SUTURE) ×3 IMPLANT
SUT VICRYL 4-0 PS2 18IN ABS (SUTURE) IMPLANT
SYR BULB 3OZ (MISCELLANEOUS) ×3 IMPLANT
SYR CONTROL 10ML LL (SYRINGE) ×3 IMPLANT
TOWEL GREEN STERILE FF (TOWEL DISPOSABLE) ×6 IMPLANT
TRAY DSU PREP LF (CUSTOM PROCEDURE TRAY) ×3 IMPLANT
TUBE CONNECTING 20'X1/4 (TUBING) ×1
TUBE CONNECTING 20X1/4 (TUBING) ×2 IMPLANT

## 2018-04-23 NOTE — Progress Notes (Signed)
ENT Post Operative Note  Subjective: No nausea, no vomiting No difficulty voiding Pain well controlled Fluids KVO currently RN reports BP still elevated (patient delayed taking home BP meds this morning due to surgery)  Vitals:   04/23/18 1430 04/23/18 1615  BP: (!) 170/80 (!) 170/75  Pulse: 74 72  Resp: 16 16  Temp: (!) 97.2 F (36.2 C)   SpO2: 95% 99%     OBJECTIVE  Gen: alert, cooperative, appropriate Head/ENT: EOMI, neck supple, mucus membranes moist and pink, conjunctiva clear Incisions c/d/i Right facial movement decreased- HB II/VI Drain with serosanguinous drainage.  Respiratory: Voice without dysphonia. non-labored breathing, no accessory muscle use, normal HR, good O2 saturations    ASSESS/ PLAN  Sophia Suarez is a 77 y.o. female who is POD 0 from right parotidectomy.  -pain control -MIVF- will saline lock when tolerating PO intake -PRN hydralazine -KVO fluids -Ambulate ad lib  Graylin Shiver, MD

## 2018-04-23 NOTE — Anesthesia Procedure Notes (Signed)
Procedure Name: Intubation Date/Time: 04/23/2018 10:34 AM Performed by: Lyndee Leo, CRNA Pre-anesthesia Checklist: Patient identified, Emergency Drugs available, Suction available and Patient being monitored Patient Re-evaluated:Patient Re-evaluated prior to induction Oxygen Delivery Method: Circle system utilized Preoxygenation: Pre-oxygenation with 100% oxygen Induction Type: IV induction Ventilation: Mask ventilation without difficulty Laryngoscope Size: Mac and 3 Grade View: Grade II Tube type: Oral Rae Tube size: 7.0 mm Number of attempts: 1 Airway Equipment and Method: Stylet and Oral airway Placement Confirmation: ETT inserted through vocal cords under direct vision,  positive ETCO2 and breath sounds checked- equal and bilateral Secured at: 21 cm Tube secured with: Tape Dental Injury: Teeth and Oropharynx as per pre-operative assessment

## 2018-04-23 NOTE — Transfer of Care (Signed)
Immediate Anesthesia Transfer of Care Note  Patient: Sophia Suarez  Procedure(s) Performed: Superficial Parotidectomy with nerve dissection (Right Face) EXCISION OF SKIN MASS WITH PRIMARY CLOSURE (Right Face)  Patient Location: PACU  Anesthesia Type:General  Level of Consciousness: awake, sedated and patient cooperative  Airway & Oxygen Therapy: Patient Spontanous Breathing and Patient connected to face mask oxygen  Post-op Assessment: Report given to RN and Post -op Vital signs reviewed and stable  Post vital signs: Reviewed and stable  Last Vitals:  Vitals Value Taken Time  BP 192/85 04/23/2018 12:07 PM  Temp    Pulse 73 04/23/2018 12:08 PM  Resp 14 04/23/2018 12:08 PM  SpO2 100 % 04/23/2018 12:08 PM  Vitals shown include unvalidated device data.  Last Pain:  Vitals:   04/23/18 0804  TempSrc: Oral  PainSc: 3       Patients Stated Pain Goal: 3 (04/23/18 0804)  Complications: No apparent anesthesia complications

## 2018-04-23 NOTE — Interval H&P Note (Signed)
History and Physical Interval Note:  04/23/2018 9:36 AM  Sophia Suarez  has presented today for surgery, with the diagnosis of Parotid mass of the right  The various methods of treatment have been discussed with the patient and family. After consideration of risks, benefits and other options for treatment, the patient has consented to  Procedure(s): Superficial Parotidectomy with nerve dissection (Right) EXCISION OF SKIN MASS WITH PRIMARY CLOSURE (Right) as a surgical intervention .  The patient's history has been reviewed, patient examined, no change in status, stable for surgery.  I have reviewed the patient's chart and labs.  Questions were answered to the patient's satisfaction.     Serena Colonel

## 2018-04-23 NOTE — Op Note (Signed)
OPERATIVE REPORT  DATE OF SURGERY: 04/23/2018  PATIENT:  Sophia Suarez,  77 y.o. female  PRE-OPERATIVE DIAGNOSIS:  Parotid mass of the right  POST-OPERATIVE DIAGNOSIS:  Parotid mass of the right  PROCEDURE:  Procedure(s): Superficial Parotidectomy with nerve dissection EXCISION OF SKIN MASS WITH PRIMARY CLOSURE  SURGEON:  Susy Frizzle, MD  ASSISTANTS: Aquilla Hacker PA  ANESTHESIA:   General   EBL: 50 ml  DRAINS: 7 French round JP  LOCAL MEDICATIONS USED:  None  SPECIMEN: Right superficial parotid with overlying skin lesion   COUNTS:  Correct  PROCEDURE DETAILS: The patient was taken to the operating room and placed on the operating table in the supine position. Following induction of general endotracheal anesthesia, the right side of the face was prepped and draped in a standard fashion. A preauricular incision was outlined with a marking pen with extension down around the skin lesion approximately at the angle of the mandible.  This part of the incision was more anterior than typical due to this abnormal skin.  Electrocautery was used to incise the skin and subcutaneous tissue. The lateral branch of the greater auricular nerve was preserved. The parotid gland was dissected forward off of the upper sternocleidomastoid muscle and the digastric muscle was exposed posteriorly. The gland was also dissected off of the external auditory canal. Careful dissection superior to the digastric muscle and medial to the tympanomastoid suture line revealed the main trunk of the facial nerve. Using a McCabe dissector the main trunk was dissected out towards the pes anserinus. The upper and lower divisions were then dissected identifying all branches.  The harmonic scalpel was used to divide the parotid tissue. Bipolar cautery was used as needed for completion of hemostasis. The abnormal parotid tissue was identified in the lateral part of the gland. The glandular tissue with the  accompanying tumor and skin was dissected free and removed, sent for pathologic evaluation. The wound was irrigated with saline and hemostasis was completed as needed.  The drain was exited through a separate stab incision and secured in place with nylon suture. A running subcuticular chromic closure was accomplished and Dermabond was used on the skin. The drain was charged. She was awakened, extubated and transferred to recovery in stable condition.    PATIENT DISPOSITION:  To PACU, stable

## 2018-04-24 ENCOUNTER — Encounter (HOSPITAL_BASED_OUTPATIENT_CLINIC_OR_DEPARTMENT_OTHER): Payer: Self-pay | Admitting: Otolaryngology

## 2018-04-24 NOTE — Discharge Instructions (Signed)
Come to the office later today between 4 and 5 for drain removal.

## 2018-04-24 NOTE — Discharge Summary (Signed)
Physician Discharge Summary  Patient ID: Sophia Suarez MRN: [id number] DOB/AGE: 1940-10-02 77 y.o.  Admit date: 04/23/2018 Discharge date: 04/24/2018  Admission Diagnoses:parotid mass/infection  Discharge Diagnoses:  Active Problems:   Parotid mass   Discharged Condition: good  Hospital Course: no complications  Consults: none  Significant Diagnostic Studies: none  Treatments: surgery: parotidectomy  Discharge Exam: Blood pressure (!) 150/76, pulse 76, temperature 97.9 F (36.6 C), resp. rate 18, height 4\' 10"  (1.473 m), weight 71.8 kg, SpO2 100 %. PHYSICAL EXAM: Incision excellent. FN with some weakness, more in the upper division but not complete.JP in place.  Disposition: Discharge disposition: 01-Home or Self Care       Discharge Instructions    Diet - low sodium heart healthy   Complete by:  As directed    Increase activity slowly   Complete by:  As directed      Allergies as of 04/24/2018      Reactions   Clindamycin/lincomycin Anaphylaxis   Septra [sulfamethoxazole-trimethoprim] Anaphylaxis   Sulfa Antibiotics Anaphylaxis   Melatonin Other (See Comments), Hypertension   Over active    Horse-derived Products Itching   Latex Hives   Mold Extract [trichophyton] Other (See Comments)   Congestion.   Pollen Extract Other (See Comments)   Congestion.      Medication List    TAKE these medications   alendronate 70 MG tablet Commonly known as:  FOSAMAX Take 70 mg by mouth every Tuesday. Take with a full glass of water on an empty stomach.   aspirin EC 81 MG tablet Take 81 mg by mouth at bedtime.   Biotin 1000 MCG tablet Take 2,000 mcg by mouth daily.   brimonidine 0.2 % ophthalmic solution Commonly known as:  ALPHAGAN Place 1 drop into both eyes 2 (two) times daily.   CALCIUM PLUS VITAMIN D3 PO Take 1 tablet by mouth daily.   clopidogrel 75 MG tablet Commonly known as:  PLAVIX TAKE 1 TABLET (75 MG TOTAL) BY MOUTH DAILY.    diphenhydrAMINE 25 mg capsule Commonly known as:  BENADRYL Take 25 mg by mouth daily.   docusate sodium 100 MG capsule Commonly known as:  COLACE Take 100 mg by mouth at bedtime as needed for mild constipation.   dorzolamide 2 % ophthalmic solution Commonly known as:  TRUSOPT Place 1 drop into both eyes 2 (two) times daily.   Fish Oil 1000 MG Caps Take 2,000 mg by mouth daily.   hydrochlorothiazide 12.5 MG capsule Commonly known as:  MICROZIDE TAKE ONE CAPSULE EVERY DAY   losartan 100 MG tablet Commonly known as:  COZAAR Take 100 mg by mouth daily.   MOVE FREE JOINT HEALTH ADVANCE Tabs Take 2 tablets by mouth daily.   multivitamin with minerals Tabs tablet Take 1 tablet by mouth daily. Centrum Silver   omeprazole 20 MG capsule Commonly known as:  PRILOSEC Take 20 mg by mouth 2 (two) times daily.   oxyCODONE-acetaminophen 5-325 MG tablet Commonly known as:  PERCOCET/ROXICET Take 1 tablet by mouth every 4 (four) hours as needed for severe pain.   PROBIOTIC DAILY PO Take 1 capsule by mouth daily.   QUNOL ULTRA COQ10 PO Take 1 capsule by mouth daily.   simvastatin 40 MG tablet Commonly known as:  ZOCOR Take 40 mg by mouth every evening.   sodium chloride 0.65 % Soln nasal spray Commonly known as:  OCEAN Place 1 spray into both nostrils 4 (four) times daily as needed for congestion.   tiZANidine 2  MG tablet Commonly known as:  ZANAFLEX Take 1 tablet (2 mg total) by mouth every 6 (six) hours as needed for muscle spasms.   triamcinolone cream 0.1 % Commonly known as:  KENALOG Apply 1 application topically 2 (two) times daily as needed (for skin irriation/ rash/lichenoid dermatitis).   vitamin B-12 1000 MCG tablet Commonly known as:  CYANOCOBALAMIN Take 1,000 mcg by mouth daily.   ZYRTEC ALLERGY 10 MG tablet Generic drug:  cetirizine Take 10 mg by mouth at bedtime.      Follow-up Information    Serena Colonel, MD Follow up on 04/24/2018.   Specialty:   Otolaryngology Contact information: [address] [place] [address] [phone]           Signed: Serena Colonel 04/24/2018, 8:47 AM

## 2018-04-24 NOTE — Anesthesia Postprocedure Evaluation (Signed)
Anesthesia Post Note  Patient: Sophia Suarez  Procedure(s) Performed: Superficial Parotidectomy with nerve dissection (Right Face) EXCISION OF SKIN MASS WITH PRIMARY CLOSURE (Right Face)     Patient location during evaluation: PACU Anesthesia Type: General Level of consciousness: awake and alert Pain management: pain level controlled Vital Signs Assessment: post-procedure vital signs reviewed and stable Respiratory status: spontaneous breathing, nonlabored ventilation, respiratory function stable and patient connected to nasal cannula oxygen Cardiovascular status: blood pressure returned to baseline and stable Postop Assessment: no apparent nausea or vomiting Anesthetic complications: no    Last Vitals:  Vitals:   04/23/18 2330 04/24/18 0030  BP:    Pulse:    Resp:    Temp:    SpO2: 96% 95%    Last Pain:  Vitals:   04/24/18 0030  TempSrc:   PainSc: Asleep                 Chelsey L Woodrum

## 2018-07-23 ENCOUNTER — Ambulatory Visit: Payer: Medicare Other

## 2018-07-23 ENCOUNTER — Ambulatory Visit: Payer: Medicare Other | Admitting: Podiatry

## 2018-07-23 ENCOUNTER — Encounter: Payer: Self-pay | Admitting: Podiatry

## 2018-07-23 VITALS — BP 174/82 | HR 63 | Resp 16

## 2018-07-23 DIAGNOSIS — B351 Tinea unguium: Secondary | ICD-10-CM | POA: Diagnosis not present

## 2018-07-23 DIAGNOSIS — M79674 Pain in right toe(s): Secondary | ICD-10-CM

## 2018-07-23 DIAGNOSIS — D689 Coagulation defect, unspecified: Secondary | ICD-10-CM

## 2018-07-23 DIAGNOSIS — M79675 Pain in left toe(s): Secondary | ICD-10-CM | POA: Diagnosis not present

## 2018-07-23 DIAGNOSIS — M79672 Pain in left foot: Principal | ICD-10-CM

## 2018-07-23 DIAGNOSIS — R2681 Unsteadiness on feet: Secondary | ICD-10-CM | POA: Diagnosis not present

## 2018-07-23 DIAGNOSIS — L84 Corns and callosities: Secondary | ICD-10-CM

## 2018-07-23 DIAGNOSIS — M79671 Pain in right foot: Secondary | ICD-10-CM

## 2018-07-23 NOTE — Progress Notes (Signed)
   Subjective:    Patient ID: Sophia Suarez, female    DOB: 11-07-1940, 78 y.o.   MRN: [id number]  HPI    Review of Systems  All other systems reviewed and are negative.      Objective:   Physical Exam        Assessment & Plan:

## 2018-07-23 NOTE — Patient Instructions (Signed)
Diabetes Mellitus and Foot Care  Foot care is an important part of your health, especially when you have diabetes. Diabetes may cause you to have problems because of poor blood flow (circulation) to your feet and legs, which can cause your skin to:   Become thinner and drier.   Break more easily.   Heal more slowly.   Peel and crack.  You may also have nerve damage (neuropathy) in your legs and feet, causing decreased feeling in them. This means that you may not notice minor injuries to your feet that could lead to more serious problems. Noticing and addressing any potential problems early is the best way to prevent future foot problems.  How to care for your feet  Foot hygiene   Wash your feet daily with warm water and mild soap. Do not use hot water. Then, pat your feet and the areas between your toes until they are completely dry. Do not soak your feet as this can dry your skin.   Trim your toenails straight across. Do not dig under them or around the cuticle. File the edges of your nails with an emery board or nail file.   Apply a moisturizing lotion or petroleum jelly to the skin on your feet and to dry, brittle toenails. Use lotion that does not contain alcohol and is unscented. Do not apply lotion between your toes.  Shoes and socks   Wear clean socks or stockings every day. Make sure they are not too tight. Do not wear knee-high stockings since they may decrease blood flow to your legs.   Wear shoes that fit properly and have enough cushioning. Always look in your shoes before you put them on to be sure there are no objects inside.   To break in new shoes, wear them for just a few hours a day. This prevents injuries on your feet.  Wounds, scrapes, corns, and calluses   Check your feet daily for blisters, cuts, bruises, sores, and redness. If you cannot see the bottom of your feet, use a mirror or ask someone for help.   Do not cut corns or calluses or try to remove them with medicine.   If you  find a minor scrape, cut, or break in the skin on your feet, keep it and the skin around it clean and dry. You may clean these areas with mild soap and water. Do not clean the area with peroxide, alcohol, or iodine.   If you have a wound, scrape, corn, or callus on your foot, look at it several times a day to make sure it is healing and not infected. Check for:  ? Redness, swelling, or pain.  ? Fluid or blood.  ? Warmth.  ? Pus or a bad smell.  General instructions   Do not cross your legs. This may decrease blood flow to your feet.   Do not use heating pads or hot water bottles on your feet. They may burn your skin. If you have lost feeling in your feet or legs, you may not know this is happening until it is too late.   Protect your feet from hot and cold by wearing shoes, such as at the beach or on hot pavement.   Schedule a complete foot exam at least once a year (annually) or more often if you have foot problems. If you have foot problems, report any cuts, sores, or bruises to your health care provider immediately.  Contact a health care provider if:     You have a medical condition that increases your risk of infection and you have any cuts, sores, or bruises on your feet.   You have an injury that is not healing.   You have redness on your legs or feet.   You feel burning or tingling in your legs or feet.   You have pain or cramps in your legs and feet.   Your legs or feet are numb.   Your feet always feel cold.   You have pain around a toenail.  Get help right away if:   You have a wound, scrape, corn, or callus on your foot and:  ? You have pain, swelling, or redness that gets worse.  ? You have fluid or blood coming from the wound, scrape, corn, or callus.  ? Your wound, scrape, corn, or callus feels warm to the touch.  ? You have pus or a bad smell coming from the wound, scrape, corn, or callus.  ? You have a fever.  ? You have a red line going up your leg.  Summary   Check your feet every day  for cuts, sores, red spots, swelling, and blisters.   Moisturize feet and legs daily.   Wear shoes that fit properly and have enough cushioning.   If you have foot problems, report any cuts, sores, or bruises to your health care provider immediately.   Schedule a complete foot exam at least once a year (annually) or more often if you have foot problems.  This information is not intended to replace advice given to you by your health care provider. Make sure you discuss any questions you have with your health care provider.  Document Released: 06/24/2000 Document Revised: 08/09/2017 Document Reviewed: 07/29/2016  [employer] Interactive Patient Education  2019 [employer]

## 2018-07-30 NOTE — Progress Notes (Signed)
Subjective:   Patient ID: Sophia Suarez, female   DOB: 78 y.o.   MRN: [id number]   HPI Patient presents with chronic nail disease 1-5 both feet and lesions underneath the first metatarsal of both feet that are thick and she cannot cut.  Patient is also on blood thinner and does have diabetes that is under good control currently and states that she is here for evaluation.  Patient does not smoke likes to be active   Review of Systems  All other systems reviewed and are negative.       Objective:  Physical Exam Vitals signs and nursing note reviewed.  Constitutional:      Appearance: She is well-developed.  Pulmonary:     Effort: Pulmonary effort is normal.  Musculoskeletal: Normal range of motion.  Skin:    General: Skin is warm.  Neurological:     Mental Status: She is alert.     Neurovascular status was found to be intact muscle strength was adequate range of motion within normal limits.  Patient is found to have nail disease 1-5 both feet that are thick yellow brittle and that are painful when pressed and has plantar callus sub-first metatarsal right that is painful upon palpation.  Patient is noted to have good digital perfusion and is well oriented x3 with no other pathology noted     Assessment:  Mycotic nail infection 1-5 both feet with pain with lesion sub-first metatarsal bilateral with at risk diabetes and on blood thinner     Plan:  H&P conditions reviewed at great length.  At this point debridement of all nailbeds accomplished debridement of lesions accomplished with no iatrogenic bleeding and diabetic foot education given to patient.  Patient will be checked back for regular visit in approximately 3 months or earlier if needed  X-rays reviewed concerning her foot pain indicating mild arthritis with no indications of stress fracture or foot deformity

## 2018-08-13 ENCOUNTER — Encounter: Payer: Self-pay | Admitting: Cardiovascular Disease

## 2018-09-20 ENCOUNTER — Ambulatory Visit (HOSPITAL_COMMUNITY): Payer: Medicare Other | Attending: Cardiology

## 2018-09-20 ENCOUNTER — Other Ambulatory Visit: Payer: Self-pay

## 2018-09-20 DIAGNOSIS — I350 Nonrheumatic aortic (valve) stenosis: Secondary | ICD-10-CM | POA: Diagnosis present

## 2018-10-22 ENCOUNTER — Ambulatory Visit: Payer: Medicare Other | Admitting: Podiatry

## 2018-10-23 ENCOUNTER — Ambulatory Visit: Payer: Medicare Other | Admitting: Podiatry

## 2018-10-25 ENCOUNTER — Ambulatory Visit: Payer: Medicare Other | Admitting: Cardiovascular Disease

## 2018-11-01 ENCOUNTER — Other Ambulatory Visit: Payer: Self-pay | Admitting: Cardiovascular Disease

## 2018-11-30 ENCOUNTER — Other Ambulatory Visit: Payer: Self-pay

## 2018-11-30 ENCOUNTER — Ambulatory Visit: Payer: Medicare Other | Admitting: Podiatry

## 2018-11-30 ENCOUNTER — Encounter: Payer: Self-pay | Admitting: Podiatry

## 2018-11-30 ENCOUNTER — Ambulatory Visit (INDEPENDENT_AMBULATORY_CARE_PROVIDER_SITE_OTHER): Payer: Medicare Other

## 2018-11-30 VITALS — Temp 97.9°F

## 2018-11-30 DIAGNOSIS — M79674 Pain in right toe(s): Secondary | ICD-10-CM

## 2018-11-30 DIAGNOSIS — M7752 Other enthesopathy of left foot: Secondary | ICD-10-CM

## 2018-11-30 DIAGNOSIS — E119 Type 2 diabetes mellitus without complications: Secondary | ICD-10-CM

## 2018-11-30 DIAGNOSIS — M79675 Pain in left toe(s): Secondary | ICD-10-CM | POA: Diagnosis not present

## 2018-11-30 DIAGNOSIS — M76822 Posterior tibial tendinitis, left leg: Secondary | ICD-10-CM

## 2018-11-30 DIAGNOSIS — L84 Corns and callosities: Secondary | ICD-10-CM

## 2018-11-30 DIAGNOSIS — B351 Tinea unguium: Secondary | ICD-10-CM | POA: Diagnosis not present

## 2018-11-30 NOTE — Patient Instructions (Signed)
Onychomycosis/Fungal Toenails  WHAT IS IT? An infection that lies within the keratin of your nail plate that is caused by a fungus.  WHY ME? Fungal infections affect all ages, sexes, races, and creeds.  There may be many factors that predispose you to a fungal infection such as age, coexisting medical conditions such as diabetes, or an autoimmune disease; stress, medications, fatigue, genetics, etc.  Bottom line: fungus thrives in a warm, moist environment and your shoes offer such a location.  IS IT CONTAGIOUS? Theoretically, yes.  You do not want to share shoes, nail clippers or files with someone who has fungal toenails.  Walking around barefoot in the same room or sleeping in the same bed is unlikely to transfer the organism.  It is important to realize, however, that fungus can spread easily from one nail to the next on the same foot.  HOW DO WE TREAT THIS?  There are several ways to treat this condition.  Treatment may depend on many factors such as age, medications, pregnancy, liver and kidney conditions, etc.  It is best to ask your doctor which options are available to you.  1. No treatment.   Unlike many other medical concerns, you can live with this condition.  However for many people this can be a painful condition and may lead to ingrown toenails or a bacterial infection.  It is recommended that you keep the nails cut short to help reduce the amount of fungal nail. 2. Topical treatment.  These range from herbal remedies to prescription strength nail lacquers.  About 40-50% effective, topicals require twice daily application for approximately 9 to 12 months or until an entirely new nail has grown out.  The most effective topicals are medical grade medications available through physicians offices. 3. Oral antifungal medications.  With an 80-90% cure rate, the most common oral medication requires 3 to 4 months of therapy and stays in your system for a year as the new nail grows out.  Oral  antifungal medications do require blood work to make sure it is a safe drug for you.  A liver function panel will be performed prior to starting the medication and after the first month of treatment.  It is important to have the blood work performed to avoid any harmful side effects.  In general, this medication safe but blood work is required. 4. Laser Therapy.  This treatment is performed by applying a specialized laser to the affected nail plate.  This therapy is noninvasive, fast, and non-painful.  It is not covered by insurance and is therefore, out of pocket.  The results have been very good with a 80-95% cure rate.  [facility] is the only practice in the area to offer this therapy. 5. Permanent Nail Avulsion.  Removing the entire nail so that a new nail will not grow back. Diabetes Mellitus and Foot Care Foot care is an important part of your health, especially when you have diabetes. Diabetes may cause you to have problems because of poor blood flow (circulation) to your feet and legs, which can cause your skin to:  Become thinner and drier.  Break more easily.  Heal more slowly.  Peel and crack. You may also have nerve damage (neuropathy) in your legs and feet, causing decreased feeling in them. This means that you may not notice minor injuries to your feet that could lead to more serious problems. Noticing and addressing any potential problems early is the best way to prevent future   foot problems. How to care for your feet Foot hygiene  Wash your feet daily with warm water and mild soap. Do not use hot water. Then, pat your feet and the areas between your toes until they are completely dry. Do not soak your feet as this can dry your skin.  Trim your toenails straight across. Do not dig under them or around the cuticle. File the edges of your nails with an emery board or nail file.  Apply a moisturizing lotion or petroleum jelly to the skin on your feet and to dry, brittle  toenails. Use lotion that does not contain alcohol and is unscented. Do not apply lotion between your toes. Shoes and socks  Wear clean socks or stockings every day. Make sure they are not too tight. Do not wear knee-high stockings since they may decrease blood flow to your legs.  Wear shoes that fit properly and have enough cushioning. Always look in your shoes before you put them on to be sure there are no objects inside.  To break in new shoes, wear them for just a few hours a day. This prevents injuries on your feet. Wounds, scrapes, corns, and calluses  Check your feet daily for blisters, cuts, bruises, sores, and redness. If you cannot see the bottom of your feet, use a mirror or ask someone for help.  Do not cut corns or calluses or try to remove them with medicine.  If you find a minor scrape, cut, or break in the skin on your feet, keep it and the skin around it clean and dry. You may clean these areas with mild soap and water. Do not clean the area with peroxide, alcohol, or iodine.  If you have a wound, scrape, corn, or callus on your foot, look at it several times a day to make sure it is healing and not infected. Check for: ? Redness, swelling, or pain. ? Fluid or blood. ? Warmth. ? Pus or a bad smell. General instructions  Do not cross your legs. This may decrease blood flow to your feet.  Do not use heating pads or hot water bottles on your feet. They may burn your skin. If you have lost feeling in your feet or legs, you may not know this is happening until it is too late.  Protect your feet from hot and cold by wearing shoes, such as at the beach or on hot pavement.  Schedule a complete foot exam at least once a year (annually) or more often if you have foot problems. If you have foot problems, report any cuts, sores, or bruises to your health care provider immediately. Contact a health care provider if:  You have a medical condition that increases your risk of  infection and you have any cuts, sores, or bruises on your feet.  You have an injury that is not healing.  You have redness on your legs or feet.  You feel burning or tingling in your legs or feet.  You have pain or cramps in your legs and feet.  Your legs or feet are numb.  Your feet always feel cold.  You have pain around a toenail. Get help right away if:  You have a wound, scrape, corn, or callus on your foot and: ? You have pain, swelling, or redness that gets worse. ? You have fluid or blood coming from the wound, scrape, corn, or callus. ? Your wound, scrape, corn, or callus feels warm to the touch. ? You have   pus or a bad smell coming from the wound, scrape, corn, or callus. ? You have a fever. ? You have a red line going up your leg. Summary  Check your feet every day for cuts, sores, red spots, swelling, and blisters.  Moisturize feet and legs daily.  Wear shoes that fit properly and have enough cushioning.  If you have foot problems, report any cuts, sores, or bruises to your health care provider immediately.  Schedule a complete foot exam at least once a year (annually) or more often if you have foot problems. This information is not intended to replace advice given to you by your health care provider. Make sure you discuss any questions you have with your health care provider. Document Released: 06/24/2000 Document Revised: 08/09/2017 Document Reviewed: 07/29/2016 Elsevier Interactive Patient Education  2019 [employer]  Corns and Calluses Corns are small areas of thickened skin that occur on the top, sides, or tip of a toe. They contain a cone-shaped core with a point that can press on a nerve below. This causes pain.  Calluses are areas of thickened skin that can occur anywhere on the body, including the hands, fingers, palms, soles of the feet, and heels. Calluses are usually larger than corns. What are the causes? Corns and calluses are caused by rubbing  (friction) or pressure, such as from shoes that are too tight or do not fit properly. What increases the risk? Corns are more likely to develop in people who have misshapen toes (toe deformities), such as hammer toes. Calluses can occur with friction to any area of the skin. They are more likely to develop in people who:  Work with their hands.  Wear shoes that fit poorly, are too tight, or are high-heeled.  Have toe deformities. What are the signs or symptoms? Symptoms of a corn or callus include:  A hard growth on the skin.  Pain or tenderness under the skin.  Redness and swelling.  Increased discomfort while wearing tight-fitting shoes, if your feet are affected. If a corn or callus becomes infected, symptoms may include:  Redness and swelling that gets worse.  Pain.  Fluid, blood, or pus draining from the corn or callus. How is this diagnosed? Corns and calluses may be diagnosed based on your symptoms, your medical history, and a physical exam. How is this treated? Treatment for corns and calluses may include:  Removing the cause of the friction or pressure. This may involve: ? Changing your shoes. ? Wearing shoe inserts (orthotics) or other protective layers in your shoes, such as a corn pad. ? Wearing gloves.  Applying medicine to the skin (topical medicine) to help soften skin in the hardened, thickened areas.  Removing layers of dead skin with a file to reduce the size of the corn or callus.  Removing the corn or callus with a scalpel or laser.  Taking antibiotic medicines, if your corn or callus is infected.  Having surgery, if a toe deformity is the cause. Follow these instructions at home:   Take over-the-counter and prescription medicines only as told by your health care provider.  If you were prescribed an antibiotic, take it as told by your health care provider. Do not stop taking it even if your condition starts to improve.  Wear shoes that fit  well. Avoid wearing high-heeled shoes and shoes that are too tight or too loose.  Wear any padding, protective layers, gloves, or orthotics as told by your health care provider.  Soak your   hands or feet and then use a file or pumice stone to soften your corn or callus. Do this as told by your health care provider.  Check your corn or callus every day for symptoms of infection. Contact a health care provider if you:  Notice that your symptoms do not improve with treatment.  Have redness or swelling that gets worse.  Notice that your corn or callus becomes painful.  Have fluid, blood, or pus coming from your corn or callus.  Have new symptoms. Summary  Corns are small areas of thickened skin that occur on the top, sides, or tip of a toe.  Calluses are areas of thickened skin that can occur anywhere on the body, including the hands, fingers, palms, and soles of the feet. Calluses are usually larger than corns.  Corns and calluses are caused by rubbing (friction) or pressure, such as from shoes that are too tight or do not fit properly.  Treatment may include wearing any padding, protective layers, gloves, or orthotics as told by your health care provider. This information is not intended to replace advice given to you by your health care provider. Make sure you discuss any questions you have with your health care provider. Document Released: 04/02/2004 Document Revised: 05/10/2017 Document Reviewed: 05/10/2017 Elsevier Interactive Patient Education  2019 [employer] Inc.  

## 2018-12-09 ENCOUNTER — Encounter: Payer: Self-pay | Admitting: Podiatry

## 2018-12-09 NOTE — Progress Notes (Signed)
Subjective:  Sophia Suarez presents to clinic today with cc of  painful, thick, discolored, elongated toenails 1-5 b/l that become tender and cannot cut because of thickness.  Pain is aggravated when wearing enclosed shoe gear.  Her blood glucose was 144 mg/dl this morning per patient.  Today, she is complaining of painful left foot arch pain for the past 6 weeks. She relates at times it is a dull ache and at other times it's a sharp pain.  She has tried Tylenol Arthritis and anti-inflammatories.  Mila Palmer, MD is her PCP.    Current Outpatient Medications:  .  alendronate (FOSAMAX) 70 MG tablet, Take 70 mg by mouth every Tuesday. Take with a full glass of water on an empty stomach. , Disp: , Rfl:  .  aspirin EC 81 MG tablet, Take 81 mg by mouth at bedtime., Disp: , Rfl:  .  Biotin 1000 MCG tablet, Take 2,000 mcg by mouth daily., Disp: , Rfl:  .  brimonidine (ALPHAGAN) 0.2 % ophthalmic solution, Place 1 drop into both eyes 2 (two) times daily., Disp: , Rfl:  .  Calcium Carb-Cholecalciferol (CALCIUM PLUS VITAMIN D3 PO), Take 1 tablet by mouth daily., Disp: , Rfl:  .  cetirizine (ZYRTEC ALLERGY) 10 MG tablet, Take 10 mg by mouth at bedtime., Disp: , Rfl:  .  clopidogrel (PLAVIX) 75 MG tablet, TAKE 1 TABLET (75 MG TOTAL) BY MOUTH DAILY., Disp: 30 tablet, Rfl: 0 .  Coenzyme Q10-Vitamin E (QUNOL ULTRA COQ10 PO), Take 1 capsule by mouth daily., Disp: , Rfl:  .  diphenhydrAMINE (BENADRYL) 25 mg capsule, Take 25 mg by mouth daily. , Disp: , Rfl:  .  docusate sodium (COLACE) 100 MG capsule, Take 100 mg by mouth at bedtime as needed for mild constipation., Disp: , Rfl:  .  dorzolamide (TRUSOPT) 2 % ophthalmic solution, Place 1 drop into both eyes 2 (two) times daily. , Disp: , Rfl:  .  Glucos-Chond-Hyal Ac-Ca Fructo (MOVE FREE JOINT HEALTH ADVANCE) TABS, Take 2 tablets by mouth daily., Disp: , Rfl:  .  hydrochlorothiazide (MICROZIDE) 12.5 MG capsule, Take 1 capsule (12.5 mg total) by mouth  daily. Please make overdue appt with Dr. Eden Emms before anymore refills. 1st attempt, Disp: 90 capsule, Rfl: 0 .  losartan (COZAAR) 100 MG tablet, Take 100 mg by mouth daily. , Disp: , Rfl:  .  Multiple Vitamin (MULTIVITAMIN WITH MINERALS) TABS tablet, Take 1 tablet by mouth daily. Centrum Silver, Disp: , Rfl:  .  Omega-3 Fatty Acids (FISH OIL) 1000 MG CAPS, Take 2,000 mg by mouth daily., Disp: , Rfl:  .  omeprazole (PRILOSEC) 20 MG capsule, Take 20 mg by mouth 2 (two) times daily. , Disp: , Rfl:  .  Probiotic Product (PROBIOTIC DAILY PO), Take 1 capsule by mouth daily., Disp: , Rfl:  .  simvastatin (ZOCOR) 40 MG tablet, Take 40 mg by mouth every evening., Disp: , Rfl:  .  sodium chloride (OCEAN) 0.65 % SOLN nasal spray, Place 1 spray into both nostrils 4 (four) times daily as needed for congestion., Disp: , Rfl:  .  tiZANidine (ZANAFLEX) 2 MG tablet, Take 1 tablet (2 mg total) by mouth every 6 (six) hours as needed for muscle spasms., Disp: 60 tablet, Rfl: 0 .  triamcinolone cream (KENALOG) 0.1 %, Apply 1 application topically 2 (two) times daily as needed (for skin irriation/ rash/lichenoid dermatitis)., Disp: , Rfl:  .  vitamin B-12 (CYANOCOBALAMIN) 1000 MCG tablet, Take 1,000 mcg by mouth daily.,  Disp: , Rfl:    Allergies  Allergen Reactions  . Clindamycin/Lincomycin Anaphylaxis  . Other Anaphylaxis and Itching  . Septra [Sulfamethoxazole-Trimethoprim] Anaphylaxis  . Sulfa Antibiotics Anaphylaxis  . Melatonin Other (See Comments) and Hypertension    Over active   . Bee Pollen Other (See Comments)    Congestion.  . Codeine Other (See Comments)    Unknown  . Horse-Derived Products Itching  . Latex Hives  . Mold Extract [Trichophyton] Other (See Comments)    Congestion.  . Pollen Extract Other (See Comments)    Congestion.     Objective: Vitals:   11/30/18 1026  Temp: 97.9 F (36.6 C)    Physical Examination:  Vascular Examination: Capillary refill time immediate x 10  digits.  Palpable DP/PT pulses b/l.  Digital hair present b/l.  No edema noted b/l.  Skin temperature gradient WNL b/l.  Dermatological Examination: Skin with normal turgor, texture and tone b/l.  No open wounds b/l.  No interdigital macerations noted b/l.  Elongated, thick, discolored brittle toenails with subungual debris and pain on dorsal palpation of nailbeds 1-5 b/l.  Hyperkeratotic lesion submet head 1 b/l with tenderness to palpation. No edema, no erythema, no drainage, no flocculence.  Musculoskeletal Examination: Muscle strength 5/5 to all muscle groups b/l.  Tenderness to palpation medial arch extending proximally along PT tendon. No edema, no erythema, no flocculence, no drainage, no warmth, no nodulation.  No pain, crepitus or joint discomfort with active/passive ROM.  Neurological Examination: Sensation intact 5/5 b/l with 10 gram monofilament.  Vibratory sensation intact b/l.  Proprioceptive sensation intact b/l.  Assessment: Mycotic nail infection with pain 1-5 b/l Callus submet head 1 b/l. Posterior tibial tendonitis left foot  Plan: 1. Toenails 1-5 b/l were debrided in length and girth without iatrogenic laceration. 2. Calluses pared submetatarsal head(s) 1 b/l utilizing sterile scalpel blade without incident. Dispensed Trilock ankle brace for daily use for left foot PT tendonitis. Call if symptoms worsen. Continue soft, supportive shoe gear daily. Report any pedal injuries to medical professional. Follow up 3 months. Patient/POA to call should there be a question/concern in there interim.

## 2019-01-25 ENCOUNTER — Other Ambulatory Visit: Payer: Self-pay | Admitting: Cardiovascular Disease

## 2019-02-17 ENCOUNTER — Other Ambulatory Visit: Payer: Self-pay | Admitting: Cardiovascular Disease

## 2019-03-05 ENCOUNTER — Encounter: Payer: Self-pay | Admitting: Podiatry

## 2019-03-05 ENCOUNTER — Other Ambulatory Visit: Payer: Self-pay | Admitting: Cardiovascular Disease

## 2019-03-05 ENCOUNTER — Other Ambulatory Visit: Payer: Self-pay

## 2019-03-05 ENCOUNTER — Ambulatory Visit (INDEPENDENT_AMBULATORY_CARE_PROVIDER_SITE_OTHER): Payer: Medicare Other | Admitting: Podiatry

## 2019-03-05 VITALS — Temp 97.7°F

## 2019-03-05 DIAGNOSIS — B351 Tinea unguium: Secondary | ICD-10-CM | POA: Diagnosis not present

## 2019-03-05 DIAGNOSIS — M79674 Pain in right toe(s): Secondary | ICD-10-CM

## 2019-03-05 DIAGNOSIS — L84 Corns and callosities: Secondary | ICD-10-CM

## 2019-03-05 DIAGNOSIS — M79675 Pain in left toe(s): Secondary | ICD-10-CM | POA: Diagnosis not present

## 2019-03-05 DIAGNOSIS — Z9229 Personal history of other drug therapy: Secondary | ICD-10-CM | POA: Diagnosis not present

## 2019-03-05 NOTE — Patient Instructions (Signed)

## 2019-03-08 ENCOUNTER — Other Ambulatory Visit: Payer: Self-pay | Admitting: Cardiovascular Disease

## 2019-03-13 NOTE — Progress Notes (Signed)
Subjective: Sophia Suarez is a 78 y.o. y.o. female who is on long term blood thinner Plavix and presents today with painful, discolored, thick toenails which interfere with daily activities. Pain is aggravated when wearing enclosed shoe gear. Pain is relieved with periodic professional debridement.  Pt also presents with painful callus formation noted submet head 1 b/l.   She states she is having fungus develop on one of her fingers. She is using Fungi-Nail with no noted improvement. Wants to know what can we recommend.  She voices no new pedal problems on today's visit.   Current Outpatient Medications:  .  alendronate (FOSAMAX) 70 MG tablet, Take 70 mg by mouth every Tuesday. Take with a full glass of water on an empty stomach. , Disp: , Rfl:  .  aspirin EC 81 MG tablet, Take 81 mg by mouth at bedtime., Disp: , Rfl:  .  Biotin 1000 MCG tablet, Take 2,000 mcg by mouth daily., Disp: , Rfl:  .  brimonidine (ALPHAGAN) 0.2 % ophthalmic solution, Place 1 drop into both eyes 2 (two) times daily., Disp: , Rfl:  .  Calcium Carb-Cholecalciferol (CALCIUM PLUS VITAMIN D3 PO), Take 1 tablet by mouth daily., Disp: , Rfl:  .  cetirizine (ZYRTEC ALLERGY) 10 MG tablet, Take 10 mg by mouth at bedtime., Disp: , Rfl:  .  clobetasol ointment (TEMOVATE) 0.05 %, , Disp: , Rfl:  .  clopidogrel (PLAVIX) 75 MG tablet, TAKE 1 TABLET (75 MG TOTAL) BY MOUTH DAILY., Disp: 30 tablet, Rfl: 0 .  Coenzyme Q10-Vitamin E (QUNOL ULTRA COQ10 PO), Take 1 capsule by mouth daily., Disp: , Rfl:  .  diphenhydrAMINE (BENADRYL) 25 mg capsule, Take 25 mg by mouth daily. , Disp: , Rfl:  .  docusate sodium (COLACE) 100 MG capsule, Take 100 mg by mouth at bedtime as needed for mild constipation., Disp: , Rfl:  .  dorzolamide (TRUSOPT) 2 % ophthalmic solution, Place 1 drop into both eyes 2 (two) times daily. , Disp: , Rfl:  .  dorzolamide-timolol (COSOPT) 22.3-6.8 MG/ML ophthalmic solution, , Disp: , Rfl:  .  Glucos-Chond-Hyal Ac-Ca  Fructo (MOVE FREE JOINT HEALTH ADVANCE) TABS, Take 2 tablets by mouth daily., Disp: , Rfl:  .  hydrochlorothiazide (MICROZIDE) 12.5 MG capsule, TAKE 1 CAPSULE BY MOUTH EVERY DAY, Disp: 30 capsule, Rfl: 0 .  hydrOXYzine (ATARAX/VISTARIL) 25 MG tablet, Take 25 mg by mouth at bedtime as needed., Disp: , Rfl:  .  losartan (COZAAR) 100 MG tablet, Take 100 mg by mouth daily. , Disp: , Rfl:  .  Multiple Vitamin (MULTIVITAMIN WITH MINERALS) TABS tablet, Take 1 tablet by mouth daily. Centrum Silver, Disp: , Rfl:  .  Omega-3 Fatty Acids (FISH OIL) 1000 MG CAPS, Take 2,000 mg by mouth daily., Disp: , Rfl:  .  omeprazole (PRILOSEC) 20 MG capsule, Take 20 mg by mouth 2 (two) times daily. , Disp: , Rfl:  .  Probiotic Product (PROBIOTIC DAILY PO), Take 1 capsule by mouth daily., Disp: , Rfl:  .  simvastatin (ZOCOR) 40 MG tablet, Take 40 mg by mouth every evening., Disp: , Rfl:  .  sodium chloride (OCEAN) 0.65 % SOLN nasal spray, Place 1 spray into both nostrils 4 (four) times daily as needed for congestion., Disp: , Rfl:  .  tiZANidine (ZANAFLEX) 2 MG tablet, Take 1 tablet (2 mg total) by mouth every 6 (six) hours as needed for muscle spasms., Disp: 60 tablet, Rfl: 0 .  triamcinolone cream (KENALOG) 0.1 %, Apply 1  application topically 2 (two) times daily as needed (for skin irriation/ rash/lichenoid dermatitis)., Disp: , Rfl:  .  vitamin B-12 (CYANOCOBALAMIN) 1000 MCG tablet, Take 1,000 mcg by mouth daily., Disp: , Rfl:    Allergies  Allergen Reactions  . Clindamycin/Lincomycin Anaphylaxis  . Other Anaphylaxis and Itching  . Septra [Sulfamethoxazole-Trimethoprim] Anaphylaxis  . Sulfa Antibiotics Anaphylaxis  . Melatonin Other (See Comments) and Hypertension    Over active   . Bee Pollen Other (See Comments)    Congestion.  . Codeine Other (See Comments)    Unknown  . Horse-Derived Products Itching  . Latex Hives  . Mold Extract [Trichophyton] Other (See Comments)    Congestion.  . Pollen Extract  Other (See Comments)    Congestion.     Objective: Vitals:   03/05/19 0930  Temp: 97.7 F (36.5 C)    Vascular Examination: Capillary refill time immediate x 10 digits.  Dorsalis pedis pulses palpable b/l.  Posterior tibial pulses palpable b/l.  Digital hair x 10 digits is present.  Skin temperature gradient WNL b/l.  Dermatological Examination: Skin with normal turgor, texture and tone b/l.  Toenails 1-5 b/l discolored, thick, dystrophic with subungual debris and pain with palpation to nailbeds due to thickness of nails.  Hyperkeratotic lesion submet head 1 b/l. No erythema, no edema, no drainage, no flocculence noted.   Musculoskeletal: Muscle strength 5/5 to all LE muscle groups.  Neurological: Sensation intact with 10 gram monofilament.  Vibratory sensation intact.  Assessment: Painful onychomycosis toenails 1-5 b/l in patient on blood thinner.  Callus submet head 1 b/l Patient on long term blood thinner, Plavix  Plan: 1. Toenails 1-5 b/l were debrided in length and girth without iatrogenic bleeding. 2. Regarding her fingernail fungus, I advised her to see her PCP or Dermatologist as fingernails are out of our scope of practice. 3. Hyperkeratotic lesion(s) submet head 1 b/l debrided utilizing sterile scalpel blade without incident. 4. Patient to continue soft, supportive shoe gear daily. 5. Patient to report any pedal injuries to medical professional immediately. 6. Avoid self trimming due to use of blood thinner. 7. Follow up 3 months. 8. Patient/POA to call should there be a concern in the interim.

## 2019-04-04 ENCOUNTER — Other Ambulatory Visit: Payer: Self-pay | Admitting: Cardiovascular Disease

## 2019-05-04 ENCOUNTER — Other Ambulatory Visit: Payer: Self-pay | Admitting: Cardiovascular Disease

## 2019-06-01 ENCOUNTER — Other Ambulatory Visit: Payer: Self-pay | Admitting: Cardiovascular Disease

## 2019-06-04 ENCOUNTER — Encounter: Payer: Self-pay | Admitting: Podiatry

## 2019-06-04 ENCOUNTER — Other Ambulatory Visit: Payer: Self-pay

## 2019-06-04 ENCOUNTER — Ambulatory Visit: Payer: Medicare Other | Admitting: Podiatry

## 2019-06-04 DIAGNOSIS — Z9229 Personal history of other drug therapy: Secondary | ICD-10-CM | POA: Diagnosis not present

## 2019-06-04 DIAGNOSIS — B351 Tinea unguium: Secondary | ICD-10-CM

## 2019-06-04 DIAGNOSIS — M79675 Pain in left toe(s): Secondary | ICD-10-CM | POA: Diagnosis not present

## 2019-06-04 DIAGNOSIS — M79674 Pain in right toe(s): Secondary | ICD-10-CM

## 2019-06-04 DIAGNOSIS — L84 Corns and callosities: Secondary | ICD-10-CM | POA: Diagnosis not present

## 2019-06-04 NOTE — Patient Instructions (Signed)

## 2019-06-08 NOTE — Progress Notes (Signed)
Subjective: Sophia Suarez presents to clinic with cc of painful mycotic toenails and calluses b/l feet which are aggravated when weightbearing with and without shoe gear.  This pain limits her daily activities. Pain symptoms resolve with periodic professional debridement.  Jonathon Jordan, MD is her PCP.   Medications reviewed in chart.  Allergies  Allergen Reactions  . Clindamycin/Lincomycin Anaphylaxis  . Other Anaphylaxis and Itching  . Septra [Sulfamethoxazole-Trimethoprim] Anaphylaxis  . Sulfa Antibiotics Anaphylaxis  . Melatonin Other (See Comments) and Hypertension    Over active   . Bee Pollen Other (See Comments)    Congestion.  . Codeine Other (See Comments)    Unknown  . Horse-Derived Products Itching  . Latex Hives  . Mold Extract [Trichophyton] Other (See Comments)    Congestion.  . Pollen Extract Other (See Comments)    Congestion.     Objective: There were no vitals filed for this visit.  Physical Examination:  Vascular  Examination: Capillary refill time immediate b/l.  Palpable DP/PT pulses b/l.  Digital hair present b/l.  No edema noted b/l.  Skin temperature gradient WNL b/l.  Dermatological Examination: Skin with normal turgor, texture and tone b/l.  No open wounds b/l.  No interdigital macerations noted b/l.  Elongated, thick, discolored brittle toenails with subungual debris and pain on dorsal palpation of nailbeds 1-5 b/l. Incurvated nailplate left great toe and left 2nd digit lateral border with tenderness to palpation. No erythema, no edema, no drainage noted.  Hyperkeratotic lesion submet head1 b/l, distal tip 2nd b/l, distal tip 3rd digit b/l with tenderness to palpation. No edema, no erythema, no drainage, no flocculence.   Musculoskeletal Examination: Muscle strength 5/5 to all muscle groups b/l.  No pain, crepitus or joint discomfort with active/passive ROM.  Neurological Examination: Sensation intact 5/5 b/l with 10 gram  monofilament.  Vibratory sensation intact b/l.  Proprioceptive sensation intact b/l.  Assessment: 1. Mycotic nail infection with pain 1-5 b/l 2. Callus submet head 1 b/l 3. Corn b/l 2nd digit 4. Patient on long term blood thinner  Plan: 1. Toenails 1-5 b/l were debrided in length and girth without iatrogenic laceration. 2. Calluses pared submetatarsal head 1 b/l utilizing sterile scalpel blade without incident. Corn b/l 2nd, b/l 3rd digits pared utilizing sterile scalpel blade without incident. 3. Continue soft, supportive shoe gear daily. 4. Report any pedal injuries to medical professional. 5. Follow up 3 months. 6. Patient/POA to call should there be a question/concern in there interim.

## 2019-09-04 ENCOUNTER — Other Ambulatory Visit: Payer: Self-pay

## 2019-09-04 ENCOUNTER — Encounter: Payer: Self-pay | Admitting: Podiatry

## 2019-09-04 ENCOUNTER — Ambulatory Visit: Payer: Medicare Other | Admitting: Podiatry

## 2019-09-04 VITALS — Temp 97.9°F

## 2019-09-04 DIAGNOSIS — M79674 Pain in right toe(s): Secondary | ICD-10-CM

## 2019-09-04 DIAGNOSIS — E119 Type 2 diabetes mellitus without complications: Secondary | ICD-10-CM | POA: Diagnosis not present

## 2019-09-04 DIAGNOSIS — L84 Corns and callosities: Secondary | ICD-10-CM | POA: Diagnosis not present

## 2019-09-04 DIAGNOSIS — B351 Tinea unguium: Secondary | ICD-10-CM | POA: Diagnosis not present

## 2019-09-04 DIAGNOSIS — M79675 Pain in left toe(s): Secondary | ICD-10-CM | POA: Diagnosis not present

## 2019-09-04 NOTE — Patient Instructions (Signed)
Corns and Calluses Corns are small areas of thickened skin that occur on the top, sides, or tip of a toe. They contain a cone-shaped core with a point that can press on a nerve below. This causes pain.  Calluses are areas of thickened skin that can occur anywhere on the body, including the hands, fingers, palms, soles of the feet, and heels. Calluses are usually larger than corns. What are the causes? Corns and calluses are caused by rubbing (friction) or pressure, such as from shoes that are too tight or do not fit properly. What increases the risk? Corns are more likely to develop in people who have misshapen toes (toe deformities), such as hammer toes. Calluses can occur with friction to any area of the skin. They are more likely to develop in people who:  Work with their hands.  Wear shoes that fit poorly, are too tight, or are high-heeled.  Have toe deformities. What are the signs or symptoms? Symptoms of a corn or callus include:  A hard growth on the skin.  Pain or tenderness under the skin.  Redness and swelling.  Increased discomfort while wearing tight-fitting shoes, if your feet are affected. If a corn or callus becomes infected, symptoms may include:  Redness and swelling that gets worse.  Pain.  Fluid, blood, or pus draining from the corn or callus. How is this diagnosed? Corns and calluses may be diagnosed based on your symptoms, your medical history, and a physical exam. How is this treated? Treatment for corns and calluses may include:  Removing the cause of the friction or pressure. This may involve: ? Changing your shoes. ? Wearing shoe inserts (orthotics) or other protective layers in your shoes, such as a corn pad. ? Wearing gloves.  Applying medicine to the skin (topical medicine) to help soften skin in the hardened, thickened areas.  Removing layers of dead skin with a file to reduce the size of the corn or callus.  Removing the corn or callus with a  scalpel or laser.  Taking antibiotic medicines, if your corn or callus is infected.  Having surgery, if a toe deformity is the cause. Follow these instructions at home:   Take over-the-counter and prescription medicines only as told by your health care provider.  If you were prescribed an antibiotic, take it as told by your health care provider. Do not stop taking it even if your condition starts to improve.  Wear shoes that fit well. Avoid wearing high-heeled shoes and shoes that are too tight or too loose.  Wear any padding, protective layers, gloves, or orthotics as told by your health care provider.  Soak your hands or feet and then use a file or pumice stone to soften your corn or callus. Do this as told by your health care provider.  Check your corn or callus every day for symptoms of infection. Contact a health care provider if you:  Notice that your symptoms do not improve with treatment.  Have redness or swelling that gets worse.  Notice that your corn or callus becomes painful.  Have fluid, blood, or pus coming from your corn or callus.  Have new symptoms. Summary  Corns are small areas of thickened skin that occur on the top, sides, or tip of a toe.  Calluses are areas of thickened skin that can occur anywhere on the body, including the hands, fingers, palms, and soles of the feet. Calluses are usually larger than corns.  Corns and calluses are caused by   rubbing (friction) or pressure, such as from shoes that are too tight or do not fit properly.  Treatment may include wearing any padding, protective layers, gloves, or orthotics as told by your health care provider. This information is not intended to replace advice given to you by your health care provider. Make sure you discuss any questions you have with your health care provider. Document Revised: 10/17/2018 Document Reviewed: 05/10/2017 Elsevier Patient Education  2020 [employer]  Onychomycosis/Fungal  Toenails  WHAT IS IT? An infection that lies within the keratin of your nail plate that is caused by a fungus.  WHY ME? Fungal infections affect all ages, sexes, races, and creeds.  There may be many factors that predispose you to a fungal infection such as age, coexisting medical conditions such as diabetes, or an autoimmune disease; stress, medications, fatigue, genetics, etc.  Bottom line: fungus thrives in a warm, moist environment and your shoes offer such a location.  IS IT CONTAGIOUS? Theoretically, yes.  You do not want to share shoes, nail clippers or files with someone who has fungal toenails.  Walking around barefoot in the same room or sleeping in the same bed is unlikely to transfer the organism.  It is important to realize, however, that fungus can spread easily from one nail to the next on the same foot.  HOW DO WE TREAT THIS?  There are several ways to treat this condition.  Treatment may depend on many factors such as age, medications, pregnancy, liver and kidney conditions, etc.  It is best to ask your doctor which options are available to you.  4. No treatment.   Unlike many other medical concerns, you can live with this condition.  However for many people this can be a painful condition and may lead to ingrown toenails or a bacterial infection.  It is recommended that you keep the nails cut short to help reduce the amount of fungal nail. 5. Topical treatment.  These range from herbal remedies to prescription strength nail lacquers.  About 40-50% effective, topicals require twice daily application for approximately 9 to 12 months or until an entirely new nail has grown out.  The most effective topicals are medical grade medications available through physicians offices. 6. Oral antifungal medications.  With an 80-90% cure rate, the most common oral medication requires 3 to 4 months of therapy and stays in your system for a year as the new nail grows out.  Oral antifungal medications do  require blood work to make sure it is a safe drug for you.  A liver function panel will be performed prior to starting the medication and after the first month of treatment.  It is important to have the blood work performed to avoid any harmful side effects.  In general, this medication safe but blood work is required. 7. Laser Therapy.  This treatment is performed by applying a specialized laser to the affected nail plate.  This therapy is noninvasive, fast, and non-painful.  It is not covered by insurance and is therefore, out of pocket.  The results have been very good with a 80-95% cure rate.  [facility] is the only practice in the area to offer this therapy. 8. Permanent Nail Avulsion.  Removing the entire nail so that a new nail will not grow back. 

## 2019-09-05 NOTE — Progress Notes (Signed)
Subjective: Sophia Suarez presents today for follow up of preventative diabetic foot care, painful mycotic nails b/l that are difficult to trim. Pain interferes with ambulation. Aggravating factors include wearing enclosed shoe gear. Pain is relieved with periodic professional debridement and painful corn(s) distal tip right 3rd digit  which interfere(s) with ambulation. Aggravating factors include wearing enclosed shoe gear. Pain is relieved with periodic professional debridement.   She relates her right 3rd digit is very tender on today. She tried foot pads dispensed to her on last visit, but they would not stay on. She relates she cut out a portion of her insole in her shoe and it made a big difference as far as comfort is concerned.  Allergies  Allergen Reactions  . Clindamycin/Lincomycin Anaphylaxis  . Other Anaphylaxis and Itching  . Septra [Sulfamethoxazole-Trimethoprim] Anaphylaxis  . Sulfa Antibiotics Anaphylaxis  . Melatonin Other (See Comments) and Hypertension    Over active   . Bee Pollen Other (See Comments)    Congestion.  . Codeine Other (See Comments)    Unknown  . Horse-Derived Products Itching  . Latex Hives  . Mold Extract [Trichophyton] Other (See Comments)    Congestion.  . Pollen Extract Other (See Comments)    Congestion.     Objective: Vitals:   09/04/19 1145  Temp: 97.9 F (36.6 C)    Vascular Examination:  Capillary refill time to digits immediate b/l, palpable DP pulses b/l, palpable PT pulses b/l, pedal hair present b/l and skin temperature gradient within normal limits b/l  Dermatological Examination: Pedal skin with normal turgor, texture and tone bilaterally, no open wounds bilaterally and toenails 1-5 b/l elongated, dystrophic, thickened, crumbly with subungual debris  Musculoskeletal: Normal muscle strength 5/5 to all lower extremity muscle groups bilaterally, no pain crepitus or joint limitation noted with ROM b/l and hammertoes noted to  the  2-5 bilaterally  Neurological: Protective sensation intact 5/5 intact bilaterally with 10g monofilament b/l and vibratory sensation intact b/l  Assessment: 1. Pain due to onychomycosis of toenails of both feet   2. Corns   3. Controlled type 2 diabetes mellitus without complication, without long-term current use of insulin (HCC)    Plan: -Continue diabetic foot care principles. Literature dispensed on today.  -Toenails 1-5 b/l were debrided in length and girth with sterile nail nippers and dremel without iatrogenic bleeding. -Corn(s) debrided distal tip right 3rd digit without complication or incident. Total number debrided=1 -Patient to continue soft, supportive shoe gear daily. -Patient to report any pedal injuries to medical professional immediately. -Patient/POA to call should there be question/concern in the interim.  Return in about 9 weeks (around 11/06/2019) for nail and callus trim/ Plavix.

## 2019-11-13 ENCOUNTER — Other Ambulatory Visit: Payer: Self-pay

## 2019-11-13 ENCOUNTER — Telehealth: Payer: Self-pay | Admitting: Cardiovascular Disease

## 2019-11-13 ENCOUNTER — Ambulatory Visit: Payer: Medicare Other | Admitting: Podiatry

## 2019-11-13 VITALS — Temp 96.8°F

## 2019-11-13 DIAGNOSIS — M79674 Pain in right toe(s): Secondary | ICD-10-CM | POA: Diagnosis not present

## 2019-11-13 DIAGNOSIS — M79675 Pain in left toe(s): Secondary | ICD-10-CM | POA: Diagnosis not present

## 2019-11-13 DIAGNOSIS — L600 Ingrowing nail: Secondary | ICD-10-CM

## 2019-11-13 DIAGNOSIS — B351 Tinea unguium: Secondary | ICD-10-CM

## 2019-11-13 DIAGNOSIS — E119 Type 2 diabetes mellitus without complications: Secondary | ICD-10-CM

## 2019-11-13 DIAGNOSIS — L84 Corns and callosities: Secondary | ICD-10-CM

## 2019-11-13 MED ORDER — HYDROCHLOROTHIAZIDE 12.5 MG PO CAPS: 12.5000 mg | ORAL_CAPSULE | Freq: Every day | ORAL | 0 refills | Status: DC

## 2019-11-13 NOTE — Telephone Encounter (Signed)
*  STAT* If patient is at the pharmacy, call can be transferred to refill team.   1. Which medications need to be refilled? (please list name of each medication and dose if known)  Hydrochlorothiazide  2. Which pharmacy/location (including street and city if local pharmacy) is medication to be sent to? [facility] [address] [place], [place]  3. Do they need a 30 day or 90 day supply? 90 days- pt have an appt for 01-01-20

## 2019-11-13 NOTE — Patient Instructions (Addendum)
EPSOM SALT FOOT SOAK INSTRUCTIONS  Shopping List:  A. Plain epsom salt (not scented) B. Neosporin Cream/Ointment or Bacitracin Cream/Ointment C. 1-inch fabric band-aids   1.  Place 1/4 cup of epsom salts in 2 quarts of warm tap water. IF YOU ARE DIABETIC, OR HAVE NEUROPATHY, CHECK THE TEMPERATURE OF THE WATER WITH YOUR ELBOW.  2.  Submerge your foot/feet in the solution and soak for 10-15 minutes.      3.  Next, remove your foot or feet from solution, blot dry the affected area.    4.  Apply antibiotic ointment and cover with fabric band-aid .  5.  This soak should be done once a day for 7 days.   6.  Monitor for any signs/symptoms of infection such as redness, swelling, odor, drainage, increased pain, or non-healing of digit.   7.  Please do not hesitate to call the office and speak to a Nurse or Doctor if you have questions.   8.  If you experience fever, chills, nightsweats, nausea or vomiting with worsening of digit, please go to the emergency room.     Corns and Calluses Corns are small areas of thickened skin that occur on the top, sides, or tip of a toe. They contain a cone-shaped core with a point that can press on a nerve below. This causes pain.  Calluses are areas of thickened skin that can occur anywhere on the body, including the hands, fingers, palms, soles of the feet, and heels. Calluses are usually larger than corns. What are the causes? Corns and calluses are caused by rubbing (friction) or pressure, such as from shoes that are too tight or do not fit properly. What increases the risk? Corns are more likely to develop in people who have misshapen toes (toe deformities), such as hammer toes. Calluses can occur with friction to any area of the skin. They are more likely to develop in people who:  Work with their hands.  Wear shoes that fit poorly, are too tight, or are high-heeled.  Have toe deformities. What are the signs or symptoms? Symptoms of a corn or  callus include:  A hard growth on the skin.  Pain or tenderness under the skin.  Redness and swelling.  Increased discomfort while wearing tight-fitting shoes, if your feet are affected. If a corn or callus becomes infected, symptoms may include:  Redness and swelling that gets worse.  Pain.  Fluid, blood, or pus draining from the corn or callus. How is this diagnosed? Corns and calluses may be diagnosed based on your symptoms, your medical history, and a physical exam. How is this treated? Treatment for corns and calluses may include:  Removing the cause of the friction or pressure. This may involve: ? Changing your shoes. ? Wearing shoe inserts (orthotics) or other protective layers in your shoes, such as a corn pad. ? Wearing gloves.  Applying medicine to the skin (topical medicine) to help soften skin in the hardened, thickened areas.  Removing layers of dead skin with a file to reduce the size of the corn or callus.  Removing the corn or callus with a scalpel or laser.  Taking antibiotic medicines, if your corn or callus is infected.  Having surgery, if a toe deformity is the cause. Follow these instructions at home:   Take over-the-counter and prescription medicines only as told by your health care provider.  If you were prescribed an antibiotic, take it as told by your health care provider. Do not stop  taking it even if your condition starts to improve.  Wear shoes that fit well. Avoid wearing high-heeled shoes and shoes that are too tight or too loose.  Wear any padding, protective layers, gloves, or orthotics as told by your health care provider.  Soak your hands or feet and then use a file or pumice stone to soften your corn or callus. Do this as told by your health care provider.  Check your corn or callus every day for symptoms of infection. Contact a health care provider if you:  Notice that your symptoms do not improve with treatment.  Have redness  or swelling that gets worse.  Notice that your corn or callus becomes painful.  Have fluid, blood, or pus coming from your corn or callus.  Have new symptoms. Summary  Corns are small areas of thickened skin that occur on the top, sides, or tip of a toe.  Calluses are areas of thickened skin that can occur anywhere on the body, including the hands, fingers, palms, and soles of the feet. Calluses are usually larger than corns.  Corns and calluses are caused by rubbing (friction) or pressure, such as from shoes that are too tight or do not fit properly.  Treatment may include wearing any padding, protective layers, gloves, or orthotics as told by your health care provider. This information is not intended to replace advice given to you by your health care provider. Make sure you discuss any questions you have with your health care provider. Document Revised: 10/17/2018 Document Reviewed: 05/10/2017 Elsevier Patient Education  2020 [employer]  Onychomycosis/Fungal Toenails  WHAT IS IT? An infection that lies within the keratin of your nail plate that is caused by a fungus.  WHY ME? Fungal infections affect all ages, sexes, races, and creeds.  There may be many factors that predispose you to a fungal infection such as age, coexisting medical conditions such as diabetes, or an autoimmune disease; stress, medications, fatigue, genetics, etc.  Bottom line: fungus thrives in a warm, moist environment and your shoes offer such a location.  IS IT CONTAGIOUS? Theoretically, yes.  You do not want to share shoes, nail clippers or files with someone who has fungal toenails.  Walking around barefoot in the same room or sleeping in the same bed is unlikely to transfer the organism.  It is important to realize, however, that fungus can spread easily from one nail to the next on the same foot.  HOW DO WE TREAT THIS?  There are several ways to treat this condition.  Treatment may depend on many factors  such as age, medications, pregnancy, liver and kidney conditions, etc.  It is best to ask your doctor which options are available to you.  4. No treatment.   Unlike many other medical concerns, you can live with this condition.  However for many people this can be a painful condition and may lead to ingrown toenails or a bacterial infection.  It is recommended that you keep the nails cut short to help reduce the amount of fungal nail. 5. Topical treatment.  These range from herbal remedies to prescription strength nail lacquers.  About 40-50% effective, topicals require twice daily application for approximately 9 to 12 months or until an entirely new nail has grown out.  The most effective topicals are medical grade medications available through physicians offices. 6. Oral antifungal medications.  With an 80-90% cure rate, the most common oral medication requires 3 to 4 months of therapy and stays  in your system for a year as the new nail grows out.  Oral antifungal medications do require blood work to make sure it is a safe drug for you.  A liver function panel will be performed prior to starting the medication and after the first month of treatment.  It is important to have the blood work performed to avoid any harmful side effects.  In general, this medication safe but blood work is required. 7. Laser Therapy.  This treatment is performed by applying a specialized laser to the affected nail plate.  This therapy is noninvasive, fast, and non-painful.  It is not covered by insurance and is therefore, out of pocket.  The results have been very good with a 80-95% cure rate.  [facility] is the only practice in the area to offer this therapy. 8. Permanent Nail Avulsion.  Removing the entire nail so that a new nail will not grow back.

## 2019-11-13 NOTE — Telephone Encounter (Signed)
Pt's medication was sent to pt's pharmacy as requested. Confirmation received.  °

## 2019-11-14 ENCOUNTER — Encounter: Payer: Self-pay | Admitting: Podiatry

## 2019-11-14 NOTE — Progress Notes (Signed)
Subjective: Sophia Suarez presents to clinic today for preventative diabetic foot care, painful mycotic nails b/l that are difficult to trim. Pain interferes with ambulation. Aggravating factors include wearing enclosed shoe gear. Pain is relieved with periodic professional debridement and corn(s) right 3rd digit  which interfere(s) with ambulation. Aggravating factors include wearing enclosed shoe gear. Pain is relieved with periodic professional debridement.  Patient also presents with cc of painful, ingrown toenail of lateral border R hallux.  Pain has been present for 2 weeks.  Attempted treatments include: None  Past Medical History:  Diagnosis Date  . Allergic rhinitis, seasonal   . Anemia   . Arthritis   . Asthma   . Carotid artery occlusion   . Chronic kidney disease   . Chronic pain   . Complication of anesthesia    makes her hyper  . Diabetes mellitus    no medication  diet controlled  . Diverticulitis   . Dyspnea    with exertion  . GERD (gastroesophageal reflux disease)   . Heart murmur   . Hyperlipidemia   . Hypertension   . Lumbago   . Panic attacks   . Stroke (HCC)   . Urinary incontinence      Past Surgical History:  Procedure Laterality Date  . ABDOMINAL HYSTERECTOMY  1973   Partial  . APPENDECTOMY  1958  . BIOPSY THYROID  Dec 05, 2013   Tumor- Benign  . COLECTOMY  2003   extent uncertain  . EYE SURGERY Right October 10, 2014   Cataract  . EYE SURGERY Left  October 17, 2014   Cataract  . KNEE ARTHROSCOPY Left   . MASS EXCISION Right 04/23/2018   Procedure: EXCISION OF SKIN MASS WITH PRIMARY CLOSURE;  Surgeon: Serena Colonel, MD;  Location: [facility];  Service: ENT;  Laterality: Right;  . OVARIAN CYST SURGERY    . PAROTIDECTOMY Right 04/23/2018   Procedure: Superficial Parotidectomy with nerve dissection;  Surgeon: Serena Colonel, MD;  Location: [facility];  Service: ENT;  Laterality: Right;  . PARTIAL HYSTERECTOMY    .  TOTAL KNEE ARTHROPLASTY Left 09/15/2017   Procedure: TOTAL KNEE ARTHROPLASTY;  Surgeon: Gean Birchwood, MD;  Location: [facility];  Service: Orthopedics;  Laterality: Left;     Current Outpatient Medications on File Prior to Visit  Medication Sig Dispense Refill  . alendronate (FOSAMAX) 70 MG tablet Take 70 mg by mouth every Tuesday. Take with a full glass of water on an empty stomach.     Marland Kitchen aspirin EC 81 MG tablet Take 81 mg by mouth at bedtime.    . Biotin 1000 MCG tablet Take 2,000 mcg by mouth daily.    . brimonidine (ALPHAGAN) 0.2 % ophthalmic solution Place 1 drop into both eyes 2 (two) times daily.    . Calcium Carb-Cholecalciferol (CALCIUM PLUS VITAMIN D3 PO) Take 1 tablet by mouth daily.    . cetirizine (ZYRTEC ALLERGY) 10 MG tablet Take 10 mg by mouth at bedtime.    . clobetasol ointment (TEMOVATE) 0.05 %     . clopidogrel (PLAVIX) 75 MG tablet TAKE 1 TABLET (75 MG TOTAL) BY MOUTH DAILY. 30 tablet 0  . Coenzyme Q10-Vitamin E (QUNOL ULTRA COQ10 PO) Take 1 capsule by mouth daily.    . diphenhydrAMINE (BENADRYL) 25 mg capsule Take 25 mg by mouth daily.     Marland Kitchen docusate sodium (COLACE) 100 MG capsule Take 100 mg by mouth at bedtime as needed for mild constipation.    Marland Kitchen  dorzolamide (TRUSOPT) 2 % ophthalmic solution Place 1 drop into both eyes 2 (two) times daily.     . dorzolamide-timolol (COSOPT) 22.3-6.8 MG/ML ophthalmic solution     . Glucos-Chond-Hyal Ac-Ca Fructo (MOVE FREE JOINT HEALTH ADVANCE) TABS Take 2 tablets by mouth daily.    . hydrOXYzine (ATARAX/VISTARIL) 25 MG tablet Take 25 mg by mouth at bedtime as needed.    Marland Kitchen losartan (COZAAR) 100 MG tablet Take 100 mg by mouth daily.     . Multiple Vitamin (MULTIVITAMIN WITH MINERALS) TABS tablet Take 1 tablet by mouth daily. Centrum Silver    . Omega-3 Fatty Acids (FISH OIL) 1000 MG CAPS Take 2,000 mg by mouth daily.    . pantoprazole (PROTONIX) 20 MG tablet TAKE 1 TABLET ONCE A DAY APPROXIMATELY 30 MINUTES BEFORE BREAKFAST    . Probiotic  Product (PROBIOTIC DAILY PO) Take 1 capsule by mouth daily.    . simvastatin (ZOCOR) 40 MG tablet Take 40 mg by mouth every evening.    . sodium chloride (OCEAN) 0.65 % SOLN nasal spray Place 1 spray into both nostrils 4 (four) times daily as needed for congestion.    . timolol (TIMOPTIC) 0.5 % ophthalmic solution Apply to eye.    Marland Kitchen tiZANidine (ZANAFLEX) 2 MG tablet Take 1 tablet (2 mg total) by mouth every 6 (six) hours as needed for muscle spasms. 60 tablet 0  . triamcinolone cream (KENALOG) 0.1 % Apply 1 application topically 2 (two) times daily as needed (for skin irriation/ rash/lichenoid dermatitis).    . vitamin B-12 (CYANOCOBALAMIN) 1000 MCG tablet Take 1,000 mcg by mouth daily.     No current facility-administered medications on file prior to visit.     Allergies  Allergen Reactions  . Clindamycin/Lincomycin Anaphylaxis  . Other Anaphylaxis and Itching  . Septra [Sulfamethoxazole-Trimethoprim] Anaphylaxis  . Sulfa Antibiotics Anaphylaxis  . Melatonin Other (See Comments) and Hypertension    Over active   . Bee Pollen Other (See Comments)    Congestion.  . Codeine Other (See Comments)    Unknown  . Horse-Derived Products Itching  . Latex Hives  . Mold Extract [Trichophyton] Other (See Comments)    Congestion.  . Pollen Extract Other (See Comments)    Congestion.     Family History  Problem Relation Age of Onset  . Aneurysm Mother        aortic  . Coronary artery disease Mother        CABG  . Cancer Mother   . Diabetes Mother        Amputation  . Heart disease Mother        AAA  . Hypertension Mother   . Hyperlipidemia Mother   . Cancer Father        Brain  . Hypertension Father   . Deep vein thrombosis Father   . Heart disease Father   . Hyperlipidemia Father   . Hypertension Brother   . Hypertension Son     Social History   Occupational History  . Occupation: Retired    Comment: [employer].  Tobacco Use  . Smoking status: Never Smoker  .  Smokeless tobacco: Never Used  Substance and Sexual Activity  . Alcohol use: Yes    Alcohol/week: 0.0 - 1.0 standard drinks    Comment: occasionally; 1 drink monthly  . Drug use: No  . Sexual activity: Not on file      Immunization History  Administered Date(s) Administered  . Influenza, High Dose Seasonal PF 04/11/2018  .  Td 11/28/2017     Objective: Vitals:   11/13/19 1001  Temp: (!) 96.8 F (36 C)    Patient is a pleasant 79 y.o. Caucasian female  IN NAD. AAO X 3.  Vascular Examination: Capillary refill time to digits immediate b/l. Palpable DP pulses b/l. Palpable PT pulses b/l. Pedal hair present b/l. Skin temperature gradient within normal limits b/l.  Dermatological Examination: Pedal skin with normal turgor, texture and tone bilaterally. No open wounds bilaterally. No interdigital macerations bilaterally. Toenails 1-5 b/l elongated, dystrophic, thickened, crumbly with subungual debris and tenderness to dorsal palpation. Hyperkeratotic lesion(s) R 3rd toe.  No erythema, no edema, no drainage, no flocculence.   Incurvated nailplate right great toe b/l border(s) with tenderness to palpation. No erythema, no edema, no drainage noted.  Musculoskeletal Examination: Normal muscle strength 5/5 to all lower extremity muscle groups bilaterally. No pain crepitus or joint limitation noted with ROM b/l. Hammertoes noted to the 2-5 bilaterally.  Neurological Examination: Protective sensation intact 5/5 intact bilaterally with 10g monofilament b/l. Vibratory sensation intact b/l. Proprioception intact bilaterally.  Assessment: Pain due to onychomycosis of toenails of both feet  Ingrown toenail without infection  Corns  Controlled type 2 diabetes mellitus without complication, without long-term current use of insulin ([place])  Plan: 1. Discussed diagnosis of with treatment options. 2. Patient/POA agreed to have temporary total nail avulsion of right great toe. 3. Consent form  signed and in chart 4. Prepped R hallux with betadine solution. Toenails 1-5 left, 2-5 right  debrided in length and girth without iatrogenic laceration. 5. Local injection of 3 cc's 50/50 mix of 2% Lidocaine plain and 0.5% Bupivicaine plain administered via hallux block. Entire nailplate removed with hemostat. Borders cleansed with alcohol. Light bleeding controlled with Lumicain Hemostatic Solution. Light dressng applied. 6. Discused post-procedure instructions of epsom salt warm water soaks and dispensed written handout to patient/POA. 7. Remaining toenails x 9 debrided in length and girth without complication. 8. Patient/POA related understanding of post-procedure instructions 9. Follow up 2 weeks. Call office if there is increased redness, swelling, drainage, odor or pain.  Return in about 2 weeks (around 11/27/2019) for right nail check.

## 2019-11-18 ENCOUNTER — Telehealth: Payer: Self-pay | Admitting: *Deleted

## 2019-11-18 NOTE — Telephone Encounter (Signed)
I spoke with pt and told her she may have bleeding or oozing for the next 2-3 weeks, but if she had the entire toenail removed and was also still on aspirin she may have just a little bit more bleeding than another person. Pt states she is noticing the blood and neosporin goo when she pulls the bandaid off. I told pt that was not unusual, that she may benefit from soaking the dressing off rather than dislodging the scabbing, that we were trying to get to. Pt states understanding.

## 2019-11-18 NOTE — Telephone Encounter (Signed)
Pt states she had an ingrown toenail procedure Wednesday and she still had bleeding.

## 2019-11-27 ENCOUNTER — Encounter: Payer: Self-pay | Admitting: Podiatry

## 2019-11-27 ENCOUNTER — Ambulatory Visit (INDEPENDENT_AMBULATORY_CARE_PROVIDER_SITE_OTHER): Payer: Medicare Other | Admitting: Podiatry

## 2019-11-27 ENCOUNTER — Other Ambulatory Visit: Payer: Self-pay

## 2019-11-27 DIAGNOSIS — L600 Ingrowing nail: Secondary | ICD-10-CM | POA: Diagnosis not present

## 2019-11-27 DIAGNOSIS — M79674 Pain in right toe(s): Secondary | ICD-10-CM | POA: Diagnosis not present

## 2019-11-27 NOTE — Patient Instructions (Signed)
Ingrown Toenail An ingrown toenail occurs when the corner or sides of a toenail grow into the surrounding skin. This causes discomfort and pain. The big toe is most commonly affected, but any of the toes can be affected. If an ingrown toenail is not treated, it can become infected. What are the causes? This condition may be caused by:  Wearing shoes that are too small or tight.  An injury, such as stubbing your toe or having your toe stepped on.  Improper cutting or care of your toenails.  Having nail or foot abnormalities that were present from birth (congenital abnormalities), such as having a nail that is too big for your toe. What increases the risk? The following factors may make you more likely to develop ingrown toenails:  Age. Nails tend to get thicker with age, so ingrown nails are more common among older people.  Cutting your toenails incorrectly, such as cutting them very short or cutting them unevenly. An ingrown toenail is more likely to get infected if you have:  Diabetes.  Blood flow (circulation) problems. What are the signs or symptoms? Symptoms of an ingrown toenail may include:  Pain, soreness, or tenderness.  Redness.  Swelling.  Hardening of the skin that surrounds the toenail. Signs that an ingrown toenail may be infected include:  Fluid or pus.  Symptoms that get worse instead of better. How is this diagnosed? An ingrown toenail may be diagnosed based on your medical history, your symptoms, and a physical exam. If you have fluid or blood coming from your toenail, a sample may be collected to test for the specific type of bacteria that is causing the infection. How is this treated? Treatment depends on how severe your ingrown toenail is. You may be able to care for your toenail at home.  If you have an infection, you may be prescribed antibiotic medicines.  If you have fluid or pus draining from your toenail, your health care provider may drain  it.  If you have trouble walking, you may be given crutches to use.  If you have a severe or infected ingrown toenail, you may need a procedure to remove part or all of the nail. Follow these instructions at home: Foot care   Do not pick at your toenail or try to remove it yourself.  Soak your foot in warm, soapy water. Do this for 20 minutes, 3 times a day, or as often as told by your health care provider. This helps to keep your toe clean and keep your skin soft.  Wear shoes that fit well and are not too tight. Your health care provider may recommend that you wear open-toed shoes while you heal.  Trim your toenails regularly and carefully. Cut your toenails straight across to prevent injury to the skin at the corners of the toenail. Do not cut your nails in a curved shape.  Keep your feet clean and dry to help prevent infection. Medicines  Take over-the-counter and prescription medicines only as told by your health care provider.  If you were prescribed an antibiotic, take it as told by your health care provider. Do not stop taking the antibiotic even if you start to feel better. Activity  Return to your normal activities as told by your health care provider. Ask your health care provider what activities are safe for you.  Avoid activities that cause pain. General instructions  If your health care provider told you to use crutches to help you move around, use them   as instructed.  Keep all follow-up visits as told by your health care provider. This is important. Contact a health care provider if:  You have more redness, swelling, pain, or other symptoms that do not improve with treatment.  You have fluid, blood, or pus coming from your toenail. Get help right away if:  You have a red streak on your skin that starts at your foot and spreads up your leg.  You have a fever. Summary  An ingrown toenail occurs when the corner or sides of a toenail grow into the surrounding  skin. This causes discomfort and pain. The big toe is most commonly affected, but any of the toes can be affected.  If an ingrown toenail is not treated, it can become infected.  Fluid or pus draining from your toenail is a sign of infection. Your health care provider may need to drain it. You may be given antibiotics to treat the infection.  Trimming your toenails regularly and properly can help you prevent an ingrown toenail. This information is not intended to replace advice given to you by your health care provider. Make sure you discuss any questions you have with your health care provider. Document Revised: 10/19/2018 Document Reviewed: 03/15/2017 Elsevier Patient Education  2020 Else[employer] Inc.  

## 2019-12-04 NOTE — Progress Notes (Signed)
Subjective:  Sophia Suarez presents today s/p temporary total nail avulsion R hallux.  Patient states she has been following post procedure instructions for the most part. States she may have skipped soaks a couple of times. Denies any redness, pus or pain. No fever, chills, night sweats, nausea or vomiting.  Patient also complaining about right 3rd digit discomfort. Denies any trauma to digit.  Objective: There were no vitals filed for this visit.  79 y.o. Caucasian female WD, WN IN NAD. AAO X 3.  Neurovascular status unchanged b/l. Capillary refill time to digits immediate b/l. Palpable DP pulses b/l. Palpable PT pulses b/l. Pedal hair present b/l. Skin temperature gradient within normal limits b/l.  Pedal skin with normal turgor, texture and tone bilaterally. No open wounds bilaterally. No interdigital macerations bilaterally. Procedure site healing. Still some residual granulation tissue needing to heal. No erythema, no edema, no drainage, no flocculence.  Incurvated nailplate right 3rd digit with tenderness to palpation. No erythema, no edema, no drainage noted.  Normal muscle strength 5/5 to all lower extremity muscle groups bilaterally. No pain crepitus or joint limitation noted with ROM b/l. Hammertoes noted to the 2-5 bilaterally.  Protective sensation intact 5/5 intact bilaterally with 10g monofilament b/l. Vibratory sensation intact b/l. Proprioception intact bilaterally.  Assessment: Status post temporary total nail avulsion right hallux healing well  Ingrown nail right 3rd digit  Plan:  -Examined patient. -Continue epsom salt soaks for an additional week, then stop. After week of soaks, she is to apply antibiotic ointment to right great toe nailbed. She may let toe get air when she is sitting up watching televeision. -Offending nail border debrided and curretaged right 3rd digit. Border cleansed with alcohol and triple antibiotic applied. She will be soaking her right  foot for right great toe. After soaks, she may apply antibiotic ointment to digit. -Patient to continue soft, supportive shoe gear daily. -Patient to report any pedal injuries to medical professional immediately. -Patient/POA to call should there be question/concern in the interim.  Return in about 7 weeks (around 01/15/2020) for nail trim.

## 2019-12-30 ENCOUNTER — Other Ambulatory Visit: Payer: Self-pay | Admitting: Cardiovascular Disease

## 2019-12-30 NOTE — Progress Notes (Signed)
Patient ID: Sophia Suarez, female   DOB: 1941/03/09, 79 y.o.   MRN: [id number]   79 y.o. first seen by me 5/14 . Has had TIAls with borderline carotid lesion. Sees Dr Marjory Lies as well. Reviewed CTA from 4/26 and RICA has a ? Of 55-60% stenosis not typically bad enough to operate on.   No history of CAD. Active with no chest pain. Long standing murmur.   Echo 09/20/18 mild AS mean gradient 14 mmHg normal EF Carotid 08/15/17 40-59% RICA Has been on chronic plavix per neurology   Had left TKR [facility] 2019 March 04/23/18 Right Parotid mass removal Rosen  Right nail avulsion hallux 11/27/19   Left > right LE edema Has had another mass/infection on right side of neck been on doxycycline and seeing DR Hall/Rosen  He dog Bella passed away has thought about getting another but wants her neck issues To be resolved   ROS: Denies fever, malais, weight loss, blurry vision, decreased visual acuity, cough, sputum, SOB, hemoptysis, pleuritic pain, palpitaitons, heartburn, abdominal pain, melena, lower extremity edema, claudication, or rash.  All other systems reviewed and negative  General: BP 118/86   Pulse 67   Ht 4\' 10"  (1.473 m)   Wt 170 lb (77.1 kg)   SpO2 98%   BMI 35.53 kg/m  Affect appropriate Healthy:  appears stated age HEENT: post right parotid excision with right neck bandage  Neck supple with no adenopathy JVP normal no bruits no thyromegaly Lungs clear with no wheezing and good diaphragmatic motion Heart:  S1/S2 mild AS  murmur, no rub, gallop or click PMI normal Abdomen: benighn, BS positve, no tenderness, no AAA no bruit.  No HSM or HJR Distal pulses intact with no bruits No edema Neuro non-focal Skin warm and dry Post left TKR     Current Outpatient Medications  Medication Sig Dispense Refill  . alendronate (FOSAMAX) 70 MG tablet Take 70 mg by mouth every Tuesday. Take with a full glass of water on an empty stomach.     Wednesday aspirin EC 81 MG tablet Take 81 mg by mouth at  bedtime.    . Biotin 1000 MCG tablet Take 2,000 mcg by mouth daily.    . brimonidine (ALPHAGAN) 0.2 % ophthalmic solution Place 1 drop into both eyes 2 (two) times daily.    . Calcium Carb-Cholecalciferol (CALCIUM PLUS VITAMIN D3 PO) Take 1 tablet by mouth daily.    . cetirizine (ZYRTEC ALLERGY) 10 MG tablet Take 10 mg by mouth at bedtime.    . clobetasol ointment (TEMOVATE) 0.05 %     . clopidogrel (PLAVIX) 75 MG tablet TAKE 1 TABLET (75 MG TOTAL) BY MOUTH DAILY. 30 tablet 0  . Coenzyme Q10-Vitamin E (QUNOL ULTRA COQ10 PO) Take 1 capsule by mouth daily.    . diphenhydrAMINE (BENADRYL) 25 mg capsule Take 25 mg by mouth daily.     Marland Kitchen docusate sodium (COLACE) 100 MG capsule Take 100 mg by mouth at bedtime as needed for mild constipation.    . dorzolamide (TRUSOPT) 2 % ophthalmic solution Place 1 drop into both eyes 2 (two) times daily.     . dorzolamide-timolol (COSOPT) 22.3-6.8 MG/ML ophthalmic solution     . doxycycline (DORYX) 100 MG EC tablet Take 100 mg by mouth 2 (two) times daily. For 14 days    . Glucos-Chond-Hyal Ac-Ca Fructo (MOVE FREE JOINT HEALTH ADVANCE) TABS Take 2 tablets by mouth daily.    . hydrochlorothiazide (MICROZIDE) 12.5 MG capsule Take 1 capsule (  12.5 mg total) by mouth daily. Please keep upcoming appt in June with Dr. Johnsie Cancel before anymore refills. Final Attempt 90 capsule 0  . hydrOXYzine (ATARAX/VISTARIL) 25 MG tablet Take 25 mg by mouth at bedtime as needed.    Marland Kitchen losartan (COZAAR) 100 MG tablet Take 100 mg by mouth daily.     . Multiple Vitamin (MULTIVITAMIN WITH MINERALS) TABS tablet Take 1 tablet by mouth daily. Centrum Silver    . mupirocin cream (BACTROBAN) 2 % Apply 1 application topically 3 (three) times daily. PRN    . Omega-3 Fatty Acids (FISH OIL) 1000 MG CAPS Take 2,000 mg by mouth daily.    . pantoprazole (PROTONIX) 20 MG tablet TAKE 1 TABLET ONCE A DAY APPROXIMATELY 30 MINUTES BEFORE BREAKFAST    . Probiotic Product (PROBIOTIC DAILY PO) Take 1 capsule by  mouth daily.    . simvastatin (ZOCOR) 40 MG tablet Take 40 mg by mouth every evening.    . sodium chloride (OCEAN) 0.65 % SOLN nasal spray Place 1 spray into both nostrils 4 (four) times daily as needed for congestion.    . timolol (TIMOPTIC) 0.5 % ophthalmic solution Apply to eye.    Marland Kitchen tiZANidine (ZANAFLEX) 2 MG tablet Take 1 tablet (2 mg total) by mouth every 6 (six) hours as needed for muscle spasms. 60 tablet 0  . triamcinolone cream (KENALOG) 0.1 % Apply 1 application topically 2 (two) times daily as needed (for skin irriation/ rash/lichenoid dermatitis).    . vitamin B-12 (CYANOCOBALAMIN) 1000 MCG tablet Take 1,000 mcg by mouth daily.     No current facility-administered medications for this visit.    Allergies  Clindamycin/lincomycin, Other, Septra [sulfamethoxazole-trimethoprim], Sulfa antibiotics, Melatonin, Bee pollen, Codeine, Horse-derived products, Latex, Mold extract [trichophyton], and Pollen extract  Electrocardiogram:  01/01/20 SR rate 67 normal   Assessment and Plan AS: mild f/u echo for AS  09/20/18 RG 60-65% mean gradient 13 peak 24 mmHg DVI 0.48  Carotid   40-59% RICA stenosis. 08/15/17 f/u duplex ordered  ASA/Plavix/Statin   HTN: Better with diuretic  continue ARB  Discussed diet and weight loss   ENT: post right parotid mass removal F/U Constance Holster new lesion in right cervical area f/u Rosen/Hall Has finished course of doxycycline   Ortho:  Post left TKR f/u [facility]   Edema;  Dependant ? Component lymph edema increase HcTZ 25 mg daily BMET in 3 weeks    Echo for AS Carotid known disease Increase HCTZ 25 mg BMET 3 weeks  F/U in a year if stable   [employer]

## 2020-01-01 ENCOUNTER — Other Ambulatory Visit: Payer: Self-pay

## 2020-01-01 ENCOUNTER — Encounter: Payer: Self-pay | Admitting: Cardiovascular Disease

## 2020-01-01 ENCOUNTER — Ambulatory Visit: Payer: Medicare Other | Admitting: Cardiovascular Disease

## 2020-01-01 VITALS — BP 118/86 | HR 67 | Ht <= 58 in | Wt 170.0 lb

## 2020-01-01 DIAGNOSIS — I350 Nonrheumatic aortic (valve) stenosis: Secondary | ICD-10-CM | POA: Diagnosis not present

## 2020-01-01 DIAGNOSIS — I779 Disorder of arteries and arterioles, unspecified: Secondary | ICD-10-CM | POA: Diagnosis not present

## 2020-01-01 DIAGNOSIS — Z79899 Other long term (current) drug therapy: Secondary | ICD-10-CM

## 2020-01-01 MED ORDER — HYDROCHLOROTHIAZIDE 25 MG PO TABS: 25.0000 mg | ORAL_TABLET | Freq: Every day | ORAL | 3 refills | Status: DC

## 2020-01-01 NOTE — Patient Instructions (Addendum)
Medication Instructions:  Your physician has recommended you make the following change in your medication:  1-Increase Hydrochlorothiazide 25 mg by mouth daily.  *If you need a refill on your cardiac medications before your next appointment, please call your pharmacy*   Lab Work: Your physician recommends that you return for lab work in: 3 weeks for [employer].  If you have labs (blood work) drawn today and your tests are completely normal, you will receive your results only by: Marland Kitchen MyChart Message (if you have MyChart) OR . A paper copy in the mail If you have any lab test that is abnormal or we need to change your treatment, we will call you to review the results.   Testing/Procedures: Your physician has requested that you have a carotid duplex. This test is an ultrasound of the carotid arteries in your neck. It looks at blood flow through these arteries that supply the brain with blood. Allow one hour for this exam. There are no restrictions or special instructions.  Your physician has requested that you have an echocardiogram. Echocardiography is a painless test that uses sound waves to create images of your heart. It provides your doctor with information about the size and shape of your heart and how well your heart's chambers and valves are working. This procedure takes approximately one hour. There are no restrictions for this procedure.  Follow-Up: At [facility], you and your health needs are our priority.  As part of our continuing mission to provide you with exceptional heart care, we have created designated Provider Care Teams.  These Care Teams include your primary Cardiologist (physician) and Advanced Practice Providers (APPs -  Physician Assistants and Nurse Practitioners) who all work together to provide you with the care you need, when you need it.  We recommend signing up for the patient portal called "MyChart".  Sign up information is provided on this After Visit Summary.   MyChart is used to connect with patients for Virtual Visits (Telemedicine).  Patients are able to view lab/test results, encounter notes, upcoming appointments, etc.  Non-urgent messages can be sent to your provider as well.   To learn more about what you can do with MyChart, go to [email or web address].    Your next appointment:   1 year(s)  The format for your next appointment:   In Person  Provider:   You may see Dr. Eden Emms or one of the following Advanced Practice Providers on your designated Care Team:    Norma Fredrickson, NP  Nada Boozer, NP  Georgie Chard, NP

## 2020-01-02 DIAGNOSIS — L089 Local infection of the skin and subcutaneous tissue, unspecified: Secondary | ICD-10-CM | POA: Insufficient documentation

## 2020-01-23 ENCOUNTER — Other Ambulatory Visit: Payer: Self-pay

## 2020-01-23 ENCOUNTER — Ambulatory Visit (HOSPITAL_BASED_OUTPATIENT_CLINIC_OR_DEPARTMENT_OTHER): Payer: Medicare Other

## 2020-01-23 ENCOUNTER — Other Ambulatory Visit: Payer: Medicare Other | Admitting: *Deleted

## 2020-01-23 DIAGNOSIS — I350 Nonrheumatic aortic (valve) stenosis: Secondary | ICD-10-CM

## 2020-01-23 DIAGNOSIS — I779 Disorder of arteries and arterioles, unspecified: Secondary | ICD-10-CM | POA: Diagnosis present

## 2020-01-23 DIAGNOSIS — Z79899 Other long term (current) drug therapy: Secondary | ICD-10-CM

## 2020-01-23 LAB — BASIC METABOLIC PANEL
BUN/Creatinine Ratio: 18 (ref 12–28)
BUN: 17 mg/dL (ref 8–27)
CO2: 23 mmol/L (ref 20–29)
Calcium: 9.8 mg/dL (ref 8.7–10.3)
Chloride: 99 mmol/L (ref 96–106)
Creatinine, Ser: 0.96 mg/dL (ref 0.57–1.00)
GFR calc Af Amer: 66 mL/min/{1.73_m2} (ref >59)
GFR calc non Af Amer: 57 mL/min/{1.73_m2} — ABNORMAL LOW (ref >59)
Glucose: 119 mg/dL — ABNORMAL HIGH (ref 65–99)
Potassium: 4.2 mmol/L (ref 3.5–5.2)
Sodium: 136 mmol/L (ref 134–144)

## 2020-01-23 LAB — ECHOCARDIOGRAM COMPLETE
AR max vel: 1.29 cm2
AV Area VTI: 1.28 cm2
AV Area mean vel: 1.58 cm2
AV Mean grad: 12 mmHg
AV Peak grad: 23.2 mmHg
Ao pk vel: 2.41 m/s
Area-P 1/2: 2.48 cm2
S' Lateral: 2.4 cm

## 2020-01-24 ENCOUNTER — Other Ambulatory Visit (HOSPITAL_COMMUNITY): Payer: Self-pay | Admitting: Cardiovascular Disease

## 2020-01-24 ENCOUNTER — Ambulatory Visit (HOSPITAL_COMMUNITY)
Admission: RE | Admit: 2020-01-24 | Discharge: 2020-01-24 | Disposition: A | Discharge status: Home / Self Care | Payer: Medicare Other | Source: Ambulatory Visit | Attending: Cardiology | Admitting: Cardiology

## 2020-01-24 DIAGNOSIS — I350 Nonrheumatic aortic (valve) stenosis: Secondary | ICD-10-CM

## 2020-01-24 DIAGNOSIS — I779 Disorder of arteries and arterioles, unspecified: Secondary | ICD-10-CM

## 2020-01-24 DIAGNOSIS — I6523 Occlusion and stenosis of bilateral carotid arteries: Secondary | ICD-10-CM

## 2020-02-03 ENCOUNTER — Encounter: Payer: Self-pay | Admitting: Podiatry

## 2020-02-03 ENCOUNTER — Ambulatory Visit: Payer: Medicare Other | Admitting: Podiatry

## 2020-02-03 ENCOUNTER — Other Ambulatory Visit: Payer: Self-pay

## 2020-02-03 DIAGNOSIS — B351 Tinea unguium: Secondary | ICD-10-CM | POA: Diagnosis not present

## 2020-02-03 DIAGNOSIS — M79675 Pain in left toe(s): Secondary | ICD-10-CM

## 2020-02-03 DIAGNOSIS — L600 Ingrowing nail: Secondary | ICD-10-CM | POA: Diagnosis not present

## 2020-02-03 DIAGNOSIS — E119 Type 2 diabetes mellitus without complications: Secondary | ICD-10-CM | POA: Diagnosis not present

## 2020-02-03 DIAGNOSIS — M79674 Pain in right toe(s): Secondary | ICD-10-CM | POA: Diagnosis not present

## 2020-02-03 NOTE — Patient Instructions (Signed)
EPSOM SALT FOOT SOAK INSTRUCTIONS  *IF YOU HAVE BEEN PRESCRIBED ANTIBIOTICS, TAKE AS INSTRUCTED UNTIL ALL ARE GONE*  Shopping List:  A. Plain epsom salt (not scented) B. Neosporin Cream/Ointment or Bacitracin Cream/Ointment (or prescribed antiobiotic drops/cream/ointment) C. 1-inch fabric band-aids  1.  Place 1/4 cup of epsom salts in 2 quarts of warm tap water. IF YOU ARE DIABETIC, OR HAVE NEUROPATHY, CHECK THE TEMPERATURE OF THE WATER WITH YOUR ELBOW.  2.  Submerge your foot/feet in the solution and soak for 10-15 minutes.      3.  Next, remove your foot/feet from solution, blot dry the affected area.    4.  Apply light amount of antibiotic cream/ointment and cover with fabric band-aid .  5.  This soak should be done once a day for 10 days.   6.  Monitor for any signs/symptoms of infection such as redness, swelling, odor, drainage, increased pain, or non-healing of digit.   7.  Please do not hesitate to call the office and speak to a Nurse or Doctor if you have questions.   8.  If you experience fever, chills, nightsweats, nausea or vomiting with worsening of digit, please go to the emergency room.   

## 2020-02-04 DIAGNOSIS — J3489 Other specified disorders of nose and nasal sinuses: Secondary | ICD-10-CM | POA: Insufficient documentation

## 2020-02-04 NOTE — Progress Notes (Signed)
Subjective: Sophia Suarez presents to clinic today for painful thick toenails that are difficult to trim. Pain interferes with ambulation. Aggravating factors include wearing enclosed shoe gear. Pain is relieved with periodic professional debridement.  Patient also presents with cc of painful, ingrown toenail of bilateral borders L hallux.  Pain has been present off and on for months.  Attempted treatments include: debridements/curretage of offending nail borders.  Past Medical History:  Diagnosis Date  . Allergic rhinitis, seasonal   . Anemia   . Arthritis   . Asthma   . Carotid artery occlusion   . Chronic kidney disease   . Chronic pain   . Complication of anesthesia    makes her hyper  . Diabetes mellitus    no medication  diet controlled  . Diverticulitis   . Dyspnea    with exertion  . GERD (gastroesophageal reflux disease)   . Heart murmur   . Hyperlipidemia   . Hypertension   . Lumbago   . Panic attacks   . Stroke (HCC)   . Urinary incontinence      Past Surgical History:  Procedure Laterality Date  . ABDOMINAL HYSTERECTOMY  1973   Partial  . APPENDECTOMY  1958  . BIOPSY THYROID  Dec 05, 2013   Tumor- Benign  . COLECTOMY  2003   extent uncertain  . EYE SURGERY Right October 10, 2014   Cataract  . EYE SURGERY Left  October 17, 2014   Cataract  . KNEE ARTHROSCOPY Left   . MASS EXCISION Right 04/23/2018   Procedure: EXCISION OF SKIN MASS WITH PRIMARY CLOSURE;  Surgeon: Serena Colonel, MD;  Location: [facility];  Service: ENT;  Laterality: Right;  . OVARIAN CYST SURGERY    . PAROTIDECTOMY Right 04/23/2018   Procedure: Superficial Parotidectomy with nerve dissection;  Surgeon: Serena Colonel, MD;  Location:  SURGERY CENTER;  Service: ENT;  Laterality: Right;  . PARTIAL HYSTERECTOMY    . TOTAL KNEE ARTHROPLASTY Left 09/15/2017   Procedure: TOTAL KNEE ARTHROPLASTY;  Surgeon: Gean Birchwood, MD;  Location: [facility] OR;  Service: Orthopedics;   Laterality: Left;     Current Outpatient Medications on File Prior to Visit  Medication Sig Dispense Refill  . alendronate (FOSAMAX) 70 MG tablet Take 70 mg by mouth every Tuesday. Take with a full glass of water on an empty stomach.     Marland Kitchen aspirin EC 81 MG tablet Take 81 mg by mouth at bedtime.    . Biotin 1000 MCG tablet Take 2,000 mcg by mouth daily.    . brimonidine (ALPHAGAN) 0.2 % ophthalmic solution Place 1 drop into both eyes 2 (two) times daily.    . Calcium Carb-Cholecalciferol (CALCIUM PLUS VITAMIN D3 PO) Take 1 tablet by mouth daily.    . cetirizine (ZYRTEC ALLERGY) 10 MG tablet Take 10 mg by mouth at bedtime.    . clobetasol ointment (TEMOVATE) 0.05 %     . clopidogrel (PLAVIX) 75 MG tablet TAKE 1 TABLET (75 MG TOTAL) BY MOUTH DAILY. 30 tablet 0  . Coenzyme Q10-Vitamin E (QUNOL ULTRA COQ10 PO) Take 1 capsule by mouth daily.    . diphenhydrAMINE (BENADRYL) 25 mg capsule Take 25 mg by mouth daily.     Marland Kitchen docusate sodium (COLACE) 100 MG capsule Take 100 mg by mouth at bedtime as needed for mild constipation.    . dorzolamide (TRUSOPT) 2 % ophthalmic solution Place 1 drop into both eyes 2 (two) times daily.     Marland Kitchen  dorzolamide-timolol (COSOPT) 22.3-6.8 MG/ML ophthalmic solution     . doxycycline (DORYX) 100 MG EC tablet Take 100 mg by mouth 2 (two) times daily. For 14 days    . Glucos-Chond-Hyal Ac-Ca Fructo (MOVE FREE JOINT HEALTH ADVANCE) TABS Take 2 tablets by mouth daily.    . hydrochlorothiazide (HYDRODIURIL) 25 MG tablet Take 1 tablet (25 mg total) by mouth daily. 90 tablet 3  . hydrOXYzine (ATARAX/VISTARIL) 25 MG tablet Take 25 mg by mouth at bedtime as needed.    Marland Kitchen losartan (COZAAR) 100 MG tablet Take 100 mg by mouth daily.     . Multiple Vitamin (MULTIVITAMIN WITH MINERALS) TABS tablet Take 1 tablet by mouth daily. Centrum Silver    . mupirocin cream (BACTROBAN) 2 % Apply 1 application topically 3 (three) times daily. PRN    . mupirocin ointment (BACTROBAN) 2 % Apply topically  3 (three) times daily as needed.    . Omega-3 Fatty Acids (FISH OIL) 1000 MG CAPS Take 2,000 mg by mouth daily.    . pantoprazole (PROTONIX) 20 MG tablet TAKE 1 TABLET ONCE A DAY APPROXIMATELY 30 MINUTES BEFORE BREAKFAST    . Probiotic Product (PROBIOTIC DAILY PO) Take 1 capsule by mouth daily.    . simvastatin (ZOCOR) 40 MG tablet Take 40 mg by mouth every evening.    . sodium chloride (OCEAN) 0.65 % SOLN nasal spray Place 1 spray into both nostrils 4 (four) times daily as needed for congestion.    . timolol (TIMOPTIC) 0.5 % ophthalmic solution Apply to eye.    Marland Kitchen tiZANidine (ZANAFLEX) 2 MG tablet Take 1 tablet (2 mg total) by mouth every 6 (six) hours as needed for muscle spasms. 60 tablet 0  . triamcinolone cream (KENALOG) 0.1 % Apply 1 application topically 2 (two) times daily as needed (for skin irriation/ rash/lichenoid dermatitis).    . vitamin B-12 (CYANOCOBALAMIN) 1000 MCG tablet Take 1,000 mcg by mouth daily.     No current facility-administered medications on file prior to visit.     Allergies  Allergen Reactions  . Clindamycin/Lincomycin Anaphylaxis  . Other Anaphylaxis and Itching  . Septra [Sulfamethoxazole-Trimethoprim] Anaphylaxis  . Sulfa Antibiotics Anaphylaxis  . Melatonin Other (See Comments) and Hypertension    Over active   . Bee Pollen Other (See Comments)    Congestion.  . Codeine Other (See Comments)    Unknown  . Horse-Derived Products Itching  . Latex Hives  . Mold Extract [Trichophyton] Other (See Comments)    Congestion.  . Pollen Extract Other (See Comments)    Congestion.     Family History  Problem Relation Age of Onset  . Aneurysm Mother        aortic  . Coronary artery disease Mother        CABG  . Cancer Mother   . Diabetes Mother        Amputation  . Heart disease Mother        AAA  . Hypertension Mother   . Hyperlipidemia Mother   . Cancer Father        Brain  . Hypertension Father   . Deep vein thrombosis Father   . Heart disease  Father   . Hyperlipidemia Father   . Hypertension Brother   . Hypertension Son     Social History   Occupational History  . Occupation: Retired    Comment: [employer].  Tobacco Use  . Smoking status: Never Smoker  . Smokeless tobacco: Never Used  Vaping Use  .  Vaping Use: Never used  Substance and Sexual Activity  . Alcohol use: Yes    Alcohol/week: 0.0 - 1.0 standard drinks    Comment: occasionally; 1 drink monthly  . Drug use: No  . Sexual activity: Not on file      Immunization History  Administered Date(s) Administered  . Influenza, High Dose Seasonal PF 04/11/2018  . Td 11/28/2017   Objective: There were no vitals filed for this visit.  Patient is a pleasant 79 y.o. female  IN NAD. AAO X 3.  Vascular Examination: Capillary refill time to digits immediate b/l. Palpable pedal pulses b/l LE. Pedal hair present. Lower extremity skin temperature gradient within normal limits.  Dermatological Examination: Pedal skin with normal turgor, texture and tone bilaterally. No open wounds bilaterally. No interdigital macerations bilaterally. Toenails 2-5 bilaterally elongated, discolored, dystrophic, thickened, and crumbly with subungual debris and tenderness to dorsal palpation. Anonychia noted R hallux. Nailbed(s) epithelialized.   Musculoskeletal Examination: Normal muscle strength 5/5 to all lower extremity muscle groups bilaterally. No pain crepitus or joint limitation noted with ROM b/l. Hammertoes noted to the 2-5 bilaterally.  Neurological Examination: Protective sensation intact 5/5 intact bilaterally with 10g monofilament b/l. Vibratory sensation intact b/l. Proprioception intact bilaterally.  Assessment: Pain due to onychomycosis of toenails of both feet  Ingrown toenail without infection  Controlled type 2 diabetes mellitus without complication, without long-term current use of insulin (HCC)  Plan: 1. Discussed diagnosis of with treatment  options. 2. Patient/POA agreed to have temporary /total nail avulsion of left hallux. 3. Consent form signed and in chart 4. Prepped L hallux with Betadine solution. 5. Local injection of 3 cc's of 0.5% Bupivicaine plain administered via hallux block. Offending nail plate freed utilizing elevator. Offending nail plate removed with hemostat. Border cleansed with alcohol. Light bleeding controlled with Lumicain Hemostatic Solution. Light dressng applied. 6. Discused post-procedure instructions of epsom salt warm water soaks and dispensed written handout to patient/POA. 7. Remaining toenails x 8 debrided in length and girth without complication. 8. Patient/POA related understanding of post-procedure instructions 9. Follow up 2 weeks. Call office if there is increased redness, swelling, drainage, odor or pain.  Return in about 2 weeks (around 02/17/2020) for left nail check.

## 2020-02-21 ENCOUNTER — Encounter: Payer: Self-pay | Admitting: Podiatry

## 2020-02-21 ENCOUNTER — Other Ambulatory Visit: Payer: Self-pay

## 2020-02-21 ENCOUNTER — Ambulatory Visit: Payer: Medicare Other | Admitting: Podiatry

## 2020-02-21 DIAGNOSIS — Z9889 Other specified postprocedural states: Secondary | ICD-10-CM | POA: Diagnosis not present

## 2020-02-21 DIAGNOSIS — M79675 Pain in left toe(s): Secondary | ICD-10-CM | POA: Diagnosis not present

## 2020-02-21 DIAGNOSIS — L600 Ingrowing nail: Secondary | ICD-10-CM

## 2020-02-22 NOTE — Progress Notes (Signed)
Subjective:  Sophia Suarez presents today s/p temporary total nail avulsion L hallux.  Patient states she has been following post procedure instructions. States she stopped using band-aid because toe was too moist.   Objective: There were no vitals filed for this visit.  79 y.o. female obese in NAD.Marland Kitchen AAO X 3.  Capillary refill time to digits immediate b/l. Palpable pedal pulses b/l LE. Pedal hair present. Lower extremity skin temperature gradient within normal limits.  Pedal skin with normal turgor, texture and tone bilaterally. No open wounds bilaterally. No interdigital macerations bilaterally. Procedure site of L hallux noted to be healing as expected with no erythema, no edema, no drainage, no purulence.  Normal muscle strength 5/5 to all lower extremity muscle groups bilaterally. No pain crepitus or joint limitation noted with ROM b/l. Hammertoes noted to the 2-5 bilaterally.  Protective sensation intact 5/5 intact bilaterally with 10g monofilament b/l. Vibratory sensation intact b/l. Proprioception intact bilaterally.  Assessment: s/p temporary total nail avulsion L hallux  Plan: -Examined patient. -Nailbed(s) left hallux gently cleansed of dried serosanguinous drainage. Digit(s) cleansed with alcohol. Light dressing applied. -Patient to report any pedal injuries to medical professional immediately. -Patient instructed to discontinue epsom salt soaks and start applying topical antibiotic to nailbed once daily for an additional week, then stop.  May let toe get air when sitting up watching television. If she goes out, apply light dressing to digit for protection. -Patient to continue soft, supportive shoe gear daily. -Patient/POA to call should there be question/concern in the interim.  Return in about 7 weeks (around 04/10/2020).

## 2020-04-10 ENCOUNTER — Ambulatory Visit (INDEPENDENT_AMBULATORY_CARE_PROVIDER_SITE_OTHER): Payer: Medicare Other | Admitting: Podiatry

## 2020-04-10 ENCOUNTER — Encounter: Payer: Self-pay | Admitting: Podiatry

## 2020-04-10 ENCOUNTER — Other Ambulatory Visit: Payer: Self-pay

## 2020-04-10 DIAGNOSIS — E119 Type 2 diabetes mellitus without complications: Secondary | ICD-10-CM

## 2020-04-10 DIAGNOSIS — M79674 Pain in right toe(s): Secondary | ICD-10-CM

## 2020-04-10 DIAGNOSIS — M79675 Pain in left toe(s): Secondary | ICD-10-CM | POA: Diagnosis not present

## 2020-04-10 DIAGNOSIS — B351 Tinea unguium: Secondary | ICD-10-CM

## 2020-04-10 DIAGNOSIS — Z9889 Other specified postprocedural states: Secondary | ICD-10-CM

## 2020-04-10 DIAGNOSIS — L84 Corns and callosities: Secondary | ICD-10-CM

## 2020-04-10 NOTE — Patient Instructions (Signed)
Equate Cocoa Divine Body Oil

## 2020-04-14 NOTE — Progress Notes (Signed)
Subjective: AERICA Suarez presents today for follow up of preventative diabetic foot care, painful mycotic nails b/l that are difficult to trim. Pain interferes with ambulation. Aggravating factors include wearing enclosed shoe gear. Pain is relieved with periodic professional debridement and painful corn(s) distal tip right 3rd digit  which interfere(s) with ambulation. Aggravating factors include wearing enclosed shoe gear. Pain is relieved with periodic professional debridement.   She would like to know what she could use for her dry skin.   PCP is Dr. Johnn Suarez and last visit was 03/10/2020.  She is s/p temporary total nail avulsion left hallux. She states toe has hard skin on it and is sore when wearing some shoes.   Allergies  Allergen Reactions  . Clindamycin/Lincomycin Anaphylaxis  . Other Anaphylaxis and Itching  . Septra [Sulfamethoxazole-Trimethoprim] Anaphylaxis  . Sulfa Antibiotics Anaphylaxis  . Melatonin Other (See Comments) and Hypertension    Over active   . Bee Pollen Other (See Comments)    Congestion.  . Codeine Other (See Comments)    Unknown  . Horse-Derived Products Itching  . Latex Hives  . Mold Extract [Trichophyton] Other (See Comments)    Congestion.  . Pollen Extract Other (See Comments)    Congestion.     Objective: There were no vitals filed for this visit.  Vascular Examination:  Capillary refill time to digits immediate b/l, palpable DP pulses b/l, palpable PT pulses b/l, pedal hair present b/l and skin temperature gradient within normal limits b/l  Dermatological Examination: Pedal skin with normal turgor, texture and tone bilaterally. No open wounds bilaterally. Toenails 1-5 right, L 2nd toe, L 3rd toe, L 4th toe and L 5th toe elongated, discolored, dystrophic, thickened, and crumbly with subungual debris and tenderness to dorsal palpation. Hyperkeratotic lesion(s) R 3rd toe.  No erythema, no edema, no drainage, no flocculence.   Left  hallux with dried serosanguinous drainage on nailbed. No erythema, no edema, no drainage, no fluctuance.  Musculoskeletal: Normal muscle strength 5/5 to all lower extremity muscle groups bilaterally, no pain crepitus or joint limitation noted with ROM b/l and hammertoes noted to the  2-5 bilaterally  Neurological: Protective sensation intact 5/5 intact bilaterally with 10g monofilament b/l and vibratory sensation intact b/l  Assessment: 1. Pain due to onychomycosis of toenails of both feet   2. Corns   3. Status post nail surgery   4. Controlled type 2 diabetes mellitus without complication, without long-term current use of insulin (HCC)    Plan: -Continue diabetic foot care principles. -Debrided nonviable skin from left hallux nailbed. Applied TAO. She was instructed to apply Equate Body Oil to nailbed once daily. -For dry skin, apply Equate Cocoa Divine Body Oil once daily. -Toenails 1-5 right, L 2nd toe, L 3rd toe, L 4th toe and L 5th toe debrided in length and girth without iatrogenic bleeding with sterile nail nipper and dremel.  -Corn(s) R 3rd toe pared utilizing sterile scalpel blade without complication or incident. Total number debrided=1. -Patient to report any pedal injuries to medical professional immediately. -Patient to continue soft, supportive shoe gear daily. -Patient/POA to call should there be question/concern in the interim.  Return in about 3 months (around 07/11/2020).

## 2020-07-14 ENCOUNTER — Encounter: Payer: Self-pay | Admitting: Podiatry

## 2020-07-14 ENCOUNTER — Other Ambulatory Visit: Payer: Self-pay

## 2020-07-14 ENCOUNTER — Ambulatory Visit (INDEPENDENT_AMBULATORY_CARE_PROVIDER_SITE_OTHER): Payer: Medicare Other | Admitting: Podiatry

## 2020-07-14 DIAGNOSIS — B351 Tinea unguium: Secondary | ICD-10-CM

## 2020-07-14 DIAGNOSIS — E119 Type 2 diabetes mellitus without complications: Secondary | ICD-10-CM

## 2020-07-14 DIAGNOSIS — L84 Corns and callosities: Secondary | ICD-10-CM | POA: Diagnosis not present

## 2020-07-14 DIAGNOSIS — M79675 Pain in left toe(s): Secondary | ICD-10-CM

## 2020-07-14 DIAGNOSIS — M79674 Pain in right toe(s): Secondary | ICD-10-CM | POA: Diagnosis not present

## 2020-07-14 DIAGNOSIS — M2042 Other hammer toe(s) (acquired), left foot: Secondary | ICD-10-CM

## 2020-07-14 DIAGNOSIS — M2041 Other hammer toe(s) (acquired), right foot: Secondary | ICD-10-CM

## 2020-07-14 NOTE — Progress Notes (Signed)
Subjective: Sophia Suarez presents today for follow up of preventative diabetic foot care, painful mycotic nails b/l that are difficult to trim. Pain interferes with ambulation. Aggravating factors include wearing enclosed shoe gear. Pain is relieved with periodic professional debridement and painful corn(s) distal tip right 3rd digit  which interfere(s) with ambulation. Aggravating factors include wearing enclosed shoe gear. Pain is relieved with periodic professional debridement.   PCP is Dr. Johnn Hai and last visit was 03/10/2020.   Allergies  Allergen Reactions  . Clindamycin/Lincomycin Anaphylaxis  . Other Anaphylaxis and Itching  . Septra [Sulfamethoxazole-Trimethoprim] Anaphylaxis  . Sulfa Antibiotics Anaphylaxis  . Melatonin Other (See Comments) and Hypertension    Over active   . Bee Pollen Other (See Comments)    Congestion.  . Codeine Other (See Comments)    Unknown  . Horse-Derived Products Itching  . Latex Hives  . Mold Extract [Trichophyton] Other (See Comments)    Congestion.  . Pollen Extract Other (See Comments)    Congestion.     Objective: There were no vitals filed for this visit.  Vascular Examination:  Capillary refill time to digits immediate b/l, palpable DP pulses b/l, palpable PT pulses b/l, pedal hair present b/l and skin temperature gradient within normal limits b/l  Dermatological Examination: Pedal skin with normal turgor, texture and tone bilaterally. No open wounds bilaterally. Toenails 1-5 right, L 2nd toe, L 3rd toe, L 4th toe and L 5th toe elongated, discolored, dystrophic, thickened, and crumbly with subungual debris and tenderness to dorsal palpation. Hyperkeratotic lesion(s) R 3rd toe.  No erythema, no edema, no drainage, no flocculence.      Musculoskeletal: Normal muscle strength 5/5 to all lower extremity muscle groups bilaterally, no pain crepitus or joint limitation noted with ROM b/l and hammertoes noted to the  2-5  bilaterally  Neurological: Protective sensation intact 5/5 intact bilaterally with 10g monofilament b/l and vibratory sensation intact b/l  Assessment: 1. Pain due to onychomycosis of toenails of both feet   2. Corns   3. Acquired hammertoes of both feet   4. Controlled type 2 diabetes mellitus without complication, without long-term current use of insulin (HCC)    Plan: -Continue diabetic foot care principles. -Debrided nonviable skin from left hallux nailbed. Applied TAO. She was instructed to apply Equate Body Oil to nailbed once daily. -For dry skin, apply Equate Cocoa Divine Body Oil once daily. -Toenails 1-5 right, L 2nd toe, L 3rd toe, L 4th toe and L 5th toe debrided in length and girth without iatrogenic bleeding with sterile nail nipper and dremel.  -Corn(s) R 3rd, right 4th toe pared utilizing sterile scalpel blade without complication or incident. Total number debrided=2. -Discussed flexor tenotomy procedure to correct chronic lesions distal tips of right 3rd and right 4th digits. Will refer her to [name] for consultation.  -Patient to report any pedal injuries to medical professional immediately. -Patient to continue soft, supportive shoe gear daily. -Patient/POA to call should there be question/concern in the interim.  Return in about 3 months (around 10/12/2020).

## 2020-07-29 DIAGNOSIS — N3946 Mixed incontinence: Secondary | ICD-10-CM | POA: Insufficient documentation

## 2020-08-06 ENCOUNTER — Other Ambulatory Visit: Payer: Self-pay

## 2020-08-06 ENCOUNTER — Ambulatory Visit: Payer: Medicare Other | Admitting: Podiatry

## 2020-08-06 DIAGNOSIS — M2041 Other hammer toe(s) (acquired), right foot: Secondary | ICD-10-CM

## 2020-08-06 DIAGNOSIS — M2042 Other hammer toe(s) (acquired), left foot: Secondary | ICD-10-CM | POA: Diagnosis not present

## 2020-08-06 DIAGNOSIS — L84 Corns and callosities: Secondary | ICD-10-CM

## 2020-08-09 ENCOUNTER — Encounter: Payer: Self-pay | Admitting: Podiatry

## 2020-08-09 NOTE — Progress Notes (Signed)
  Subjective:  Patient ID: Sophia Suarez, female    DOB: 08-13-1940,  MRN: [id number]  Chief Complaint  Patient presents with  . Callouses    PT stated that she keeps getting corns on the tip of her toes.    80 y.o. female presents with the above complaint. History confirmed with patient.  She is referred to me by Dr. Eloy End for preulcerative calluses and painful hammertoe deformities  Objective:  Physical Exam: warm, good capillary refill, no trophic changes or ulcerative lesions, normal DP and PT pulses and normal sensory exam.  Bilaterally she has semireducible hammertoe contractures.  The most severe of which are the second and third on the right side and left third with preulcerative calluses the tips.  Dystrophic nail changes noted here as well   Assessment:   1. Hammertoe of left foot   2. Hammertoe of right foot   3. Pre-ulcerative calluses      Plan:  Patient was evaluated and treated and all questions answered.  Hammertoe right second toe, bilateral third toe -Her deformities are semireducible in nature and I think would benefit well from reduction of the deformity with tenotomy -Discussed proceeding with flexor tenotomy procedure. Patient agrees to proceed. -Consent form reviewed and signed. -Proceed with flexor tenotomy as below  Procedure: Flexor Tenotomy Indication for Procedure: toe with semi-reducible hammertoe with distal tip ulceration. Flexor tenotomy indicated to alleviate contracture, reduce pressure, and enhance healing of the ulceration. Location:  Anesthesia: Lidocaine 1% plain; 1.5 mL and Marcaine 0.5% plain; 1.5 mL digital block Instrumentation: 18 g needle  Technique: The toe was anesthetized as above and prepped in the usual fashion. The toe was exsanquinated and a tourniquet was secured at the base of the toe. An 18g needle was then used to percutaneously release the flexor tendon at the plantar surface of the toe with noted release of the  hammertoe deformity. The incision was then dressed with antibiotic ointment and band-aid. Compression splint dressing applied. Patient tolerated the procedure well. Dressing: Dry, sterile, compression dressing. Disposition: Patient tolerated procedure well.    No follow-ups on file.

## 2020-08-10 DIAGNOSIS — L249 Irritant contact dermatitis, unspecified cause: Secondary | ICD-10-CM | POA: Diagnosis not present

## 2020-08-10 DIAGNOSIS — E119 Type 2 diabetes mellitus without complications: Secondary | ICD-10-CM | POA: Diagnosis not present

## 2020-08-11 DIAGNOSIS — R9431 Abnormal electrocardiogram [ECG] [EKG]: Secondary | ICD-10-CM | POA: Diagnosis not present

## 2020-08-27 ENCOUNTER — Other Ambulatory Visit: Payer: Self-pay

## 2020-08-27 ENCOUNTER — Ambulatory Visit: Payer: Medicare Other | Admitting: Podiatry

## 2020-08-27 DIAGNOSIS — M2041 Other hammer toe(s) (acquired), right foot: Secondary | ICD-10-CM

## 2020-08-27 DIAGNOSIS — Z9889 Other specified postprocedural states: Secondary | ICD-10-CM

## 2020-08-27 DIAGNOSIS — M2042 Other hammer toe(s) (acquired), left foot: Secondary | ICD-10-CM

## 2020-08-30 ENCOUNTER — Encounter: Payer: Self-pay | Admitting: Podiatry

## 2020-08-30 NOTE — Progress Notes (Signed)
  Subjective:  Patient ID: Sophia Suarez, female    DOB: [birth year],  MRN: [id number]  Chief Complaint  Patient presents with  . Follow-up    Pt doing well after procedure- back to regular walking-     DOS: 127/22 Procedure: Right second toe and bilateral third toe flexor tenotomy's  80 y.o. female returns for post-op check.  Doing well the toes feel much better its improving her walking  Review of Systems: Negative except as noted in the HPI. Denies N/V/F/Ch.   Objective:  There were no vitals filed for this visit. There is no height or weight on file to calculate BMI. Constitutional Well developed. Well nourished.  Vascular Foot warm and well perfused. Capillary refill normal to all digits.   Neurologic Normal speech. Oriented to person, place, and time. Epicritic sensation to light touch grossly present bilaterally.  Dermatologic Skin healing well without signs of infection  Orthopedic:  No tenderness to palpation noted about the surgical site.  Good reduction of hammertoe deformity    Assessment:   1. Hammertoe of left foot   2. Hammertoe of right foot   3. Post-operative state    Plan:  Patient was evaluated and treated and all questions answered.  -May resume full activity and regular shoe gear -Return to me if other toes bother her in the future  Return if symptoms worsen or fail to improve.

## 2020-09-23 DIAGNOSIS — H401131 Primary open-angle glaucoma, bilateral, mild stage: Secondary | ICD-10-CM | POA: Diagnosis not present

## 2020-10-13 ENCOUNTER — Other Ambulatory Visit: Payer: Self-pay

## 2020-10-13 ENCOUNTER — Ambulatory Visit: Payer: Medicare Other | Admitting: Podiatry

## 2020-10-13 ENCOUNTER — Encounter: Payer: Self-pay | Admitting: Podiatry

## 2020-10-13 DIAGNOSIS — N179 Acute kidney failure, unspecified: Secondary | ICD-10-CM | POA: Insufficient documentation

## 2020-10-13 DIAGNOSIS — F324 Major depressive disorder, single episode, in partial remission: Secondary | ICD-10-CM | POA: Insufficient documentation

## 2020-10-13 DIAGNOSIS — G8929 Other chronic pain: Secondary | ICD-10-CM

## 2020-10-13 DIAGNOSIS — F330 Major depressive disorder, recurrent, mild: Secondary | ICD-10-CM | POA: Insufficient documentation

## 2020-10-13 DIAGNOSIS — M79675 Pain in left toe(s): Secondary | ICD-10-CM

## 2020-10-13 DIAGNOSIS — E559 Vitamin D deficiency, unspecified: Secondary | ICD-10-CM | POA: Insufficient documentation

## 2020-10-13 DIAGNOSIS — M76822 Posterior tibial tendinitis, left leg: Secondary | ICD-10-CM

## 2020-10-13 DIAGNOSIS — I779 Disorder of arteries and arterioles, unspecified: Secondary | ICD-10-CM | POA: Insufficient documentation

## 2020-10-13 DIAGNOSIS — N393 Stress incontinence (female) (male): Secondary | ICD-10-CM | POA: Insufficient documentation

## 2020-10-13 DIAGNOSIS — E1121 Type 2 diabetes mellitus with diabetic nephropathy: Secondary | ICD-10-CM | POA: Insufficient documentation

## 2020-10-13 DIAGNOSIS — E119 Type 2 diabetes mellitus without complications: Secondary | ICD-10-CM

## 2020-10-13 DIAGNOSIS — G479 Sleep disorder, unspecified: Secondary | ICD-10-CM | POA: Insufficient documentation

## 2020-10-13 DIAGNOSIS — N952 Postmenopausal atrophic vaginitis: Secondary | ICD-10-CM | POA: Insufficient documentation

## 2020-10-13 DIAGNOSIS — M79674 Pain in right toe(s): Secondary | ICD-10-CM

## 2020-10-13 DIAGNOSIS — Z90710 Acquired absence of both cervix and uterus: Secondary | ICD-10-CM | POA: Insufficient documentation

## 2020-10-13 DIAGNOSIS — R112 Nausea with vomiting, unspecified: Secondary | ICD-10-CM | POA: Insufficient documentation

## 2020-10-13 DIAGNOSIS — K5900 Constipation, unspecified: Secondary | ICD-10-CM | POA: Insufficient documentation

## 2020-10-13 DIAGNOSIS — N189 Chronic kidney disease, unspecified: Secondary | ICD-10-CM | POA: Insufficient documentation

## 2020-10-13 DIAGNOSIS — R143 Flatulence: Secondary | ICD-10-CM | POA: Insufficient documentation

## 2020-10-13 DIAGNOSIS — N183 Chronic kidney disease, stage 3 unspecified: Secondary | ICD-10-CM | POA: Insufficient documentation

## 2020-10-13 DIAGNOSIS — J4520 Mild intermittent asthma, uncomplicated: Secondary | ICD-10-CM | POA: Insufficient documentation

## 2020-10-13 DIAGNOSIS — E041 Nontoxic single thyroid nodule: Secondary | ICD-10-CM | POA: Insufficient documentation

## 2020-10-13 DIAGNOSIS — R1310 Dysphagia, unspecified: Secondary | ICD-10-CM | POA: Insufficient documentation

## 2020-10-13 DIAGNOSIS — B351 Tinea unguium: Secondary | ICD-10-CM

## 2020-10-13 DIAGNOSIS — Z6832 Body mass index (BMI) 32.0-32.9, adult: Secondary | ICD-10-CM | POA: Insufficient documentation

## 2020-10-13 DIAGNOSIS — I359 Nonrheumatic aortic valve disorder, unspecified: Secondary | ICD-10-CM | POA: Insufficient documentation

## 2020-10-13 DIAGNOSIS — M25572 Pain in left ankle and joints of left foot: Secondary | ICD-10-CM

## 2020-10-13 DIAGNOSIS — G250 Essential tremor: Secondary | ICD-10-CM | POA: Insufficient documentation

## 2020-10-13 DIAGNOSIS — N3281 Overactive bladder: Secondary | ICD-10-CM | POA: Insufficient documentation

## 2020-10-13 DIAGNOSIS — H401131 Primary open-angle glaucoma, bilateral, mild stage: Secondary | ICD-10-CM | POA: Insufficient documentation

## 2020-10-13 DIAGNOSIS — L900 Lichen sclerosus et atrophicus: Secondary | ICD-10-CM | POA: Insufficient documentation

## 2020-10-18 NOTE — Progress Notes (Signed)
Subjective: Sophia Suarez presents today for follow up of preventative diabetic foot care, painful mycotic nails b/l that are difficult to trim. Pain interferes with ambulation. Aggravating factors include wearing enclosed shoe gear. Pain is relieved with periodic professional debridement   She did see [name] for chronic painful corn  distal tip right 4th digit. She had flexor tenotomy performed. She does have some discomfort of the right 4th digit when weightbearing and notes accentuated creasing at dorsal aspect of the joint.  She also relates left ankle instability. Last week at grocery store, she felt her ankle gave way and she had difficulty walking.   PCP is Dr. Johnn Hai and last visit was 03/10/2020.   Allergies  Allergen Reactions  . Clindamycin/Lincomycin Anaphylaxis  . Other Anaphylaxis and Itching  . Septra [Sulfamethoxazole-Trimethoprim] Anaphylaxis  . Sulfa Antibiotics Anaphylaxis  . Melatonin Other (See Comments) and Hypertension    Over active   . Bee Pollen Other (See Comments)    Congestion.  . Cheese     When in combination with chocolate  . Clindamycin Hcl     Other reaction(s): anaphylaxis  . Codeine Other (See Comments)    Unknown  . Horse-Derived Products Itching  . Latex Hives  . Mold Extract [Trichophyton] Other (See Comments)    Congestion.  . Pollen Extract Other (See Comments)    Congestion.     Objective: There were no vitals filed for this visit.  Vascular Examination:  Capillary refill time to digits immediate b/l. Palpable pedal pulses b/l LE. Pedal hair present. Lower extremity skin temperature gradient within normal limits. +1 pitting edema b/l lower extremities.  Dermatological Examination: Pedal skin with normal turgor, texture and tone bilaterally. No open wounds bilaterally. Toenails 1-5 right, L 2nd toe, L 3rd toe, L 4th toe and L 5th toe elongated, discolored, dystrophic, thickened, and crumbly with subungual debris and  tenderness to dorsal palpation. Resolving hyperkeratosis distal tips of b/l 4th and right 2nd digits.   Musculoskeletal: Normal muscle strength 5/5 to all lower extremity muscle groups bilaterally. No pain crepitus or joint limitation noted with ROM b/l. Hammertoes noted to the b/l lower extremities and 2-5 bilaterally. Pes planus deformity noted b/l.   Neurological: Protective sensation intact 5/5 intact bilaterally with 10g monofilament b/l and vibratory sensation intact b/l  Assessment: 1. Pain due to onychomycosis of toenails of both feet   2. Pain in toe of right foot   3. Posterior tibial tendinitis of left lower extremity   4. Chronic pain of left ankle   5. Controlled type 2 diabetes mellitus without complication, without long-term current use of insulin (HCC)    Plan: -Continue diabetic foot care principles. -Patient has ankle brace and feels she cannot wear it. She is unable to apply the brace. She would like to see [name] for her ankle as well. I applied an ace bandage to her right ankle. She may wear this daily until she sees [name]. If condition worsens, call office. -Toenails 1-5 right, L 2nd toe, L 3rd toe, L 4th toe and L 5th toe debrided in length and girth without iatrogenic bleeding with sterile nail nipper and dremel.  -Follow up with [name] for right 4th digit discomfort. -Patient to report any pedal injuries to medical professional immediately. -Patient to continue soft, supportive shoe gear daily. -Patient/POA to call should there be question/concern in the interim.  Return in about 3 months (around 01/12/2021).

## 2020-10-20 ENCOUNTER — Other Ambulatory Visit: Payer: Self-pay

## 2020-10-20 ENCOUNTER — Ambulatory Visit (INDEPENDENT_AMBULATORY_CARE_PROVIDER_SITE_OTHER): Payer: Medicare Other | Admitting: Podiatry

## 2020-10-20 DIAGNOSIS — M79674 Pain in right toe(s): Secondary | ICD-10-CM

## 2020-10-20 DIAGNOSIS — G8929 Other chronic pain: Secondary | ICD-10-CM | POA: Diagnosis not present

## 2020-10-20 DIAGNOSIS — M25572 Pain in left ankle and joints of left foot: Secondary | ICD-10-CM

## 2020-10-20 DIAGNOSIS — M25372 Other instability, left ankle: Secondary | ICD-10-CM

## 2020-10-25 NOTE — Progress Notes (Signed)
  Subjective:  Patient ID: Sophia Suarez, female    DOB: [birth year],  MRN: [id number]  Chief Complaint  Patient presents with  . Hammer Toe      c/o right 4th toe pain s/p tenotomy. Also left ankle pain/instability- per dr Eloy End    80 y.o. female returns for follow-up with the above complaint. History confirmed with patient.  Overall doing much better.  Still has some pain in the toe but is much improved.  She feels like her ankles giving out and is rolling, she brought the Tri-Lock ankle brace that she has been used before but it did not help and made her swelling worse.  Objective:  Physical Exam: warm, good capillary refill, no trophic changes or ulcerative lesions, normal DP and PT pulses and normal sensory exam.  Well-healed flexor tenotomy site right fourth toe.  Minimal pain.  Mild pain anterolateral ankle, no gross instability, negative Lachman's negative talar tilt   Assessment:   1. Ankle instability, left   2. Chronic pain of left ankle   3. Pain in toe of right foot      Plan:  Patient was evaluated and treated and all questions answered.  Majority of her instability is largely functional.  She should benefit from physical therapy.  [facility]  Reviewed RICE protocol for ankle injuries.   Return in about 6 weeks (around 12/01/2020).

## 2020-12-10 ENCOUNTER — Ambulatory Visit: Payer: Medicare Other | Admitting: Podiatry

## 2020-12-10 ENCOUNTER — Other Ambulatory Visit: Payer: Self-pay

## 2020-12-10 DIAGNOSIS — M25372 Other instability, left ankle: Secondary | ICD-10-CM

## 2020-12-10 DIAGNOSIS — M76822 Posterior tibial tendinitis, left leg: Secondary | ICD-10-CM | POA: Diagnosis not present

## 2020-12-10 NOTE — Patient Instructions (Addendum)
Look for Vionic shoes, they can be bought from [employer] ([email or web address])

## 2020-12-14 ENCOUNTER — Encounter: Payer: Self-pay | Admitting: Podiatry

## 2020-12-14 NOTE — Progress Notes (Signed)
  Subjective:  Patient ID: ASPIN PALOMAREZ, female    DOB: [birth year],  MRN: [id number]  Chief Complaint  Patient presents with  . Ankle Pain     6 week fu tendon tear    80 y.o. female returns for follow-up with the above complaint. History confirmed with patient.  She still continues to improve.  Feels like she is more stable in sneakers.  He is having some pain in the ball of her foot.  Objective:  Physical Exam: warm, good capillary refill, no trophic changes or ulcerative lesions, normal DP and PT pulses and normal sensory exam.  Well-healed flexor tenotomy site right fourth toe.  Minimal pain.  No ankle pain today.  No gross instability negative Lachman's.   Assessment:   1. Ankle instability, left   2. Posterior tibial tendinitis of left lower extremity      Plan:  Patient was evaluated and treated and all questions answered.  Overall she is improved significantly with physical therapy.  She will complete her therapy and return as needed if it returns or worsens   Return if symptoms worsen or fail to improve.

## 2020-12-26 ENCOUNTER — Other Ambulatory Visit: Payer: Self-pay | Admitting: Cardiovascular Disease

## 2020-12-29 ENCOUNTER — Other Ambulatory Visit (HOSPITAL_COMMUNITY): Payer: Self-pay | Admitting: Cardiovascular Disease

## 2020-12-29 DIAGNOSIS — I6521 Occlusion and stenosis of right carotid artery: Secondary | ICD-10-CM

## 2021-01-26 ENCOUNTER — Other Ambulatory Visit: Payer: Self-pay

## 2021-01-26 ENCOUNTER — Other Ambulatory Visit (HOSPITAL_COMMUNITY): Payer: Self-pay | Admitting: Cardiovascular Disease

## 2021-01-26 ENCOUNTER — Ambulatory Visit (HOSPITAL_COMMUNITY)
Admission: RE | Admit: 2021-01-26 | Discharge: 2021-01-26 | Disposition: A | Discharge status: Home / Self Care | Payer: Medicare Other | Source: Ambulatory Visit | Attending: Internal Medicine | Admitting: Internal Medicine

## 2021-01-26 DIAGNOSIS — I771 Stricture of artery: Secondary | ICD-10-CM

## 2021-01-26 DIAGNOSIS — I6521 Occlusion and stenosis of right carotid artery: Secondary | ICD-10-CM | POA: Insufficient documentation

## 2021-01-27 ENCOUNTER — Ambulatory Visit: Payer: Medicare Other | Admitting: Podiatry

## 2021-01-27 DIAGNOSIS — H401131 Primary open-angle glaucoma, bilateral, mild stage: Secondary | ICD-10-CM | POA: Diagnosis not present

## 2021-01-28 NOTE — Progress Notes (Signed)
Patient ID: Sophia Suarez, female   DOB: 1940/07/25, 80 y.o.   MRN: [id number]     80 y.o. first seen by me 5/14 . Has had TIAls with borderline carotid lesion. Sees Dr Marjory Lies as well. Reviewed CTA from 4/26 and RICA has a ? Of 55-60% stenosis not typically bad enough to operate on.   No history of CAD. Active with no chest pain. Long standing murmur.   Echo7/15/21 normal EF mild AS mean gradient 12 mmhg  Carotid 01/26/21 0-59% RICA Has been on chronic plavix per neurology Thyroid nodule noted   Had left TKR [facility] 2019 March 04/23/18 Right Parotid mass removal Rosen  Right nail avulsion hallux 11/27/19   Left > right LE edema Has had another mass/infection on right side of neck been on doxycycline and seeing DR Hall/Rosen  He dog Dia Sitter passed away has thought about getting another but wants her neck issues To be resolved   No cardiac issues  Not getting along with daughter She wants her inheritance now Son very supportive  ROS: Denies fever, malais, weight loss, blurry vision, decreased visual acuity, cough, sputum, SOB, hemoptysis, pleuritic pain, palpitaitons, heartburn, abdominal pain, melena, lower extremity edema, claudication, or rash.  All other systems reviewed and negative  General: BP (!) 144/96   Pulse 67   Ht 4' 10.5" (1.486 m)   Wt 72.6 kg   SpO2 98%   BMI 32.87 kg/m  Affect appropriate Healthy:  appears stated age HEENT: post right parotid excision with right neck bandage  Neck supple with no adenopathy JVP normal no bruits no thyromegaly Lungs clear with no wheezing and good diaphragmatic motion Heart:  S1/S2 mild AS  murmur, no rub, gallop or click PMI normal Abdomen: benighn, BS positve, no tenderness, no AAA no bruit.  No HSM or HJR Distal pulses intact with no bruits No edema Neuro non-focal Skin warm and dry Post left TKR     Current Outpatient Medications  Medication Sig Dispense Refill   acetaminophen (TYLENOL) 325 MG tablet Take by mouth.      alendronate (FOSAMAX) 70 MG tablet Take 1 tablet by mouth once a week.     aspirin 81 MG chewable tablet 1 tablet     Biotin 5 MG TBDP Take by mouth.     Biotin w/ Vitamins C & E (HAIR SKIN & NAILS GUMMIES) 1250-7.5-7.5 MCG-MG-UNT CHEW 2 gummies     Calcium Carb-Cholecalciferol 600-200 MG-UNIT TABS 2  tablets     clobetasol ointment (TEMOVATE) 0.05 %      clopidogrel (PLAVIX) 75 MG tablet TAKE 1 TABLET (75 MG TOTAL) BY MOUTH DAILY. 30 tablet 0   co-enzyme Q-10 30 MG capsule Take by mouth.     Coenzyme Q10-Vitamin E (QUNOL ULTRA COQ10 PO) Take 1 capsule by mouth daily.     diphenhydrAMINE (BENADRYL) 25 mg capsule Take 25 mg by mouth daily.     docusate sodium (COLACE) 100 MG capsule Take 100 mg by mouth at bedtime as needed for mild constipation.     dorzolamide-timolol (COSOPT) 22.3-6.8 MG/ML ophthalmic solution      Glucos-Chond-Hyal Ac-Ca Fructo (MOVE FREE JOINT HEALTH ADVANCE) TABS Take 2 tablets by mouth daily.     hydrochlorothiazide (HYDRODIURIL) 25 MG tablet Take 1 tablet (25 mg total) by mouth daily. Please schedule appointment for future refills. Thank you 60 tablet 0   hydrOXYzine (ATARAX/VISTARIL) 25 MG tablet Take 25 mg by mouth at bedtime as needed.     losartan (COZAAR)  100 MG tablet Take 100 mg by mouth daily.      Multiple Vitamin (MULTIVITAMIN WITH MINERALS) TABS tablet Take 1 tablet by mouth daily. Centrum Silver     mupirocin cream (BACTROBAN) 2 % Apply 1 application topically 3 (three) times daily. PRN     mupirocin ointment (BACTROBAN) 2 % Apply topically 3 (three) times daily as needed.     Omega-3 Fatty Acids (FISH OIL) 1000 MG CAPS Take 2,000 mg by mouth daily.     pantoprazole (PROTONIX) 20 MG tablet TAKE 1 TABLET ONCE A DAY APPROXIMATELY 30 MINUTES BEFORE BREAKFAST     Polyethyl Glycol-Propyl Glycol (SYSTANE) 0.4-0.3 % SOLN Place 1 drop into both eyes as needed.     Probiotic Product (PROBIOTIC DAILY PO) Take 1 capsule by mouth daily.     simvastatin (ZOCOR) 40 MG  tablet Take 40 mg by mouth every evening.     sodium chloride (OCEAN) 0.65 % SOLN nasal spray Place 1 spray into both nostrils 4 (four) times daily as needed for congestion.     St Johns Wort 300 MG TABS Take by mouth.     vitamin B-12 (CYANOCOBALAMIN) 1000 MCG tablet Take 1,000 mcg by mouth daily.     brimonidine (ALPHAGAN) 0.2 % ophthalmic solution Place 1 drop into both eyes 2 (two) times daily. (Patient not taking: Reported on 01/29/2021)     cetirizine (ZYRTEC) 10 MG tablet Take 10 mg by mouth at bedtime. (Patient not taking: Reported on 01/29/2021)     [employer] ACETAMINOPHEN EX ST 500 MG tablet TAKE 2 CAPSULES BY MOUTH EVERY 6 HOURS AS NEEDED. (Patient not taking: Reported on 01/29/2021)     dorzolamide (TRUSOPT) 2 % ophthalmic solution Place 1 drop into both eyes 2 (two) times daily.  (Patient not taking: Reported on 01/29/2021)     doxycycline (DORYX) 100 MG EC tablet Take 100 mg by mouth 2 (two) times daily. For 14 days (Patient not taking: Reported on 01/29/2021)     ibuprofen (ADVIL) 400 MG tablet TAKE 1 TABLET BY MOUTH EVERY 6 HOURS AS NEEDED FOR UP TO 10 DAYS. (Patient not taking: Reported on 01/29/2021)     ipratropium (ATROVENT) 0.03 % nasal spray Place into the nose.     timolol (TIMOPTIC) 0.5 % ophthalmic solution Apply to eye. (Patient not taking: Reported on 01/29/2021)     tiZANidine (ZANAFLEX) 2 MG tablet Take 1 tablet (2 mg total) by mouth every 6 (six) hours as needed for muscle spasms. (Patient not taking: Reported on 01/29/2021) 60 tablet 0   triamcinolone cream (KENALOG) 0.1 % Apply 1 application topically 2 (two) times daily as needed (for skin irriation/ rash/lichenoid dermatitis). (Patient not taking: Reported on 01/29/2021)     Wheat Dextrin (BENEFIBER) POWD See admin instructions. (Patient not taking: Reported on 01/29/2021)     No current facility-administered medications for this visit.    Allergies  Clindamycin/lincomycin, Other, Septra [sulfamethoxazole-trimethoprim], Sulfa  antibiotics, Melatonin, Bee pollen, Cheese, Clindamycin hcl, Codeine, Horse-derived products, Latex, Mold extract [trichophyton], and Pollen extract  Electrocardiogram:  01/29/2021 SR rate 67 normal   Assessment and Plan AS: mild f/u echo for AS  01/23/20 EF 60-65% mean gradient 12 peak 23 DVI 0.41 does not need yearly echo's at this point unless change in murmur or symptoms  Carotid   40-59% RICA stenosis.01/26/21 f/u duplex in a year F/U primary regarding thyroid nodule noted Consider f/u dedicated US of thyroid   HTN: Better with diuretic  continue ARB  Discussed diet  and weight loss   ENT: post right parotid mass removal F/U Pollyann Kennedy new lesion in right cervical area f/u Rosen/Hall Has finished course of doxycycline   Ortho:  Post left TKR f/u [facility]   Edema;  Dependant ? Component lymph edema increase HcTZ 25 mg daily    F/U in a year   [employer]

## 2021-01-29 ENCOUNTER — Other Ambulatory Visit: Payer: Self-pay

## 2021-01-29 ENCOUNTER — Encounter: Payer: Self-pay | Admitting: Cardiovascular Disease

## 2021-01-29 ENCOUNTER — Ambulatory Visit (INDEPENDENT_AMBULATORY_CARE_PROVIDER_SITE_OTHER): Payer: Medicare Other | Admitting: Cardiovascular Disease

## 2021-01-29 VITALS — BP 144/96 | HR 67 | Ht 58.5 in | Wt 160.0 lb

## 2021-01-29 DIAGNOSIS — I10 Essential (primary) hypertension: Secondary | ICD-10-CM

## 2021-01-29 DIAGNOSIS — I350 Nonrheumatic aortic (valve) stenosis: Secondary | ICD-10-CM | POA: Diagnosis not present

## 2021-01-29 DIAGNOSIS — I6521 Occlusion and stenosis of right carotid artery: Secondary | ICD-10-CM | POA: Diagnosis not present

## 2021-01-29 NOTE — Patient Instructions (Addendum)
Medication Instructions:  *If you need a refill on your cardiac medications before your next appointment, please call your pharmacy*  Lab Work: If you have labs (blood work) drawn today and your tests are completely normal, you will receive your results only by: MyChart Message (if you have MyChart) OR A paper copy in the mail If you have any lab test that is abnormal or we need to change your treatment, we will call you to review the results.  Follow-Up: At [facility], you and your health needs are our priority.  As part of our continuing mission to provide you with exceptional heart care, we have created designated Provider Care Teams.  These Care Teams include your primary Cardiologist (physician) and Advanced Practice Providers (APPs -  Physician Assistants and Nurse Practitioners) who all work together to provide you with the care you need, when you need it.  We recommend signing up for the patient portal called "MyChart".  Sign up information is provided on this After Visit Summary.  MyChart is used to connect with patients for Virtual Visits (Telemedicine).  Patients are able to view lab/test results, encounter notes, upcoming appointments, etc.  Non-urgent messages can be sent to your provider as well.   To learn more about what you can do with MyChart, go to [email or web address].    Your next appointment:   12 month(s)  The format for your next appointment:   In Person  Provider:   You may see Dr. Nishan or one of the following Advanced Practice Providers on your designated Care Team:   Laura Ingold, NP  

## 2021-02-02 ENCOUNTER — Other Ambulatory Visit: Payer: Self-pay

## 2021-02-02 ENCOUNTER — Ambulatory Visit: Payer: Medicare Other | Admitting: Podiatry

## 2021-02-02 DIAGNOSIS — M79674 Pain in right toe(s): Secondary | ICD-10-CM | POA: Diagnosis not present

## 2021-02-02 DIAGNOSIS — B351 Tinea unguium: Secondary | ICD-10-CM | POA: Diagnosis not present

## 2021-02-02 DIAGNOSIS — M79675 Pain in left toe(s): Secondary | ICD-10-CM

## 2021-02-02 DIAGNOSIS — E119 Type 2 diabetes mellitus without complications: Secondary | ICD-10-CM | POA: Diagnosis not present

## 2021-02-06 ENCOUNTER — Encounter: Payer: Self-pay | Admitting: Podiatry

## 2021-02-06 NOTE — Progress Notes (Signed)
  Subjective: Sophia Suarez is a pleasant 80 y.o. female patient seen today painful thick toenails that are difficult to trim. Pain interferes with ambulation. Aggravating factors include wearing enclosed shoe gear. Pain is relieved with periodic professional debridement.  PCP is Mila Palmer, MD.  Allergies  Allergen Reactions   Clindamycin/Lincomycin Anaphylaxis   Other Anaphylaxis and Itching   Septra [Sulfamethoxazole-Trimethoprim] Anaphylaxis   Sulfa Antibiotics Anaphylaxis   Melatonin Other (See Comments) and Hypertension    Over active    Bee Pollen Other (See Comments)    Congestion.   Cheese     When in combination with chocolate   Clindamycin Hcl     Other reaction(s): anaphylaxis   Codeine Other (See Comments)    Unknown   Horse-Derived Products Itching   Latex Hives   Mold Extract [Trichophyton] Other (See Comments)    Congestion.   Pollen Extract Other (See Comments)    Congestion.    Objective: Physical Exam  General: Sophia Suarez is a pleasant 80 y.o. African American female, WD, WN in NAD. AAO x 3.   Vascular:  Capillary refill time to digits immediate b/l. Palpable DP pulse(s) b/l lower extremities Palpable PT pulse(s) b/l lower extremities Pedal hair present. Lower extremity skin temperature gradient within normal limits. +1 pitting edema b/l lower extremities.  Dermatological:  No open wounds b/l lower extremities. No interdigital macerations b/l lower extremities. Toenails 1-5 bilaterally elongated, discolored, dystrophic, thickened, and crumbly with subungual debris and tenderness to dorsal palpation.  Musculoskeletal:  Normal muscle strength 5/5 to all lower extremity muscle groups bilaterally. Hammertoe(s) noted to the b/l lower extremities. Pes planus deformity noted b/l lower extremities.  Neurological:  Protective sensation intact 5/5 intact bilaterally with 10g monofilament b/l. Vibratory sensation intact b/l.  Assessment and Plan:   1. Pain due to onychomycosis of toenails of both feet   2. Controlled type 2 diabetes mellitus without complication, without long-term current use of insulin (HCC)      -Examined patient. -Patient to continue soft, supportive shoe gear daily. -Toenails 1-5 b/l were debrided in length and girth with sterile nail nippers and dremel without iatrogenic bleeding.  -Patient to report any pedal injuries to medical professional immediately. -Patient/POA to call should there be question/concern in the interim.  Return in about 3 months (around 05/05/2021).  [name], DPM

## 2021-02-10 ENCOUNTER — Other Ambulatory Visit: Payer: Self-pay | Admitting: Family Medicine

## 2021-02-10 DIAGNOSIS — E041 Nontoxic single thyroid nodule: Secondary | ICD-10-CM

## 2021-02-10 DIAGNOSIS — Z1231 Encounter for screening mammogram for malignant neoplasm of breast: Secondary | ICD-10-CM

## 2021-02-25 ENCOUNTER — Ambulatory Visit
Admission: RE | Admit: 2021-02-25 | Discharge: 2021-02-25 | Disposition: A | Discharge status: Home / Self Care | Payer: Medicare Other | Source: Ambulatory Visit | Attending: Family Medicine | Admitting: Family Medicine

## 2021-02-25 DIAGNOSIS — E041 Nontoxic single thyroid nodule: Secondary | ICD-10-CM

## 2021-03-02 ENCOUNTER — Other Ambulatory Visit: Payer: Self-pay | Admitting: Cardiovascular Disease

## 2021-03-09 DIAGNOSIS — I639 Cerebral infarction, unspecified: Secondary | ICD-10-CM | POA: Diagnosis not present

## 2021-03-09 DIAGNOSIS — E785 Hyperlipidemia, unspecified: Secondary | ICD-10-CM | POA: Diagnosis not present

## 2021-03-09 DIAGNOSIS — E041 Nontoxic single thyroid nodule: Secondary | ICD-10-CM | POA: Diagnosis not present

## 2021-03-09 DIAGNOSIS — Z0000 Encounter for general adult medical examination without abnormal findings: Secondary | ICD-10-CM | POA: Diagnosis not present

## 2021-03-09 DIAGNOSIS — I10 Essential (primary) hypertension: Secondary | ICD-10-CM | POA: Diagnosis not present

## 2021-03-09 DIAGNOSIS — N183 Chronic kidney disease, stage 3 unspecified: Secondary | ICD-10-CM | POA: Diagnosis not present

## 2021-03-09 DIAGNOSIS — E1122 Type 2 diabetes mellitus with diabetic chronic kidney disease: Secondary | ICD-10-CM | POA: Diagnosis not present

## 2021-03-09 DIAGNOSIS — I351 Nonrheumatic aortic (valve) insufficiency: Secondary | ICD-10-CM | POA: Diagnosis not present

## 2021-03-09 DIAGNOSIS — Z79899 Other long term (current) drug therapy: Secondary | ICD-10-CM | POA: Diagnosis not present

## 2021-03-09 DIAGNOSIS — E559 Vitamin D deficiency, unspecified: Secondary | ICD-10-CM | POA: Diagnosis not present

## 2021-03-18 ENCOUNTER — Other Ambulatory Visit: Payer: Self-pay | Admitting: Family Medicine

## 2021-03-18 DIAGNOSIS — E2839 Other primary ovarian failure: Secondary | ICD-10-CM

## 2021-03-31 DIAGNOSIS — Z23 Encounter for immunization: Secondary | ICD-10-CM | POA: Diagnosis not present

## 2021-04-02 ENCOUNTER — Ambulatory Visit
Admission: RE | Admit: 2021-04-02 | Discharge: 2021-04-02 | Disposition: A | Discharge status: Home / Self Care | Payer: Medicare Other | Source: Ambulatory Visit | Attending: Family Medicine | Admitting: Family Medicine

## 2021-04-02 ENCOUNTER — Other Ambulatory Visit: Payer: Self-pay

## 2021-04-02 DIAGNOSIS — Z1231 Encounter for screening mammogram for malignant neoplasm of breast: Secondary | ICD-10-CM

## 2021-04-13 ENCOUNTER — Ambulatory Visit
Admission: RE | Admit: 2021-04-13 | Discharge: 2021-04-13 | Disposition: A | Discharge status: Home / Self Care | Payer: Medicare Other | Source: Ambulatory Visit | Attending: Family Medicine | Admitting: Family Medicine

## 2021-04-13 ENCOUNTER — Other Ambulatory Visit: Payer: Self-pay

## 2021-04-13 DIAGNOSIS — M85832 Other specified disorders of bone density and structure, left forearm: Secondary | ICD-10-CM | POA: Diagnosis not present

## 2021-04-13 DIAGNOSIS — E2839 Other primary ovarian failure: Secondary | ICD-10-CM

## 2021-04-13 DIAGNOSIS — Z780 Asymptomatic menopausal state: Secondary | ICD-10-CM | POA: Diagnosis not present

## 2021-04-13 DIAGNOSIS — M85852 Other specified disorders of bone density and structure, left thigh: Secondary | ICD-10-CM | POA: Diagnosis not present

## 2021-05-04 ENCOUNTER — Ambulatory Visit: Payer: Medicare Other | Admitting: Podiatry

## 2021-05-04 ENCOUNTER — Encounter: Payer: Self-pay | Admitting: Podiatry

## 2021-05-04 ENCOUNTER — Other Ambulatory Visit: Payer: Self-pay

## 2021-05-04 DIAGNOSIS — M79674 Pain in right toe(s): Secondary | ICD-10-CM | POA: Diagnosis not present

## 2021-05-04 DIAGNOSIS — B351 Tinea unguium: Secondary | ICD-10-CM | POA: Diagnosis not present

## 2021-05-04 DIAGNOSIS — L03031 Cellulitis of right toe: Secondary | ICD-10-CM | POA: Diagnosis not present

## 2021-05-04 DIAGNOSIS — E119 Type 2 diabetes mellitus without complications: Secondary | ICD-10-CM | POA: Diagnosis not present

## 2021-05-04 DIAGNOSIS — L600 Ingrowing nail: Secondary | ICD-10-CM | POA: Diagnosis not present

## 2021-05-04 DIAGNOSIS — M79675 Pain in left toe(s): Secondary | ICD-10-CM

## 2021-05-04 DIAGNOSIS — L02611 Cutaneous abscess of right foot: Secondary | ICD-10-CM

## 2021-05-04 MED ORDER — DOXYCYCLINE HYCLATE 100 MG PO CAPS
100.0000 mg | ORAL_CAPSULE | Freq: Two times a day (BID) | ORAL | 0 refills | Status: AC
Start: 2021-05-04 — End: 2021-05-11

## 2021-05-10 NOTE — Progress Notes (Signed)
  Subjective:  Patient ID: Sophia Suarez, female    DOB: Nov 15, 1940,  MRN: [id number]  Sophia Suarez presents to clinic today for preventative diabetic foot care and painful thick toenails that are difficult to trim. Pain interferes with ambulation. Aggravating factors include wearing enclosed shoe gear. Pain is relieved with periodic professional debridement.  Patient is diabetic and does not monitor blood glucose daily.  PCP is Mila Palmer, MD , and last visit was August, 2022.  Allergies  Allergen Reactions   Clindamycin/Lincomycin Anaphylaxis   Other Anaphylaxis and Itching   Septra [Sulfamethoxazole-Trimethoprim] Anaphylaxis   Sulfa Antibiotics Anaphylaxis   Melatonin Other (See Comments) and Hypertension    Over active    Bee Pollen Other (See Comments)    Congestion.   Cheese     When in combination with chocolate   Clindamycin Hcl     Other reaction(s): anaphylaxis   Codeine Other (See Comments)    Unknown   Horse-Derived Products Itching   Latex Hives   Mold Extract [Trichophyton] Other (See Comments)    Congestion.   Pollen Extract Other (See Comments)    Congestion.    Review of Systems: Negative except as noted in the HPI. Objective:   Constitutional Sophia Suarez is a pleasant 79 y.o. Caucasian female, in NAD. AAO x 3.   Vascular Capillary refill time to digits immediate b/l. Palpable pedal pulses b/l LE. Pedal hair present. Lower extremity skin temperature gradient within normal limits. +1 pitting edema noted BLE. No cyanosis or clubbing noted.  Neurologic Normal speech. Oriented to person, place, and time. Protective sensation intact 5/5 intact bilaterally with 10g monofilament b/l. Vibratory sensation intact b/l.  Dermatologic Pedal skin is warm and supple b/l LE. No open wounds b/l lower extremities. No interdigital macerations b/l lower extremities. Toenails 1-5 b/l elongated, discolored, dystrophic, thickened, crumbly with subungual debris and  tenderness to dorsal palpation. Incurvated nailplate lateral border(s) R hallux.  Nail border hypertrophy present. There is tenderness to palpation. Sign(s) of infection: pinpoint purulent drainage which does not penetrate into deep tissues.  Orthopedic: Normal muscle strength 5/5 to all lower extremity muscle groups bilaterally. Pes planus deformity noted b/l lower extremities.   Radiographs: None Assessment:   1. Pain due to onychomycosis of toenails of both feet   2. Ingrown right big toenail   3. Cellulitis and abscess of toe of right foot   4. Controlled type 2 diabetes mellitus without complication, without long-term current use of insulin (HCC)    Plan:  Patient was evaluated and treated and all questions answered. Consent given for treatment as described below: -Examined patient. -Continue diabetic foot care principles: inspect feet daily, monitor glucose as recommended by PCP and/or Endocrinologist, and follow prescribed diet per PCP, Endocrinologist and/or dietician. -Toenails 1-5 b/l were debrided in length and girth with sterile nail nippers and dremel without iatrogenic bleeding.  -Offending nail border debrided and curretaged R hallux utilizing sterile nail nipper and currette. Border(s) cleansed with hydrogen peroxide and alcohol and triple antibiotic ointment applied. Dispensed written instructions for once daily epsom salt soaks for 3-4 days. -Rx for Doxycyline 100 mg, #14, to be taken one capsule twice daily for 7 days. -Patient/POA to call should there be question/concern in the interim.  Return in about 3 months (around 08/04/2021).  [name], DPM

## 2021-05-12 DIAGNOSIS — H353131 Nonexudative age-related macular degeneration, bilateral, early dry stage: Secondary | ICD-10-CM | POA: Diagnosis not present

## 2021-05-12 DIAGNOSIS — H401131 Primary open-angle glaucoma, bilateral, mild stage: Secondary | ICD-10-CM | POA: Diagnosis not present

## 2021-05-12 DIAGNOSIS — H353121 Nonexudative age-related macular degeneration, left eye, early dry stage: Secondary | ICD-10-CM | POA: Diagnosis not present

## 2021-08-13 ENCOUNTER — Other Ambulatory Visit: Payer: Self-pay

## 2021-08-13 ENCOUNTER — Ambulatory Visit: Payer: Medicare Other | Admitting: Podiatry

## 2021-08-13 DIAGNOSIS — L02611 Cutaneous abscess of right foot: Secondary | ICD-10-CM

## 2021-08-13 DIAGNOSIS — E041 Nontoxic single thyroid nodule: Secondary | ICD-10-CM | POA: Insufficient documentation

## 2021-08-13 DIAGNOSIS — F339 Major depressive disorder, recurrent, unspecified: Secondary | ICD-10-CM | POA: Insufficient documentation

## 2021-08-13 DIAGNOSIS — L03031 Cellulitis of right toe: Secondary | ICD-10-CM | POA: Diagnosis not present

## 2021-08-13 DIAGNOSIS — M810 Age-related osteoporosis without current pathological fracture: Secondary | ICD-10-CM | POA: Insufficient documentation

## 2021-08-13 DIAGNOSIS — L600 Ingrowing nail: Secondary | ICD-10-CM | POA: Diagnosis not present

## 2021-08-13 DIAGNOSIS — E2839 Other primary ovarian failure: Secondary | ICD-10-CM | POA: Insufficient documentation

## 2021-08-13 DIAGNOSIS — D692 Other nonthrombocytopenic purpura: Secondary | ICD-10-CM | POA: Insufficient documentation

## 2021-08-13 MED ORDER — CEPHALEXIN 500 MG PO CAPS: 500.0000 mg | ORAL_CAPSULE | Freq: Four times a day (QID) | ORAL | 0 refills | Status: AC

## 2021-08-13 NOTE — Patient Instructions (Signed)
EPSOM SALT FOOT SOAK INSTRUCTIONS  *IF YOU HAVE BEEN PRESCRIBED ANTIBIOTICS, TAKE AS INSTRUCTED UNTIL ALL ARE GONE*  Shopping List:  A. Plain epsom salt (not scented) B. Neosporin Cream/Ointment or Bacitracin Cream/Ointment (or prescribed antiobiotic drops/cream/ointment) C. 1-inch fabric band-aids   Place 1/4 cup of epsom salts in 2 quarts of warm tap water. IF YOU ARE DIABETIC, OR HAVE NEUROPATHY, CHECK THE TEMPERATURE OF THE WATER WITH YOUR ELBOW.   Submerge your foot/feet in the solution and soak for 10-15 minutes.      3.  Next, remove your foot/feet from solution, blot dry the affected area.    4.  Apply light amount of antibiotic cream/ointment and cover with fabric band-aid .  5.  This soak should be done once a day for 10 days.   6.  Monitor for any signs/symptoms of infection such as redness, swelling, odor, drainage, increased pain, or non-healing of digit.   7.  Please do not hesitate to call the office and speak to a Nurse or Doctor if you have questions.   8.  If you experience fever, chills, nightsweats, nausea or vomiting with worsening of digit/foot, please go to the emergency room.  

## 2021-08-13 NOTE — Progress Notes (Signed)
Subjective:  Patient ID: Sophia Suarez, female    DOB: 03-13-1941,  MRN: [id number]  Chief Complaint  Patient presents with   Nail Problem    81 y.o. female presents with the above complaint.  Patient presents with right lateral border ingrown.  Patient states painful to touch.  She states been going for quite some time is progressive gotten worse.  She would like to have removed.  She has not seen anyone as prior to see me.  She is diabetic but diet controlled.  She would like to have removed.  Mild pain on palpation.  Pain scale 7 out of 10 hurts with ambulation dull aching nature   Review of Systems: Negative except as noted in the HPI. Denies N/V/F/Ch.  Past Medical History:  Diagnosis Date   Allergic rhinitis, seasonal    Anemia    Arthritis    Asthma    Carotid artery occlusion    Chronic kidney disease    Chronic pain    Complication of anesthesia    makes her hyper   Diabetes mellitus    no medication  diet controlled   Diverticulitis    Dyspnea    with exertion   GERD (gastroesophageal reflux disease)    Heart murmur    Hyperlipidemia    Hypertension    Lumbago    Panic attacks    Stroke [facility])    Urinary incontinence     Current Outpatient Medications:    cephALEXin (KEFLEX) 500 MG capsule, Take 1 capsule (500 mg total) by mouth 4 (four) times daily for 7 days., Disp: 28 capsule, Rfl: 0   acetaminophen (ACETAMINOPHEN EXTRA STRENGTH) 500 MG tablet, 1 tablet as needed, Disp: , Rfl:    acetaminophen (TYLENOL) 325 MG tablet, Take by mouth., Disp: , Rfl:    alendronate (FOSAMAX) 70 MG tablet, Take 1 tablet by mouth once a week., Disp: , Rfl:    aspirin 81 MG chewable tablet, 1 tablet, Disp: , Rfl:    Biotin 5 MG TBDP, Take by mouth., Disp: , Rfl:    Biotin w/ Vitamins C & E (HAIR SKIN & NAILS GUMMIES) 1250-7.5-7.5 MCG-MG-UNT CHEW, 2 gummies, Disp: , Rfl:    brimonidine (ALPHAGAN) 0.2 % ophthalmic solution, Place 1 drop into both eyes 2 (two) times daily.  (Patient not taking: Reported on 01/29/2021), Disp: , Rfl:    Calcium Carb-Cholecalciferol 600-200 MG-UNIT TABS, 2  tablets, Disp: , Rfl:    cetirizine (ZYRTEC) 10 MG tablet, Take 10 mg by mouth at bedtime. (Patient not taking: Reported on 01/29/2021), Disp: , Rfl:    clobetasol ointment (TEMOVATE) 0.05 %, , Disp: , Rfl:    clopidogrel (PLAVIX) 75 MG tablet, TAKE 1 TABLET (75 MG TOTAL) BY MOUTH DAILY., Disp: 30 tablet, Rfl: 0   clopidogrel (PLAVIX) 75 MG tablet, Take 1 tablet by mouth daily., Disp: , Rfl:    co-enzyme Q-10 30 MG capsule, Take by mouth., Disp: , Rfl:    Coenzyme Q10 (CO Q 10) 60 MG CAPS, 1 capsule with a meal, Disp: , Rfl:    Coenzyme Q10-Vitamin E (QUNOL ULTRA COQ10 PO), Take 1 capsule by mouth daily., Disp: , Rfl:    [employer] ACETAMINOPHEN EX ST 500 MG tablet, TAKE 2 CAPSULES BY MOUTH EVERY 6 HOURS AS NEEDED. (Patient not taking: Reported on 01/29/2021), Disp: , Rfl:    diphenhydrAMINE (BENADRYL) 25 mg capsule, Take 25 mg by mouth daily., Disp: , Rfl:    diphenhydrAMINE (BENADRYL) 25 mg capsule, 1  capsule as needed, Disp: , Rfl:    docusate sodium (COLACE) 100 MG capsule, Take 100 mg by mouth at bedtime as needed for mild constipation., Disp: , Rfl:    dorzolamide (TRUSOPT) 2 % ophthalmic solution, Place 1 drop into both eyes 2 (two) times daily.  (Patient not taking: Reported on 01/29/2021), Disp: , Rfl:    dorzolamide-timolol (COSOPT) 22.3-6.8 MG/ML ophthalmic solution, , Disp: , Rfl:    doxycycline (DORYX) 100 MG EC tablet, Take 100 mg by mouth 2 (two) times daily. For 14 days (Patient not taking: Reported on 01/29/2021), Disp: , Rfl:    famotidine (PEPCID) 40 MG tablet, Take 40 mg by mouth at bedtime., Disp: , Rfl:    Glucos-Chond-Hyal Ac-Ca Fructo (MOVE FREE JOINT HEALTH ADVANCE) TABS, Take 2 tablets by mouth daily., Disp: , Rfl:    hydrochlorothiazide (HYDRODIURIL) 25 MG tablet, Take 1 tablet (25 mg total) by mouth daily., Disp: 90 tablet, Rfl: 2   hydrOXYzine (ATARAX/VISTARIL) 25 MG  tablet, Take 25 mg by mouth at bedtime as needed., Disp: , Rfl:    ibuprofen (ADVIL) 400 MG tablet, TAKE 1 TABLET BY MOUTH EVERY 6 HOURS AS NEEDED FOR UP TO 10 DAYS. (Patient not taking: Reported on 01/29/2021), Disp: , Rfl:    ipratropium (ATROVENT) 0.03 % nasal spray, Place into the nose., Disp: , Rfl:    ipratropium (ATROVENT) 0.03 % nasal spray, INSTILL 2 SPRAYS IN BOTH  NOSTRILS EVERY 12 HOURS, Disp: , Rfl:    ipratropium (ATROVENT) 0.03 % nasal spray, 2 sprays in each nostril, Disp: , Rfl:    losartan (COZAAR) 100 MG tablet, Take 100 mg by mouth daily. , Disp: , Rfl:    losartan (COZAAR) 100 MG tablet, Take 1 tablet by mouth daily., Disp: , Rfl:    Multiple Vitamin (MULTIVITAMIN WITH MINERALS) TABS tablet, Take 1 tablet by mouth daily. Centrum Silver, Disp: , Rfl:    mupirocin cream (BACTROBAN) 2 %, Apply 1 application topically 3 (three) times daily. PRN, Disp: , Rfl:    mupirocin ointment (BACTROBAN) 2 %, Apply topically 3 (three) times daily as needed., Disp: , Rfl:    naproxen (NAPROSYN) 250 MG tablet, 1 tablet with food or milk, Disp: , Rfl:    Omega-3 Fatty Acids (FISH OIL) 1000 MG CAPS, Take 2,000 mg by mouth daily., Disp: , Rfl:    pantoprazole (PROTONIX) 20 MG tablet, TAKE 1 TABLET ONCE A DAY APPROXIMATELY 30 MINUTES BEFORE BREAKFAST, Disp: , Rfl:    Polyethyl Glycol-Propyl Glycol (SYSTANE) 0.4-0.3 % SOLN, Place 1 drop into both eyes as needed., Disp: , Rfl:    Probiotic Product (PROBIOTIC DAILY PO), Take 1 capsule by mouth daily., Disp: , Rfl:    Pumpkin Seed-Soy Germ (AZO BLADDER CONTROL/GO-LESS) CAPS, See admin instructions., Disp: , Rfl:    simvastatin (ZOCOR) 40 MG tablet, Take 40 mg by mouth every evening., Disp: , Rfl:    simvastatin (ZOCOR) 40 MG tablet, Take 1 tablet by mouth every evening., Disp: , Rfl:    sodium chloride (OCEAN) 0.65 % SOLN nasal spray, Place 1 spray into both nostrils 4 (four) times daily as needed for congestion., Disp: , Rfl:    St Johns Wort 300 MG  TABS, Take by mouth., Disp: , Rfl:    timolol (TIMOPTIC) 0.5 % ophthalmic solution, Apply to eye. (Patient not taking: Reported on 01/29/2021), Disp: , Rfl:    tiZANidine (ZANAFLEX) 2 MG tablet, Take 1 tablet (2 mg total) by mouth every 6 (six) hours as  needed for muscle spasms. (Patient not taking: Reported on 01/29/2021), Disp: 60 tablet, Rfl: 0   traZODone (DESYREL) 50 MG tablet, Take 50 mg by mouth at bedtime as needed., Disp: , Rfl:    triamcinolone cream (KENALOG) 0.1 %, Apply 1 application topically 2 (two) times daily as needed (for skin irriation/ rash/lichenoid dermatitis). (Patient not taking: Reported on 01/29/2021), Disp: , Rfl:    vitamin B-12 (CYANOCOBALAMIN) 1000 MCG tablet, Take 1,000 mcg by mouth daily., Disp: , Rfl:    Wheat Dextrin (BENEFIBER) POWD, See admin instructions. (Patient not taking: Reported on 01/29/2021), Disp: , Rfl:   Social History   Tobacco Use  Smoking Status Never  Smokeless Tobacco Never    Allergies  Allergen Reactions   Clindamycin/Lincomycin Anaphylaxis   Other Anaphylaxis and Itching   Septra [Sulfamethoxazole-Trimethoprim] Anaphylaxis   Sulfa Antibiotics Anaphylaxis   Melatonin Other (See Comments) and Hypertension    Over active    Bee Pollen Other (See Comments)    Congestion.   Cheese     When in combination with chocolate   Clindamycin Hcl     Other reaction(s): anaphylaxis   Codeine Other (See Comments)    Unknown   Horse-Derived Products Itching   Latex Hives   Mold Extract [Trichophyton] Other (See Comments)    Congestion.   Pollen Extract Other (See Comments)    Congestion.   Objective:  There were no vitals filed for this visit. There is no height or weight on file to calculate BMI. Constitutional Well developed. Well nourished.  Vascular Dorsalis pedis pulses palpable bilaterally. Posterior tibial pulses palpable bilaterally. Capillary refill normal to all digits.  No cyanosis or clubbing noted. Pedal hair growth  normal.  Neurologic Normal speech. Oriented to person, place, and time. Epicritic sensation to light touch grossly present bilaterally.  Dermatologic Painful ingrowing nail at lateral nail borders of the hallux nail right.  Mild erythema noted No other open wounds. No skin lesions.  Orthopedic: Normal joint ROM without pain or crepitus bilaterally. No visible deformities. No bony tenderness.   Radiographs: None Assessment:   1. Cellulitis and abscess of toe of right foot   2. Ingrown right big toenail    Plan:  Patient was evaluated and treated and all questions answered.  Ingrown Nail, right with underlying erythema -Patient elects to proceed with minor surgery to remove ingrown toenail removal today. Consent reviewed and signed by patient. -Ingrown nail excised. See procedure note. -Educated on post-procedure care including soaking. Written instructions provided and reviewed. -Patient to follow up in 2 weeks for nail check. -Keflex was sent to the pharmacy for skin and soft tissue prophylaxis  Procedure: Excision of Ingrown Toenail Location: Right 1st toe lateral nail borders. Anesthesia: Lidocaine 1% plain; 1.5 mL and Marcaine 0.5% plain; 1.5 mL, digital block. Skin Prep: Betadine. Dressing: Silvadene; telfa; dry, sterile, compression dressing. Technique: Following skin prep, the toe was exsanguinated and a tourniquet was secured at the base of the toe. The affected nail border was freed, split with a nail splitter, and excised. Chemical matrixectomy was then performed with phenol and irrigated out with alcohol. The tourniquet was then removed and sterile dressing applied. Disposition: Patient tolerated procedure well. Patient to return in 2 weeks for follow-up.   Return in about 2 weeks (around 08/27/2021).

## 2021-08-27 ENCOUNTER — Ambulatory Visit: Payer: Medicare Other | Admitting: Podiatry

## 2021-08-27 ENCOUNTER — Other Ambulatory Visit: Payer: Self-pay

## 2021-08-27 DIAGNOSIS — L600 Ingrowing nail: Secondary | ICD-10-CM | POA: Diagnosis not present

## 2021-08-31 NOTE — Progress Notes (Signed)
Subjective: Sophia Suarez is a 81 y.o.  female returns to office today for follow up evaluation after having right Hallux Lateral border nail avulsion performed. Patient has been soaking using epsom salt and applying topical antibiotic covered with bandaid daily. Patient denies fevers, chills, nausea, vomiting. Denies any calf pain, chest pain, SOB.   Objective:  Vitals: Reviewed  General: Well developed, nourished, in no acute distress, alert and oriented x3   Dermatology: Skin is warm, dry and supple bilateral. Lateral hallux nail border appears to be clean, dry, with mild granular tissue and surrounding scab. There is no surrounding erythema, edema, drainage/purulence. The remaining nails appear unremarkable at this time. There are no other lesions or other signs of infection present.  Neurovascular status: Intact. No lower extremity swelling; No pain with calf compression bilateral.  Musculoskeletal: Decreased tenderness to palpation of the Lateral hallux nail fold(s). Muscular strength within normal limits bilateral.   Assesement and Plan: S/p partial nail avulsion, doing well.   -Continue soaking in epsom salts twice a day followed by antibiotic ointment and a band-aid as needed. Can leave uncovered at night. .  -If the area does not heal properply, call the office for follow-up appointment, or sooner if any problems arise.  -Monitor for any signs/symptoms of infection. Call the office immediately if any occur or go directly to the emergency room. Call with any questions/concerns.  Boneta Lucks, DPM

## 2021-09-07 DIAGNOSIS — I10 Essential (primary) hypertension: Secondary | ICD-10-CM | POA: Diagnosis not present

## 2021-09-07 DIAGNOSIS — N183 Chronic kidney disease, stage 3 unspecified: Secondary | ICD-10-CM | POA: Diagnosis not present

## 2021-09-07 DIAGNOSIS — E1122 Type 2 diabetes mellitus with diabetic chronic kidney disease: Secondary | ICD-10-CM | POA: Diagnosis not present

## 2021-09-07 DIAGNOSIS — M818 Other osteoporosis without current pathological fracture: Secondary | ICD-10-CM | POA: Diagnosis not present

## 2021-09-07 DIAGNOSIS — E785 Hyperlipidemia, unspecified: Secondary | ICD-10-CM | POA: Diagnosis not present

## 2021-09-08 DIAGNOSIS — H401131 Primary open-angle glaucoma, bilateral, mild stage: Secondary | ICD-10-CM | POA: Diagnosis not present

## 2021-11-10 ENCOUNTER — Ambulatory Visit: Payer: Medicare Other | Admitting: Podiatry

## 2021-11-10 DIAGNOSIS — B351 Tinea unguium: Secondary | ICD-10-CM

## 2021-11-10 DIAGNOSIS — M79674 Pain in right toe(s): Secondary | ICD-10-CM | POA: Diagnosis not present

## 2021-11-10 DIAGNOSIS — E119 Type 2 diabetes mellitus without complications: Secondary | ICD-10-CM

## 2021-11-10 DIAGNOSIS — L600 Ingrowing nail: Secondary | ICD-10-CM

## 2021-11-10 DIAGNOSIS — Z9889 Other specified postprocedural states: Secondary | ICD-10-CM

## 2021-11-10 DIAGNOSIS — Q828 Other specified congenital malformations of skin: Secondary | ICD-10-CM

## 2021-11-10 DIAGNOSIS — M79675 Pain in left toe(s): Secondary | ICD-10-CM | POA: Diagnosis not present

## 2021-11-21 ENCOUNTER — Encounter: Payer: Self-pay | Admitting: Podiatry

## 2021-11-21 NOTE — Progress Notes (Signed)
?Subjective:  ?Patient ID: Sophia Suarez, female    DOB: 1941/05/15,  MRN: [id number] ? ?Sophia Suarez presents to clinic today for preventative diabetic foot care and painful elongated mycotic toenails 1-5 bilaterally which are tender when wearing enclosed shoe gear. Pain is relieved with periodic professional debridement. ? ?She had flexor tenotomy performed on left 2nd toe and digit is symptomatic again. She also has painful lesion distal tip of right 4th toe. ? ?She had matrixectomy lateral border right hallux performed by Dr. Allena Katz on last visit and has recovered from that. ? ?Last known HgA1c was 7.2%. She did not check her blood glucose today. ? ?New problem(s): None.  ? ?PCP is Mila Palmer, MD , and last visit was September 07, 2021. ? ?Allergies  ?Allergen Reactions  ? Clindamycin/Lincomycin Anaphylaxis  ? Other Anaphylaxis and Itching  ? Septra [Sulfamethoxazole-Trimethoprim] Anaphylaxis  ? Sulfa Antibiotics Anaphylaxis  ? Melatonin Other (See Comments) and Hypertension  ?  Over active   ? Bee Pollen Other (See Comments)  ?  Congestion.  ? Cheese   ?  When in combination with chocolate  ? Clindamycin Hcl   ?  Other reaction(s): anaphylaxis  ? Codeine Other (See Comments)  ?  Unknown  ? Horse-Derived Products Itching  ? Latex Hives  ? Mold Extract [Trichophyton] Other (See Comments)  ?  Congestion.  ? Pollen Extract Other (See Comments)  ?  Congestion.  ? ? ?Review of Systems: Negative except as noted in the HPI. ? ?Objective: No changes noted in today's physical examination. ? ?Constitutional Sophia Suarez is a pleasant 81 y.o. Caucasian female, in NAD. AAO x 3.   ?Vascular Capillary refill time to digits immediate b/l. Palpable pedal pulses b/l LE. Pedal hair present. Lower extremity skin temperature gradient within normal limits. +1 pitting edema noted BLE. No cyanosis or clubbing noted.  ?Neurologic Normal speech. Oriented to person, place, and time. Protective sensation intact 5/5 intact  bilaterally with 10g monofilament b/l. Vibratory sensation intact b/l.  ?Dermatologic Pedal skin is warm and supple b/l LE. No open wounds b/l lower extremities. No interdigital macerations b/l lower extremities. Toenails 1-5 b/l elongated, discolored, dystrophic, thickened, crumbly with subungual debris and tenderness to dorsal palpation. Incurvated nailplate left hallux bilateral borders. Nail border hypertrophy minimal. There is tenderness to palpation. Sign(s) of infection: no clinical signs of infection appreciated today. Lateral border right hallux with dried drainage and no underlying infection. Border is noted to be healed post debridement of nail border debris.  ?Orthopedic: Normal muscle strength 5/5 to all lower extremity muscle groups bilaterally. Adductovarus right 4th digit. Hammertoe deformity left 2nd digit. Pes planus deformity noted b/l lower extremities.  ? ?Radiographs: None ? ?Assessment/Plan: ?1. Pain due to onychomycosis of toenails of both feet   ?2. Porokeratosis   ?3. Pain in toe of right foot   ?4. Ingrown toenail without infection   ?5. Controlled type 2 diabetes mellitus without complication, without long-term current use of insulin (HCC)   ?  ?-Patient was evaluated and treated. All patient's and/or POA's questions/concerns answered on today's visit. ?-Patient with h/o flexor tenotomy of left 2nd digit; would like redo procedure as toe appears to be contracting again. Also lesion development on distal tip right 4th toe. She may also see [name] for her left ingrown toenail if it remains symptomatic . ?-Patient to continue soft, supportive shoe gear daily. ?-Offending nail border debrided and curretaged left great toe utilizing sterile nail nipper and currette. Border(s)  cleansed with alcohol and TAO applied. Patient instructed to apply Mupirocin Ointment  to left great toe once daily for 7 days. Call office if there are any concerns. ?-Patient/POA to call should there be  question/concern in the interim.  ? ?Return in about 3 months (around 02/10/2022). ? ?[name], DPM  ?

## 2021-11-30 ENCOUNTER — Ambulatory Visit: Payer: Medicare Other | Admitting: Podiatry

## 2021-11-30 DIAGNOSIS — M2042 Other hammer toe(s) (acquired), left foot: Secondary | ICD-10-CM

## 2021-11-30 DIAGNOSIS — M2041 Other hammer toe(s) (acquired), right foot: Secondary | ICD-10-CM

## 2021-11-30 NOTE — Patient Instructions (Signed)
Leave bandage until Thursday then replace with neosporin and band aid

## 2021-12-01 NOTE — Progress Notes (Signed)
  Subjective:  Patient ID: Sophia Suarez, female    DOB: 1941-03-02,  MRN: [id number]  Chief Complaint  Patient presents with   Hammer Toe       Flexor tenotomy right 4th toe and redo left 2nd toe.- [place] patient    81 y.o. female presents with the above complaint. History confirmed with patient.  She presents today for follow-up on left second hammertoe flexor tenotomy, the fourth toes bilateral are bothering her again now and she is interested in the same procedure.  Objective:  Physical Exam: warm, good capillary refill, no trophic changes or ulcerative lesions, normal DP and PT pulses, normal sensory exam, and semireducible hammertoe deformities of the fourth toe bilateral.  The second toe still has flexion contracture and is semirigid and is minimally reducible now.     Assessment:   1. Hammertoe of left foot   2. Hammertoe of right foot      Plan:  Patient was evaluated and treated and all questions answered.  We again discussed further treatment options for hammertoe deformities with surgical and nonsurgical.  I do not think repeat flexor tenotomy on the left second toe would offer much of that is semireducible only now.  Discussed with her if this continues to be symptomatic which so far is minimal for her that we could consider open arthroplasty and discussed the risk benefits and potential complications of this.  She will wait on this for now to see how it goes if it continues to bother her.  For the bilateral fourth toes they are still quite reducible.  I discussed flexor tenotomy with her.  Following verbal consent, the bilateral fourth toe was anesthetized with lidocaine and Marcaine.  It was prepped in aseptic technique with Betadine and a tourniquet placed around the base of the toe.  A sterile 18-gauge needle was used to create a long flexor tenotomy of both digits with excellent release of the contracture.  A sterile bandage and compression splint in dorsiflexion was  placed with Coban and gauze.  I will see her back in 4 weeks for follow-up.  Return in about 4 weeks (around 12/28/2021) for f/u tenotomies.

## 2021-12-28 ENCOUNTER — Ambulatory Visit: Payer: Medicare Other | Admitting: Podiatry

## 2021-12-28 DIAGNOSIS — M2041 Other hammer toe(s) (acquired), right foot: Secondary | ICD-10-CM

## 2021-12-28 DIAGNOSIS — M2042 Other hammer toe(s) (acquired), left foot: Secondary | ICD-10-CM

## 2021-12-28 NOTE — Progress Notes (Signed)
  Subjective:  Patient ID: Sophia Suarez, female    DOB: 12-24-1940,  MRN: [id number]  Chief Complaint  Patient presents with   Hammer Toe       4 week f/u for tenotomies     81 y.o. female returns for post-op check.  She is here for follow-up of bilateral fourth toe flexor tenotomy's they are doing well and she is pleased with the result  Review of Systems: Negative except as noted in the HPI. Denies N/V/F/Ch.   Objective:  There were no vitals filed for this visit. There is no height or weight on file to calculate BMI. Constitutional Well developed. Well nourished.  Vascular Foot warm and well perfused. Capillary refill normal to all digits.  Calf is soft and supple, no posterior calf or knee pain, negative Homans' sign  Neurologic Normal speech. Oriented to person, place, and time. Epicritic sensation to light touch grossly present bilaterally.  Dermatologic Incision is well-healed  Orthopedic: Good correction of deformity there is no tenderness or edema    Assessment:   1. Hammertoe of left foot   2. Hammertoe of right foot    Plan:  Patient was evaluated and treated and all questions answered.  Has had significant improvement with her most recent tenotomy's, no residual pain or deformity.  We did discuss that if the second toe continues to be very painful arthroplasty may offer further deformity correction.  She will return to see me for this as needed.  Return if symptoms worsen or fail to improve.

## 2022-01-26 ENCOUNTER — Ambulatory Visit (HOSPITAL_COMMUNITY)
Admission: RE | Admit: 2022-01-26 | Discharge: 2022-01-26 | Disposition: A | Discharge status: Home / Self Care | Payer: Medicare Other | Source: Ambulatory Visit | Attending: Cardiovascular Disease | Admitting: Cardiovascular Disease

## 2022-01-26 DIAGNOSIS — I771 Stricture of artery: Secondary | ICD-10-CM | POA: Diagnosis not present

## 2022-01-26 DIAGNOSIS — I6521 Occlusion and stenosis of right carotid artery: Secondary | ICD-10-CM | POA: Diagnosis not present

## 2022-02-11 ENCOUNTER — Ambulatory Visit: Payer: Medicare Other | Admitting: Podiatry

## 2022-02-23 ENCOUNTER — Ambulatory Visit (INDEPENDENT_AMBULATORY_CARE_PROVIDER_SITE_OTHER): Payer: Medicare Other | Admitting: Podiatry

## 2022-02-23 ENCOUNTER — Encounter: Payer: Self-pay | Admitting: Podiatry

## 2022-02-23 DIAGNOSIS — B351 Tinea unguium: Secondary | ICD-10-CM | POA: Diagnosis not present

## 2022-02-23 DIAGNOSIS — M79674 Pain in right toe(s): Secondary | ICD-10-CM | POA: Diagnosis not present

## 2022-02-23 DIAGNOSIS — M79675 Pain in left toe(s): Secondary | ICD-10-CM | POA: Diagnosis not present

## 2022-02-23 DIAGNOSIS — L84 Corns and callosities: Secondary | ICD-10-CM

## 2022-02-23 DIAGNOSIS — E119 Type 2 diabetes mellitus without complications: Secondary | ICD-10-CM | POA: Diagnosis not present

## 2022-02-26 ENCOUNTER — Other Ambulatory Visit: Payer: Self-pay | Admitting: Cardiovascular Disease

## 2022-02-28 ENCOUNTER — Other Ambulatory Visit: Payer: Self-pay

## 2022-02-28 NOTE — Progress Notes (Signed)
  Subjective:  Patient ID: Sophia Suarez, female    DOB: 09-19-1940,  MRN: [id number]  Sophia Suarez presents to clinic today for preventative diabetic foot care and preulcerative lesion(s) L 2nd toe and painful mycotic toenails that limit ambulation. Painful toenails interfere with ambulation. Aggravating factors include wearing enclosed shoe gear. Pain is relieved with periodic professional debridement. Painful porokeratotic lesions are aggravated when weightbearing with and without shoegear. Pain is relieved with periodic professional debridement.  She also has calluses b/l feet.  Last known HgA1c was 7.2%. Patient does not monitor blood glucose daily.  New problem(s): None.   PCP is Sophia Palmer, MD , and last visit was March 31, 2021.  Allergies  Allergen Reactions   Clindamycin/Lincomycin Anaphylaxis   Other Anaphylaxis and Itching   Septra [Sulfamethoxazole-Trimethoprim] Anaphylaxis   Sulfa Antibiotics Anaphylaxis   Melatonin Other (See Comments) and Hypertension    Over active    Bee Pollen Other (See Comments)    Congestion.   Cheese     When in combination with chocolate   Clindamycin Hcl     Other reaction(s): anaphylaxis   Codeine Other (See Comments)    Unknown   Horse-Derived Products Itching   Latex Hives   Mold Extract [Trichophyton] Other (See Comments)    Congestion.   Pollen Extract Other (See Comments)    Congestion.    Review of Systems: Negative except as noted in the HPI.  Objective: No changes noted in today's physical examination.  Vascular Examination: Palpable pedal pulses b/l LE. Digital hair present b/l. No pedal edema b/l. Skin temperature gradient WNL b/l. No varicosities b/l. +1 pitting edema noted BLE.Marland Kitchen  Dermatological Examination: Pedal skin with normal turgor, texture and tone b/l. No open wounds. No interdigital macerations b/l. Toenails 1-5 b/l thickened, discolored, dystrophic with subungual debris. There is pain on  palpation to dorsal aspect of nailplates. Hyperkeratotic lesion(s) plantar IPJ of left great toe and 1st metatarsal head b/l lower extremities.  No erythema, no edema, no drainage, no fluctuance. Preulcerative lesion noted distal tip of left 2nd toe. There is visible subdermal hemorrhage. There is no surrounding erythema, no edema, no drainage, no odor, no fluctuance..  Neurological Examination: Protective sensation intact with 10 gram monofilament b/l LE. Vibratory sensation intact b/l LE.   Musculoskeletal Examination: Muscle strength 5/5 to all LE muscle groups b/l. Hammertoe(s) noted to the L 2nd toe. Pes planus deformity noted bilateral LE.  Assessment/Plan: 1. Pain due to onychomycosis of toenails of both feet   2. Pre-ulcerative corn or callous   3. Callus   4. Controlled type 2 diabetes mellitus without complication, without long-term current use of insulin (HCC)    -Examined patient. -We discussed her left 2nd toe. She may see [name] if it continues to be symptomatic. -Patient to continue soft, supportive shoe gear daily. -Callus(es) medial IPJ of left great toe and 1st metatarsal head b/l lower extremities pared utilizing sterile scalpel blade without complication or incident. Total number debrided =3. -Preulcerative lesion pared distal tip of left 2nd toe. Total number pared=1. -Patient/POA to call should there be question/concern in the interim.   No follow-ups on file.  [name], DPM

## 2022-03-22 DIAGNOSIS — H401131 Primary open-angle glaucoma, bilateral, mild stage: Secondary | ICD-10-CM | POA: Diagnosis not present

## 2022-03-28 DIAGNOSIS — M818 Other osteoporosis without current pathological fracture: Secondary | ICD-10-CM | POA: Diagnosis not present

## 2022-03-28 DIAGNOSIS — E1122 Type 2 diabetes mellitus with diabetic chronic kidney disease: Secondary | ICD-10-CM | POA: Diagnosis not present

## 2022-03-28 DIAGNOSIS — Z79899 Other long term (current) drug therapy: Secondary | ICD-10-CM | POA: Diagnosis not present

## 2022-03-28 DIAGNOSIS — N3000 Acute cystitis without hematuria: Secondary | ICD-10-CM | POA: Diagnosis not present

## 2022-03-28 DIAGNOSIS — E785 Hyperlipidemia, unspecified: Secondary | ICD-10-CM | POA: Diagnosis not present

## 2022-03-28 DIAGNOSIS — E041 Nontoxic single thyroid nodule: Secondary | ICD-10-CM | POA: Diagnosis not present

## 2022-03-31 DIAGNOSIS — E785 Hyperlipidemia, unspecified: Secondary | ICD-10-CM | POA: Diagnosis not present

## 2022-03-31 DIAGNOSIS — M810 Age-related osteoporosis without current pathological fracture: Secondary | ICD-10-CM | POA: Diagnosis not present

## 2022-03-31 DIAGNOSIS — I10 Essential (primary) hypertension: Secondary | ICD-10-CM | POA: Diagnosis not present

## 2022-03-31 DIAGNOSIS — Z0000 Encounter for general adult medical examination without abnormal findings: Secondary | ICD-10-CM | POA: Diagnosis not present

## 2022-03-31 DIAGNOSIS — E041 Nontoxic single thyroid nodule: Secondary | ICD-10-CM | POA: Diagnosis not present

## 2022-03-31 DIAGNOSIS — K219 Gastro-esophageal reflux disease without esophagitis: Secondary | ICD-10-CM | POA: Diagnosis not present

## 2022-03-31 DIAGNOSIS — I779 Disorder of arteries and arterioles, unspecified: Secondary | ICD-10-CM | POA: Diagnosis not present

## 2022-03-31 DIAGNOSIS — E1122 Type 2 diabetes mellitus with diabetic chronic kidney disease: Secondary | ICD-10-CM | POA: Diagnosis not present

## 2022-03-31 DIAGNOSIS — Z23 Encounter for immunization: Secondary | ICD-10-CM | POA: Diagnosis not present

## 2022-03-31 DIAGNOSIS — R809 Proteinuria, unspecified: Secondary | ICD-10-CM | POA: Diagnosis not present

## 2022-03-31 DIAGNOSIS — M818 Other osteoporosis without current pathological fracture: Secondary | ICD-10-CM | POA: Diagnosis not present

## 2022-03-31 DIAGNOSIS — I351 Nonrheumatic aortic (valve) insufficiency: Secondary | ICD-10-CM | POA: Diagnosis not present

## 2022-04-08 ENCOUNTER — Other Ambulatory Visit (HOSPITAL_COMMUNITY): Payer: Self-pay | Admitting: Cardiovascular Disease

## 2022-04-08 DIAGNOSIS — I6523 Occlusion and stenosis of bilateral carotid arteries: Secondary | ICD-10-CM

## 2022-04-29 NOTE — Progress Notes (Unsigned)
Patient ID: Sophia Suarez, female   DOB: [birth year], 81 y.o.   MRN: [id number]     81 y.o. first seen by me 5/14 . Has had TIAls with borderline carotid lesion. Sees Dr Leta Baptist as well. Reviewed CTA from 4/26 and RICA has a ? Of 55-60% stenosis not typically bad enough to operate on.   No history of CAD. Active with no chest pain. Long standing murmur.   Echo7/15/21 normal EF mild AS mean gradient 12 mmhg  Carotid 01/26/21 0-59% RICA Has been on chronic plavix per neurology Thyroid nodule noted   Had left TKR [facility] 2019 March 04/23/18 Right Parotid mass removal Rosen  Right nail avulsion hallux 11/27/19   Left > right LE edema Has had another mass/infection on right side of neck been on doxycycline and seeing DR Hall/Rosen  He dog Elyse Hsu passed away has thought about getting another but wants her neck issues To be resolved   No cardiac issues  Not getting along with daughter She wants her inheritance now Son very supportive  ROS: Denies fever, malais, weight loss, blurry vision, decreased visual acuity, cough, sputum, SOB, hemoptysis, pleuritic pain, palpitaitons, heartburn, abdominal pain, melena, lower extremity edema, claudication, or rash.  All other systems reviewed and negative  General: There were no vitals taken for this visit. Affect appropriate Healthy:  appears stated age 81: post right parotid excision with right neck bandage  Neck supple with no adenopathy JVP normal no bruits no thyromegaly Lungs clear with no wheezing and good diaphragmatic motion Heart:  S1/S2 mild AS  murmur, no rub, gallop or click PMI normal Abdomen: benighn, BS positve, no tenderness, no AAA no bruit.  No HSM or HJR Distal pulses intact with no bruits No edema Neuro non-focal Skin warm and dry Post left TKR     Current Outpatient Medications  Medication Sig Dispense Refill   acetaminophen (ACETAMINOPHEN EXTRA STRENGTH) 500 MG tablet 1 tablet as needed     alendronate (FOSAMAX) 70  MG tablet Take 1 tablet by mouth once a week.     aspirin 81 MG chewable tablet 1 tablet     Biotin w/ Vitamins C & E (HAIR SKIN & NAILS GUMMIES) 1250-7.5-7.5 MCG-MG-UNT CHEW 2 gummies     Calcium Carb-Cholecalciferol 600-200 MG-UNIT TABS 2  tablets     clopidogrel (PLAVIX) 75 MG tablet Take 1 tablet by mouth daily.     Coenzyme Q10 (CO Q 10) 60 MG CAPS 1 capsule with a meal     diphenhydrAMINE (BENADRYL) 25 mg capsule 1 capsule as needed     docusate sodium (COLACE) 100 MG capsule Take 100 mg by mouth at bedtime as needed for mild constipation.     dorzolamide-timolol (COSOPT) 22.3-6.8 MG/ML ophthalmic solution      famotidine (PEPCID) 40 MG tablet Take 40 mg by mouth at bedtime.     Glucos-Chond-Hyal Ac-Ca Fructo (MOVE FREE JOINT HEALTH ADVANCE) TABS Take 2 tablets by mouth daily.     hydrochlorothiazide (HYDRODIURIL) 25 MG tablet TAKE 1 TABLET (25 MG TOTAL) BY MOUTH DAILY. 90 tablet 2   ipratropium (ATROVENT) 0.03 % nasal spray 2 sprays in each nostril     losartan (COZAAR) 100 MG tablet Take 1 tablet by mouth daily.     Multiple Vitamin (MULTIVITAMIN WITH MINERALS) TABS tablet Take 1 tablet by mouth daily. Centrum Silver     naproxen (NAPROSYN) 250 MG tablet 1 tablet with food or milk     Omega-3 Fatty Acids (FISH OIL)  1000 MG CAPS Take 2,000 mg by mouth daily.     Polyethyl Glycol-Propyl Glycol (SYSTANE) 0.4-0.3 % SOLN Place 1 drop into both eyes as needed.     Probiotic Product (PROBIOTIC DAILY PO) Take 1 capsule by mouth daily.     simvastatin (ZOCOR) 40 MG tablet Take 1 tablet by mouth every evening.     sodium chloride (OCEAN) 0.65 % SOLN nasal spray Place 1 spray into both nostrils 4 (four) times daily as needed for congestion.     vitamin B-12 (CYANOCOBALAMIN) 1000 MCG tablet Take 1,000 mcg by mouth daily.     No current facility-administered medications for this visit.    Allergies  Clindamycin/lincomycin, Other, Septra [sulfamethoxazole-trimethoprim], Sulfa antibiotics,  Melatonin, Bee pollen, Cheese, Codeine, Horse-derived products, Latex, Mold extract [trichophyton], and Pollen extract  Electrocardiogram:  04/29/2022 SR rate 67 normal   Assessment and Plan AS: mild f/u echo for AS  01/23/20 EF 60-65% mean gradient 12 peak 23 DVI 0.41 Update echo since it has been over 2 years   Carotid   40-59% RICA stenosis.01/26/22 f/u duplex in a year F/U primary regarding thyroid nodule noted Consider f/u dedicated US of thyroid   HTN: Better with diuretic  continue ARB  Discussed diet and weight loss   ENT: post right parotid mass removal F/U Pollyann Kennedy new lesion in right cervical area f/u Rosen/Hall Has finished course of doxycycline   Ortho:  Post left TKR f/u [facility]   Edema;  Dependant ? Component lymph edema increased HcTZ 25 mg daily    F/U in a year   [employer]

## 2022-05-03 ENCOUNTER — Ambulatory Visit: Payer: Medicare Other | Attending: Cardiovascular Disease | Admitting: Cardiovascular Disease

## 2022-05-03 ENCOUNTER — Encounter: Payer: Self-pay | Admitting: Cardiovascular Disease

## 2022-05-03 VITALS — BP 122/76 | HR 74 | Ht <= 58 in | Wt 147.4 lb

## 2022-05-03 DIAGNOSIS — I6521 Occlusion and stenosis of right carotid artery: Secondary | ICD-10-CM | POA: Diagnosis not present

## 2022-05-03 DIAGNOSIS — I10 Essential (primary) hypertension: Secondary | ICD-10-CM

## 2022-05-03 DIAGNOSIS — I350 Nonrheumatic aortic (valve) stenosis: Secondary | ICD-10-CM | POA: Diagnosis not present

## 2022-05-03 NOTE — Patient Instructions (Signed)
Medication Instructions:  Your physician recommends that you continue on your current medications as directed. Please refer to the Current Medication list given to you today.  *If you need a refill on your cardiac medications before your next appointment, please call your pharmacy*  Lab Work: If you have labs (blood work) drawn today and your tests are completely normal, you will receive your results only by: MyChart Message (if you have MyChart) OR A paper copy in the mail If you have any lab test that is abnormal or we need to change your treatment, we will call you to review the results.  Testing/Procedures: Your physician has requested that you have an echocardiogram. Echocardiography is a painless test that uses sound waves to create images of your heart. It provides your doctor with information about the size and shape of your heart and how well your heart's chambers and valves are working. This procedure takes approximately one hour. There are no restrictions for this procedure. Please do NOT wear cologne, perfume, aftershave, or lotions (deodorant is allowed). Please arrive 15 minutes prior to your appointment time.  Follow-Up: At [facility], you and your health needs are our priority.  As part of our continuing mission to provide you with exceptional heart care, we have created designated Provider Care Teams.  These Care Teams include your primary Cardiologist (physician) and Advanced Practice Providers (APPs -  Physician Assistants and Nurse Practitioners) who all work together to provide you with the care you need, when you need it.  We recommend signing up for the patient portal called "MyChart".  Sign up information is provided on this After Visit Summary.  MyChart is used to connect with patients for Virtual Visits (Telemedicine).  Patients are able to view lab/test results, encounter notes, upcoming appointments, etc.  Non-urgent messages can be sent to your provider as  well.   To learn more about what you can do with MyChart, go to [email or web address].    Your next appointment:   1 year(s)  The format for your next appointment:   In Person  Provider:   Peter Nishan, MD       Important Information About Sugar       

## 2022-05-18 ENCOUNTER — Ambulatory Visit (HOSPITAL_COMMUNITY): Payer: Medicare Other | Attending: Cardiovascular Disease

## 2022-05-18 DIAGNOSIS — I350 Nonrheumatic aortic (valve) stenosis: Secondary | ICD-10-CM | POA: Diagnosis not present

## 2022-05-18 LAB — ECHOCARDIOGRAM COMPLETE
AR max vel: 1.39 cm2
AV Area VTI: 1.36 cm2
AV Area mean vel: 1.33 cm2
AV Mean grad: 10 mmHg
AV Peak grad: 18.5 mmHg
Ao pk vel: 2.15 m/s
Area-P 1/2: 2.37 cm2
S' Lateral: 2 cm

## 2022-05-18 MED ORDER — PERFLUTREN LIPID MICROSPHERE
1.0000 mL | INTRAVENOUS | Status: AC | PRN
  Administered 2022-05-18: 2 mL via INTRAVENOUS

## 2022-05-31 ENCOUNTER — Encounter: Payer: Self-pay | Admitting: Podiatry

## 2022-05-31 ENCOUNTER — Ambulatory Visit: Payer: Medicare Other | Admitting: Podiatry

## 2022-05-31 DIAGNOSIS — E119 Type 2 diabetes mellitus without complications: Secondary | ICD-10-CM | POA: Diagnosis not present

## 2022-05-31 DIAGNOSIS — B351 Tinea unguium: Secondary | ICD-10-CM | POA: Diagnosis not present

## 2022-05-31 DIAGNOSIS — M79674 Pain in right toe(s): Secondary | ICD-10-CM | POA: Diagnosis not present

## 2022-05-31 DIAGNOSIS — M79675 Pain in left toe(s): Secondary | ICD-10-CM | POA: Diagnosis not present

## 2022-06-04 NOTE — Progress Notes (Signed)
  Subjective:  Patient ID: Sophia Suarez, female    DOB: [birth year],  MRN: [id number]  Sophia Suarez presents to clinic today for preventative diabetic foot care and painful thick toenails that are difficult to trim. Pain interferes with ambulation. Aggravating factors include wearing enclosed shoe gear. Pain is relieved with periodic professional debridement.  Chief Complaint  Patient presents with   foot care    Patient is here for diabetic foot care,last A1c 6.2, primary care doctor is Sophia Suarez last seen last seen October 2023   New problem(s): None.   PCP is Sophia Palmer, MD.  Allergies  Allergen Reactions   Clindamycin/Lincomycin Anaphylaxis   Other Anaphylaxis and Itching   Septra [Sulfamethoxazole-Trimethoprim] Anaphylaxis   Sulfa Antibiotics Anaphylaxis   Melatonin Other (See Comments) and Hypertension    Over active    Bee Pollen Other (See Comments)    Congestion.   Cheese     When in combination with chocolate   Codeine Other (See Comments)    Unknown   Horse-Derived Products Itching   Latex Hives   Mold Extract [Trichophyton] Other (See Comments)    Congestion.   Pollen Extract Other (See Comments)    Congestion.    Review of Systems: Negative except as noted in the HPI.  Objective: No changes noted in today's physical examination.  Sophia Suarez is a pleasant 81 y.o. female in NAD. AAO x 3.  Vascular Examination: Palpable pedal pulses b/l LE. Digital hair present b/l. No pedal edema b/l. Skin temperature gradient WNL b/l. No varicosities b/l. +1 pitting edema noted BLE.Marland Kitchen  Dermatological Examination: Pedal skin with normal turgor, texture and tone b/l. No open wounds. No interdigital macerations b/l. Toenails 1-5 b/l thickened, discolored, dystrophic with subungual debris. There is pain on palpation to dorsal aspect of nailplates.   Resolving hyperkeratotic lesion(s) plantar IPJ of left great toe and 1st metatarsal head b/l lower  extremities.  No erythema, no edema, no drainage, no fluctuance.   Neurological Examination: Protective sensation intact with 10 gram monofilament b/l LE. Vibratory sensation intact b/l LE.   Musculoskeletal Examination: Muscle strength 5/5 to all LE muscle groups b/l. Hammertoe(s) noted to the L 2nd toe. Pes planus deformity noted bilateral LE.  Assessment/Plan: 1. Pain due to onychomycosis of toenails of both feet   2. Controlled type 2 diabetes mellitus without complication, without long-term current use of insulin (HCC)     No orders of the defined types were placed in this encounter.   -Patient was evaluated and treated. All patient's and/or POA's questions/concerns answered on today's visit. -Continue supportive shoe gear daily. -Toenails 1-5 b/l were debrided in length and girth with sterile nail nippers and dremel without iatrogenic bleeding.  -Continue padding to afftected digits daily for protection. -Patient/POA to call should there be question/concern in the interim.   Return in about 3 months (around 08/31/2022).  [name], DPM

## 2022-07-20 DIAGNOSIS — E119 Type 2 diabetes mellitus without complications: Secondary | ICD-10-CM | POA: Diagnosis not present

## 2022-07-20 LAB — HM DIABETES EYE EXAM

## 2022-09-20 ENCOUNTER — Encounter: Payer: Self-pay | Admitting: Podiatry

## 2022-09-20 ENCOUNTER — Ambulatory Visit (INDEPENDENT_AMBULATORY_CARE_PROVIDER_SITE_OTHER): Payer: Medicare Other | Admitting: Podiatry

## 2022-09-20 DIAGNOSIS — M2142 Flat foot [pes planus] (acquired), left foot: Secondary | ICD-10-CM

## 2022-09-20 DIAGNOSIS — M79675 Pain in left toe(s): Secondary | ICD-10-CM

## 2022-09-20 DIAGNOSIS — M2141 Flat foot [pes planus] (acquired), right foot: Secondary | ICD-10-CM | POA: Diagnosis not present

## 2022-09-20 DIAGNOSIS — E119 Type 2 diabetes mellitus without complications: Secondary | ICD-10-CM

## 2022-09-20 DIAGNOSIS — M2042 Other hammer toe(s) (acquired), left foot: Secondary | ICD-10-CM | POA: Diagnosis not present

## 2022-09-20 DIAGNOSIS — B351 Tinea unguium: Secondary | ICD-10-CM | POA: Diagnosis not present

## 2022-09-20 DIAGNOSIS — M79674 Pain in right toe(s): Secondary | ICD-10-CM | POA: Diagnosis not present

## 2022-09-24 NOTE — Progress Notes (Signed)
ANNUAL DIABETIC FOOT EXAM  Subjective: Sophia Suarez presents today for annual diabetic foot examination.  Chief Complaint  Patient presents with   Nail Problem    DFC BS-do not check A1C-6.3 PCP-[facility] PCP VST-02/2022   Patient confirms h/o diabetes.  Patient denies any h/o foot wounds.  Risk factors: diabetes, h/o CVA, HTN, hyperlipidemia, diabetic renal disease.  Jonathon Jordan, MD is patient's PCP.  Past Medical History:  Diagnosis Date   Allergic rhinitis, seasonal    Anemia    Arthritis    Asthma    Carotid artery occlusion    Chronic kidney disease    Chronic pain    Complication of anesthesia    makes her hyper   Diabetes mellitus    no medication  diet controlled   Diverticulitis    Dyspnea    with exertion   GERD (gastroesophageal reflux disease)    Heart murmur    Hyperlipidemia    Hypertension    Lumbago    Panic attacks    Stroke [facility])    Urinary incontinence    Patient Active Problem List   Diagnosis Date Noted   Age related osteoporosis 08/13/2021   Decreased estrogen level 08/13/2021   Recurrent major depression ([place]) 08/13/2021   Senile purpura ([place]) 08/13/2021   Acute on chronic renal failure ([place]) 10/13/2020   Aortic valve disorder 10/13/2020   Atrophy of vagina 10/13/2020   Body mass index (BMI) 32.0-32.9, adult 10/13/2020   Chronic kidney disease, stage 3 unspecified ([place]) 10/13/2020   Diabetic renal disease ([place]) 10/13/2020   Constipation 10/13/2020   Disorder of arteries and arterioles, unspecified ([place]) 10/13/2020   Dysphagia 10/13/2020   Dyssomnia 10/13/2020   Essential tremor 10/13/2020   Flatulence 10/13/2020   History of hysterectomy Q000111Q   Lichen sclerosus Q000111Q   Major depression in partial remission ([place]) 10/13/2020   Mild intermittent asthma 10/13/2020   Mild recurrent major depression ([place]) 10/13/2020   Morbid obesity ([place]) 10/13/2020   Nausea and vomiting 10/13/2020   Nontoxic single thyroid  nodule 10/13/2020   Overactive bladder 10/13/2020   Primary open angle glaucoma (POAG) of both eyes, mild stage 10/13/2020   Stress incontinence (female) (female) 10/13/2020   Vitamin D deficiency 10/13/2020   Mixed incontinence 07/29/2020   Rhinorrhea 02/04/2020   Infection of skin of neck 01/02/2020   Parotid mass 04/23/2018   Parotid abscess 02/21/2018   Primary osteoarthritis of left knee 09/15/2017   Degenerative arthritis of left knee 09/12/2017   Preop cardiovascular exam 07/23/2013   Weight loss, non-intentional 07/03/2013   Aortic stenosis 05/21/2013   Occlusion and stenosis of carotid artery without mention of cerebral infarction 06/15/2012   Hypertension    Hyperlipidemia    GERD (gastroesophageal reflux disease)    Diverticulitis    Chronic pain    Lumbago    Heart murmur    Panic attacks    Stroke [facility])    Allergic rhinitis, seasonal    Carotid artery stenosis, right 11/18/2011   Past Surgical History:  Procedure Laterality Date   ABDOMINAL HYSTERECTOMY  1973   Partial   APPENDECTOMY  1958   BIOPSY THYROID  Dec 05, 2013   Tumor- Benign   COLECTOMY  123456   extent uncertain   EYE SURGERY Right October 10, 2014   Cataract   EYE SURGERY Left  October 17, 2014   Cataract   KNEE ARTHROSCOPY Left    MASS EXCISION Right 04/23/2018   Procedure: EXCISION OF SKIN MASS  WITH PRIMARY CLOSURE;  Surgeon: Izora Gala, MD;  Location: [facility];  Service: ENT;  Laterality: Right;   OVARIAN CYST SURGERY     PAROTIDECTOMY Right 04/23/2018   Procedure: Superficial Parotidectomy with nerve dissection;  Surgeon: Izora Gala, MD;  Location: [facility];  Service: ENT;  Laterality: Right;   PARTIAL HYSTERECTOMY     TOTAL KNEE ARTHROPLASTY Left 09/15/2017   Procedure: TOTAL KNEE ARTHROPLASTY;  Surgeon: Frederik Pear, MD;  Location: [place];  Service: Orthopedics;  Laterality: Left;   Current Outpatient Medications on File Prior to Visit  Medication Sig  Dispense Refill   acetaminophen (ACETAMINOPHEN EXTRA STRENGTH) 500 MG tablet 1 tablet as needed     alendronate (FOSAMAX) 70 MG tablet Take 1 tablet by mouth once a week.     aspirin 81 MG chewable tablet 1 tablet     Biotin w/ Vitamins C & E (HAIR SKIN & NAILS GUMMIES) 1250-7.5-7.5 MCG-MG-UNT CHEW 2 gummies     Calcium Carb-Cholecalciferol 600-200 MG-UNIT TABS 2  tablets     clopidogrel (PLAVIX) 75 MG tablet Take 1 tablet by mouth daily.     Coenzyme Q10 (CO Q 10) 60 MG CAPS 1 capsule with a meal     diphenhydrAMINE (BENADRYL) 25 mg capsule 1 capsule as needed     docusate sodium (COLACE) 100 MG capsule Take 100 mg by mouth at bedtime as needed for mild constipation.     dorzolamide-timolol (COSOPT) 22.3-6.8 MG/ML ophthalmic solution      famotidine (PEPCID) 40 MG tablet Take 40 mg by mouth at bedtime.     Glucos-Chond-Hyal Ac-Ca Fructo (MOVE FREE JOINT HEALTH ADVANCE) TABS Take 2 tablets by mouth daily.     hydrochlorothiazide (HYDRODIURIL) 25 MG tablet TAKE 1 TABLET (25 MG TOTAL) BY MOUTH DAILY. 90 tablet 2   ipratropium (ATROVENT) 0.03 % nasal spray 2 sprays in each nostril     losartan (COZAAR) 100 MG tablet Take 1 tablet by mouth daily.     Multiple Vitamin (MULTIVITAMIN WITH MINERALS) TABS tablet Take 1 tablet by mouth daily. Centrum Silver     Omega-3 Fatty Acids (FISH OIL) 1000 MG CAPS Take 2,000 mg by mouth daily.     Polyethyl Glycol-Propyl Glycol (SYSTANE) 0.4-0.3 % SOLN Place 1 drop into both eyes as needed.     Probiotic Product (PROBIOTIC DAILY PO) Take 1 capsule by mouth daily.     simvastatin (ZOCOR) 40 MG tablet Take 1 tablet by mouth every evening.     vitamin B-12 (CYANOCOBALAMIN) 1000 MCG tablet Take 1,000 mcg by mouth daily.     No current facility-administered medications on file prior to visit.    Allergies  Allergen Reactions   Clindamycin/Lincomycin Anaphylaxis   Other Anaphylaxis and Itching   Septra [Sulfamethoxazole-Trimethoprim] Anaphylaxis   Sulfa  Antibiotics Anaphylaxis   Melatonin Other (See Comments) and Hypertension    Over active    Bee Pollen Other (See Comments)    Congestion.   Cheese     When in combination with chocolate   Codeine Other (See Comments)    Unknown   Horse-Derived Products Itching   Latex Hives   Mold Extract [Trichophyton] Other (See Comments)    Congestion.   Pollen Extract Other (See Comments)    Congestion.   Social History   Occupational History   Occupation: Retired    Comment: [employer].  Tobacco Use   Smoking status: Never   Smokeless tobacco: Never  Vaping Use  Vaping Use: Never used  Substance and Sexual Activity   Alcohol use: Yes    Alcohol/week: 0.0 - 1.0 standard drinks of alcohol    Comment: occasionally; 1 drink monthly   Drug use: No   Sexual activity: Not on file   Family History  Problem Relation Age of Onset   Aneurysm Mother        aortic   Coronary artery disease Mother        CABG   Cancer Mother    Diabetes Mother        Amputation   Heart disease Mother        AAA   Hypertension Mother    Hyperlipidemia Mother    Cancer Father        Brain   Hypertension Father    Deep vein thrombosis Father    Heart disease Father    Hyperlipidemia Father    Hypertension Brother    Hypertension Son    Immunization History  Administered Date(s) Administered   Fluad Quad(high Dose 65+) 03/20/2019   Influenza, High Dose Seasonal PF 04/11/2018   Moderna Sars-Covid-2 Vaccination 08/01/2019, 08/26/2019, 06/25/2020   Td 11/28/2017     Review of Systems: Negative except as noted in the HPI.   Objective: There were no vitals filed for this visit.  Sophia Suarez is a pleasant 82 y.o. female in NAD. AAO X 3.  Vascular Examination: CFT <3 seconds b/l LE. Palpable DP pulse(s) b/l LE. Palpable PT pulse(s) b/l LE. Pedal hair sparse. No pain with calf compression b/l. Lower extremity skin temperature gradient within normal limits. Trace edema noted BLE. No  ischemia or gangrene noted b/l LE. No cyanosis or clubbing noted b/l LE.  Dermatological Examination: Pedal skin is warm and supple b/l LE. No open wounds b/l LE. No interdigital macerations noted b/l LE. Toenails 1-5 b/l elongated, discolored, dystrophic, thickened, crumbly with subungual debris and tenderness to dorsal palpation. No hyperkeratotic nor porokeratotic lesions present on today's visit.  Neurological Examination: Protective sensation intact 5/5 intact bilaterally with 10g monofilament b/l. Vibratory sensation intact b/l. Proprioception intact bilaterally.  Musculoskeletal Examination: Muscle strength 5/5 to all lower extremity muscle groups bilaterally. Hammertoe(s) noted to the left second digit. Pes planus deformity noted bilateral LE.  Footwear Assessment: Does the patient wear appropriate shoes? Yes. Does the patient need inserts/orthotics? No.  ADA Risk Categorization: Low Risk :  Patient has all of the following: Intact protective sensation No prior foot ulcer  No severe deformity Pedal pulses present  Assessment: 1. Pain due to onychomycosis of toenails of both feet   2. Hammertoe of left foot   3. Pes planus of both feet   4. Controlled type 2 diabetes mellitus without complication, without long-term current use of insulin ([place])   5. Encounter for diabetic foot exam [facility])     Plan: -Patient was evaluated and treated. All patient's and/or POA's questions/concerns answered on today's visit. -Diabetic foot examination performed today. -Continue foot and shoe inspections daily. Monitor blood glucose per PCP/Endocrinologist's recommendations. -Patient to continue soft, supportive shoe gear daily. -Toenails 1-5 b/l were debrided in length and girth with sterile nail nippers and dremel without iatrogenic bleeding.  -Patient/POA to call should there be question/concern in the interim. Return in about 3 months (around 12/21/2022).  Marzetta Board, DPM

## 2022-10-11 DIAGNOSIS — I779 Disorder of arteries and arterioles, unspecified: Secondary | ICD-10-CM | POA: Diagnosis not present

## 2022-10-11 DIAGNOSIS — Z8673 Personal history of transient ischemic attack (TIA), and cerebral infarction without residual deficits: Secondary | ICD-10-CM | POA: Diagnosis not present

## 2022-10-11 DIAGNOSIS — E1122 Type 2 diabetes mellitus with diabetic chronic kidney disease: Secondary | ICD-10-CM | POA: Diagnosis not present

## 2022-10-11 DIAGNOSIS — R251 Tremor, unspecified: Secondary | ICD-10-CM | POA: Diagnosis not present

## 2022-10-11 DIAGNOSIS — I10 Essential (primary) hypertension: Secondary | ICD-10-CM | POA: Diagnosis not present

## 2022-10-11 DIAGNOSIS — Z9989 Dependence on other enabling machines and devices: Secondary | ICD-10-CM | POA: Diagnosis not present

## 2022-10-11 DIAGNOSIS — N183 Chronic kidney disease, stage 3 unspecified: Secondary | ICD-10-CM | POA: Diagnosis not present

## 2022-10-11 DIAGNOSIS — R55 Syncope and collapse: Secondary | ICD-10-CM | POA: Diagnosis not present

## 2022-10-20 ENCOUNTER — Telehealth: Payer: Self-pay

## 2022-10-20 NOTE — Telephone Encounter (Signed)
We received a new referral for a patient that saw Dr Marjory Lies in 2014. The new referral is for syncope and tremors, hx of CVA. She is requesting a provider switch to Dr Terrace Arabia.

## 2022-10-20 NOTE — Telephone Encounter (Signed)
It is ok with me, last visit was 10 years ago

## 2022-11-16 DIAGNOSIS — H401131 Primary open-angle glaucoma, bilateral, mild stage: Secondary | ICD-10-CM | POA: Diagnosis not present

## 2023-01-25 ENCOUNTER — Ambulatory Visit (INDEPENDENT_AMBULATORY_CARE_PROVIDER_SITE_OTHER): Payer: Medicare Other | Admitting: Podiatry

## 2023-01-25 DIAGNOSIS — M79675 Pain in left toe(s): Secondary | ICD-10-CM | POA: Diagnosis not present

## 2023-01-25 DIAGNOSIS — M79674 Pain in right toe(s): Secondary | ICD-10-CM | POA: Diagnosis not present

## 2023-01-25 DIAGNOSIS — B351 Tinea unguium: Secondary | ICD-10-CM | POA: Diagnosis not present

## 2023-01-25 DIAGNOSIS — E119 Type 2 diabetes mellitus without complications: Secondary | ICD-10-CM | POA: Diagnosis not present

## 2023-01-26 ENCOUNTER — Telehealth: Payer: Self-pay | Admitting: Neurology

## 2023-01-26 ENCOUNTER — Encounter: Payer: Self-pay | Admitting: Neurology

## 2023-01-26 ENCOUNTER — Ambulatory Visit: Payer: Medicare Other | Admitting: Neurology

## 2023-01-26 VITALS — BP 116/72 | HR 67 | Ht 63.0 in | Wt 146.0 lb

## 2023-01-26 DIAGNOSIS — G459 Transient cerebral ischemic attack, unspecified: Secondary | ICD-10-CM | POA: Diagnosis not present

## 2023-01-26 DIAGNOSIS — R269 Unspecified abnormalities of gait and mobility: Secondary | ICD-10-CM

## 2023-01-26 NOTE — Progress Notes (Signed)
Chief Complaint  Patient presents with   New Patient (Initial Visit)    Rm 15, alone, tremors, syncope, possible TIA's      ASSESSMENT AND PLAN  Sophia Suarez is a 82 y.o. female   Sudden onset left side paresthesia and weakness on January 16, 2023,  Most worrisome for right subcortical stroke  MRI of brain  Vascular risk factor of aging, hypertension, hyperlipidemia, history of carotid stenosis  Ultrasound of carotid artery  She was on both aspirin and Plavix, no indication for dual antiplatelet agent treatment, will stop aspirin,  Gait abnormality,  Multifactorial, aging, scoliosis, left knee replacement, peripheral neuropathy  Referred to physical therapy  Return To Clinic With NP In 6 Months     DIAGNOSTIC DATA (LABS, IMAGING, TESTING) - I reviewed patient records, labs, notes, testing and imaging myself where available.   MEDICAL HISTORY:  Sophia Suarez is a 82 year old female, seen in request by her primary care physician Dr. Paulino Rily, Jasmine December for evaluation of left-sided paresthesia, initial evaluation was on January 26, 2023   I reviewed and summarized the referring note. PMHX. HTN HLD   She lives alone, complains of frequent wakening during sleep, about once every hour, using bathroom, also often wake up with early morning headache lasting for 1 hour, excessive daytime sleepiness, fatigue  On January 16, 2023 around 3 AM, she woke up, felt that her left arm and the neck numbness, also left face numbness, looked herself in the mirror and noticed left lower face weakness  She went to sleep, after she got up next morning, most of her left-sided symptoms has improved with exception of mild left facial numbness and asymmetry that will last for 3 days  At baseline she ambulated with a cane, history of scoliosis left knee replacement,  I reviewed her cardiologist Dr. Fabio Bering visit in July 2023, mild aortic stenosis, with ejection fraction 60 to 65%, right internal  carotid artery 40 to 59% stenosis, there was no history of coronary artery disease,  Somehow she is taking Plavix 75+ aspirin 81 mg for many years,  Laboratory evaluation in April 2024 A1c 6.2, CMP, with mild elevated calcium 11.4 CBC hemoglobin of 12.9,  PHYSICAL EXAM:   Vitals:   01/26/23 1122  BP: 116/72  Pulse: 67  SpO2: 95%  Weight: 146 lb (66.2 kg)  Height: 5\' 3"  (1.6 m)   Body mass index is 25.86 kg/m.  PHYSICAL EXAMNIATION:  Gen: NAD, conversant, well nourised, well groomed                     Cardiovascular: Regular rate rhythm, no peripheral edema, warm, nontender. Eyes: Conjunctivae clear without exudates or hemorrhage Neck: Supple, no carotid bruits. Pulmonary: Clear to auscultation bilaterally   NEUROLOGICAL EXAM:  MENTAL STATUS: Speech/cognition: Awake, alert, oriented to history taking and casual conversation CRANIAL NERVES: CN II: Visual fields are full to confrontation. Pupils are round equal and briskly reactive to light. CN III, IV, VI: extraocular movement are normal. No ptosis. CN V: Facial sensation is intact to light touch CN VII: Face is symmetric with normal eye closure  CN VIII: Hearing is normal to causal conversation. CN IX, X: Phonation is normal. CN XI: Head turning and shoulder shrug are intact  MOTOR: There is no pronator drift of out-stretched arms. Muscle bulk and tone are normal. Muscle strength is normal.  REFLEXES: Reflexes are 2+ and symmetric at the biceps, triceps, knees, and ankles. Plantar responses are flexor.  SENSORY: Length-dependent decreased light touch, pinprick, vibratory sensation to distal shin level  COORDINATION: There is no trunk or limb dysmetria noted.  GAIT/STANCE: Need push-up to get up from seated position, cautious, scoliosis,.  REVIEW OF SYSTEMS:  Full 14 system review of systems performed and notable only for as above All other review of systems were negative.   ALLERGIES: Allergies  Allergen  Reactions   Clindamycin/Lincomycin Anaphylaxis   Other Anaphylaxis and Itching   Septra [Sulfamethoxazole-Trimethoprim] Anaphylaxis   Sulfa Antibiotics Anaphylaxis   Melatonin Other (See Comments) and Hypertension    Over active    Bee Pollen Other (See Comments)    Congestion.   Cheese     When in combination with chocolate   Codeine Other (See Comments)    Unknown   Horse-Derived Products Itching   Latex Hives   Mold Extract [Trichophyton] Other (See Comments)    Congestion.   Pollen Extract Other (See Comments)    Congestion.    HOME MEDICATIONS: Current Outpatient Medications  Medication Sig Dispense Refill   acetaminophen (ACETAMINOPHEN EXTRA STRENGTH) 500 MG tablet Take 1,000 mg by mouth 2 (two) times daily.     alendronate (FOSAMAX) 70 MG tablet Take 1 tablet by mouth once a week.     aspirin EC 81 MG tablet Take 81 mg by mouth daily. Swallow whole.     clopidogrel (PLAVIX) 75 MG tablet Take 1 tablet by mouth daily.     Coenzyme Q10 (CO Q 10) 60 MG CAPS 1 capsule with a meal     diphenhydrAMINE (BENADRYL) 25 mg capsule 1 capsule as needed     dorzolamide-timolol (COSOPT) 22.3-6.8 MG/ML ophthalmic solution      famotidine (PEPCID) 40 MG tablet Take 40 mg by mouth at bedtime.     Glucos-Chond-Hyal Ac-Ca Fructo (MOVE FREE JOINT HEALTH ADVANCE) TABS Take 2 tablets by mouth daily.     ipratropium (ATROVENT) 0.03 % nasal spray 2 sprays in each nostril     Multiple Vitamin (MULTIVITAMIN WITH MINERALS) TABS tablet Take 1 tablet by mouth daily. Centrum Silver     Omega-3 Fatty Acids (FISH OIL) 1000 MG CAPS Take 2,000 mg by mouth daily.     Polyethyl Glycol-Propyl Glycol (SYSTANE) 0.4-0.3 % SOLN Place 1 drop into both eyes as needed.     simvastatin (ZOCOR) 40 MG tablet Take 1 tablet by mouth every evening.     valsartan-hydrochlorothiazide (DIOVAN-HCT) 160-12.5 MG tablet Take 1 tablet by mouth daily.     vitamin B-12 (CYANOCOBALAMIN) 1000 MCG tablet Take 1,000 mcg by mouth daily.      aspirin 81 MG chewable tablet 1 tablet (Patient not taking: Reported on 01/26/2023)     Biotin w/ Vitamins C & E (HAIR SKIN & NAILS GUMMIES) 1250-7.5-7.5 MCG-MG-UNT CHEW 2 gummies     Calcium Carb-Cholecalciferol 600-200 MG-UNIT TABS 2  tablets     docusate sodium (COLACE) 100 MG capsule Take 100 mg by mouth at bedtime as needed for mild constipation.     hydrochlorothiazide (HYDRODIURIL) 25 MG tablet TAKE 1 TABLET (25 MG TOTAL) BY MOUTH DAILY. 90 tablet 2   losartan (COZAAR) 100 MG tablet Take 1 tablet by mouth daily.     Probiotic Product (PROBIOTIC DAILY PO) Take 1 capsule by mouth daily.     No current facility-administered medications for this visit.    PAST MEDICAL HISTORY: Past Medical History:  Diagnosis Date   Allergic rhinitis, seasonal    Anemia    Arthritis  Asthma    Carotid artery occlusion    Chronic kidney disease    Chronic pain    Complication of anesthesia    makes her hyper   Diabetes mellitus    no medication  diet controlled   Diverticulitis    Dyspnea    with exertion   GERD (gastroesophageal reflux disease)    Heart murmur    Hyperlipidemia    Hypertension    Lumbago    Panic attacks    Stroke [facility])    Urinary incontinence     PAST SURGICAL HISTORY: Past Surgical History:  Procedure Laterality Date   ABDOMINAL HYSTERECTOMY  1973   Partial   APPENDECTOMY  1958   BIOPSY THYROID  Dec 05, 2013   Tumor- Benign   COLECTOMY  2003   extent uncertain   EYE SURGERY Right October 10, 2014   Cataract   EYE SURGERY Left  October 17, 2014   Cataract   KNEE ARTHROSCOPY Left    MASS EXCISION Right 04/23/2018   Procedure: EXCISION OF SKIN MASS WITH PRIMARY CLOSURE;  Surgeon: Serena Colonel, MD;  Location: [facility];  Service: ENT;  Laterality: Right;   OVARIAN CYST SURGERY     PAROTIDECTOMY Right 04/23/2018   Procedure: Superficial Parotidectomy with nerve dissection;  Surgeon: Serena Colonel, MD;  Location: [facility];   Service: ENT;  Laterality: Right;   PARTIAL HYSTERECTOMY     TOTAL KNEE ARTHROPLASTY Left 09/15/2017   Procedure: TOTAL KNEE ARTHROPLASTY;  Surgeon: Gean Birchwood, MD;  Location: [facility];  Service: Orthopedics;  Laterality: Left;    FAMILY HISTORY: Family History  Problem Relation Age of Onset   Aneurysm Mother        aortic   Coronary artery disease Mother        CABG   Cancer Mother    Diabetes Mother        Amputation   Heart disease Mother        AAA   Hypertension Mother    Hyperlipidemia Mother    Cancer Father        Brain   Hypertension Father    Deep vein thrombosis Father    Heart disease Father    Hyperlipidemia Father    Hypertension Brother    Hypertension Son     SOCIAL HISTORY: Social History   Socioeconomic History   Marital status: Divorced    Spouse name: Not on file   Number of children: 2   Years of education: College   Highest education level: Not on file  Occupational History   Occupation: Retired    Comment: [employer].  Tobacco Use   Smoking status: Never   Smokeless tobacco: Never  Vaping Use   Vaping status: Never Used  Substance and Sexual Activity   Alcohol use: Yes    Alcohol/week: 0.0 - 1.0 standard drinks of alcohol    Comment: occasionally; 1 drink monthly   Drug use: No   Sexual activity: Not on file  Other Topics Concern   Not on file  Social History Narrative   Patient lives at home alone.Caffeine Use: 1-2 cups daily   Right handed   Social Determinants of Health   Financial Resource Strain: Not on file  Food Insecurity: Not on file  Transportation Needs: Not on file  Physical Activity: Not on file  Stress: Not on file  Social Connections: Not on file  Intimate Partner Violence: Not on file  Levert Feinstein, M.D. Ph.D.  [facility] [address], [address] [place], [place] [address] Ph: [phone] Fax: [phone]  CC:  Mila Palmer, MD [address] [place],  [place]  [address]  Mila Palmer, MD

## 2023-01-26 NOTE — Telephone Encounter (Signed)
[employer] medicare NPR sent to [facility] [phone] 

## 2023-01-30 ENCOUNTER — Encounter: Payer: Self-pay | Admitting: Podiatry

## 2023-01-30 NOTE — Progress Notes (Signed)
  Subjective:  Patient ID: Sophia Suarez, female    DOB: Nov 09, 1940,  MRN: [id number]  Sophia Suarez presents to clinic today for: preventative diabetic foot care and painful thick toenails that are difficult to trim. Pain interferes with ambulation. Aggravating factors include wearing enclosed shoe gear. Pain is relieved with periodic professional debridement.  Chief Complaint  Patient presents with   Nail Problem    DFC,Referring Provider Mila Palmer, MD,lov:04/24       PCP is Mila Palmer, MD.  Allergies  Allergen Reactions   Clindamycin/Lincomycin Anaphylaxis   Other Anaphylaxis and Itching   Septra [Sulfamethoxazole-Trimethoprim] Anaphylaxis   Sulfa Antibiotics Anaphylaxis   Melatonin Other (See Comments) and Hypertension    Over active    Bee Pollen Other (See Comments)    Congestion.   Cheese     When in combination with chocolate   Codeine Other (See Comments)    Unknown   Horse-Derived Products Itching   Latex Hives   Mold Extract [Trichophyton] Other (See Comments)    Congestion.   Pollen Extract Other (See Comments)    Congestion.    Review of Systems: Negative except as noted in the HPI.  Objective: No changes noted in today's physical examination. Vitals:   Sophia Suarez is a pleasant 82 y.o. female in NAD. AAO x 3.  Vascular Examination: Capillary refill time <3 seconds b/l LE. Palpable pedal pulses b/l LE. Digital hair present b/l. Trace pedal edema b/l. Skin temperature gradient WNL b/l. No varicosities b/l. No cyanosis or clubbing noted b/l LE.Marland Kitchen  Dermatological Examination: Pedal skin with normal turgor, texture and tone b/l. No open wounds. No interdigital macerations b/l. Toenails 1-5 b/l thickened, discolored, dystrophic with subungual debris. There is pain on palpation to dorsal aspect of nailplates. No corns, calluses nor porokeratotic lesions noted..  Neurological Examination: Protective sensation intact with 10 gram  monofilament b/l LE. Vibratory sensation intact b/l LE.   Musculoskeletal Examination: Normal muscle strength 5/5 to all lower extremity muscle groups bilaterally. No pain, crepitus or joint limitation noted with ROM b/l LE. Pes planus deformity noted bilateral LE.Marland Kitchen Patient ambulates with cane assistance.  Assessment/Plan: 1. Pain due to onychomycosis of toenails of both feet   2. Controlled type 2 diabetes mellitus without complication, without long-term current use of insulin (HCC)     -Consent given for treatment as described below: -Examined patient. -Patient to continue soft, supportive shoe gear daily. -Mycotic toenails 1-5 bilaterally were debrided in length and girth with sterile nail nippers and dremel without incident. -Patient/POA to call should there be question/concern in the interim.   Return in about 3 months (around 04/27/2023).  [name], DPM

## 2023-02-09 ENCOUNTER — Encounter: Payer: Self-pay | Admitting: Podiatry

## 2023-02-09 ENCOUNTER — Ambulatory Visit (INDEPENDENT_AMBULATORY_CARE_PROVIDER_SITE_OTHER): Payer: Medicare Other

## 2023-02-09 ENCOUNTER — Ambulatory Visit: Payer: Medicare Other | Admitting: Podiatry

## 2023-02-09 DIAGNOSIS — M2012 Hallux valgus (acquired), left foot: Secondary | ICD-10-CM

## 2023-02-09 DIAGNOSIS — M21612 Bunion of left foot: Secondary | ICD-10-CM | POA: Diagnosis not present

## 2023-02-09 DIAGNOSIS — M205X2 Other deformities of toe(s) (acquired), left foot: Secondary | ICD-10-CM | POA: Diagnosis not present

## 2023-02-09 DIAGNOSIS — M2042 Other hammer toe(s) (acquired), left foot: Secondary | ICD-10-CM

## 2023-02-10 ENCOUNTER — Ambulatory Visit (HOSPITAL_COMMUNITY)
Admission: RE | Admit: 2023-02-10 | Discharge: 2023-02-10 | Disposition: A | Discharge status: Home / Self Care | Payer: Medicare Other | Source: Ambulatory Visit | Attending: Cardiovascular Disease | Admitting: Cardiovascular Disease

## 2023-02-10 DIAGNOSIS — G459 Transient cerebral ischemic attack, unspecified: Secondary | ICD-10-CM | POA: Insufficient documentation

## 2023-02-10 NOTE — Progress Notes (Signed)
40-59% RICA stenosis.  F/U duplex in 1 year.

## 2023-02-12 ENCOUNTER — Encounter: Payer: Self-pay | Admitting: Podiatry

## 2023-02-12 NOTE — Progress Notes (Signed)
  Subjective:  Patient ID: Sophia Suarez, female    DOB: 11-13-1940,  MRN: [id number]  Chief Complaint  Patient presents with   Foot Pain    "It's doing much better since Dr. Eloy End gave me something to go underneath my toes."     82 y.o. female returns for post-op check.  Doing much better after utilizing callus offloading pad   Review of Systems: Negative except as noted in the HPI. Denies N/V/F/Ch.   Objective:  There were no vitals filed for this visit. There is no height or weight on file to calculate BMI. Constitutional Well developed. Well nourished.  Vascular Foot warm and well perfused. Capillary refill normal to all digits.  Calf is soft and supple, no posterior calf or knee pain, negative Homans' sign  Neurologic Normal speech. Oriented to person, place, and time. Epicritic sensation to light touch grossly present bilaterally.  Dermatologic Plantar first MTPJ callus  Orthopedic: Hallux limitus noted prominent sesamoids and hallux valgus deformity noted   Radiograph shows significant hallux valgus deformity with degenerative changes,  Assessment:   1. Hallux limitus of left foot   2. Hallux valgus with bunions, left    Plan:  Patient was evaluated and treated and all questions answered.  I reviewed her x-rays.  She has hallux limitus and hallux valgus deformity with a painful plantar first MPJ callus.  Dr. Eloy End has placed an offloading callus pad and this has been helpful.  She will continue utilize this to properly offload the painful area that seems to be controlling it for her.  Likely would be difficult to limiting from a surgical standpoint as it would require sesamoidectomy and first MPJ arthrodesis.  There are no signs of infection or impending ulceration so we will continue with a nonoperative treatment plan at this point.  She will return to see me as needed.  No follow-ups on file.

## 2023-02-18 ENCOUNTER — Ambulatory Visit
Admission: RE | Admit: 2023-02-18 | Discharge: 2023-02-18 | Disposition: A | Discharge status: Home / Self Care | Payer: Medicare Other | Source: Ambulatory Visit | Attending: Neurology | Admitting: Neurology

## 2023-02-18 DIAGNOSIS — G459 Transient cerebral ischemic attack, unspecified: Secondary | ICD-10-CM

## 2023-02-21 ENCOUNTER — Ambulatory Visit: Payer: Medicare Other | Attending: Neurology | Admitting: Physical Therapy

## 2023-02-21 ENCOUNTER — Encounter: Payer: Self-pay | Admitting: Physical Therapy

## 2023-02-21 ENCOUNTER — Other Ambulatory Visit: Payer: Self-pay

## 2023-02-21 VITALS — BP 167/82 | HR 65

## 2023-02-21 DIAGNOSIS — R2689 Other abnormalities of gait and mobility: Secondary | ICD-10-CM | POA: Insufficient documentation

## 2023-02-21 DIAGNOSIS — G459 Transient cerebral ischemic attack, unspecified: Secondary | ICD-10-CM | POA: Diagnosis not present

## 2023-02-21 DIAGNOSIS — M6281 Muscle weakness (generalized): Secondary | ICD-10-CM | POA: Diagnosis not present

## 2023-02-21 DIAGNOSIS — R269 Unspecified abnormalities of gait and mobility: Secondary | ICD-10-CM | POA: Insufficient documentation

## 2023-02-21 DIAGNOSIS — R2681 Unsteadiness on feet: Secondary | ICD-10-CM | POA: Insufficient documentation

## 2023-02-21 NOTE — Therapy (Signed)
OUTPATIENT PHYSICAL THERAPY NEURO EVALUATION   Patient Name: Sophia Suarez MRN: [id number] DOB:1940/11/11, 82 y.o., female Today's Date: 02/21/2023   PCP: Mila Palmer, MD REFERRING PROVIDER: Levert Feinstein, MD  END OF SESSION:  PT End of Session - 02/21/23 1506     Visit Number 1    Number of Visits 9   8 + eval   Date for PT Re-Evaluation 05/05/23   pushed out due to scheduling delay   Authorization Type [employer] MEDICARE    Progress Note Due on Visit 10    PT Start Time 1451    PT Stop Time 1542    PT Time Calculation (min) 51 min    Equipment Utilized During Treatment Gait belt    Behavior During Therapy WFL for tasks assessed/performed             Past Medical History:  Diagnosis Date   Allergic rhinitis, seasonal    Anemia    Arthritis    Asthma    Carotid artery occlusion    Chronic kidney disease    Chronic pain    Complication of anesthesia    makes her hyper   Diabetes mellitus    no medication  diet controlled   Diverticulitis    Dyspnea    with exertion   GERD (gastroesophageal reflux disease)    Heart murmur    Hyperlipidemia    Hypertension    Lumbago    Panic attacks    Stroke [facility])    Urinary incontinence    Past Surgical History:  Procedure Laterality Date   ABDOMINAL HYSTERECTOMY  1973   Partial   APPENDECTOMY  1958   BIOPSY THYROID  Dec 05, 2013   Tumor- Benign   COLECTOMY  2003   extent uncertain   EYE SURGERY Right October 10, 2014   Cataract   EYE SURGERY Left  October 17, 2014   Cataract   KNEE ARTHROSCOPY Left    MASS EXCISION Right 04/23/2018   Procedure: EXCISION OF SKIN MASS WITH PRIMARY CLOSURE;  Surgeon: Serena Colonel, MD;  Location: [facility];  Service: ENT;  Laterality: Right;   OVARIAN CYST SURGERY     PAROTIDECTOMY Right 04/23/2018   Procedure: Superficial Parotidectomy with nerve dissection;  Surgeon: Serena Colonel, MD;  Location: [facility];  Service: ENT;  Laterality: Right;   PARTIAL  HYSTERECTOMY     TOTAL KNEE ARTHROPLASTY Left 09/15/2017   Procedure: TOTAL KNEE ARTHROPLASTY;  Surgeon: Gean Birchwood, MD;  Location: [facility] OR;  Service: Orthopedics;  Laterality: Left;   Patient Active Problem List   Diagnosis Date Noted   TIA (transient ischemic attack) 01/26/2023   Gait abnormality 01/26/2023   Age related osteoporosis 08/13/2021   Decreased estrogen level 08/13/2021   Recurrent major depression (HCC) 08/13/2021   Senile purpura (HCC) 08/13/2021   Acute on chronic renal failure (HCC) 10/13/2020   Aortic valve disorder 10/13/2020   Atrophy of vagina 10/13/2020   Body mass index (BMI) 32.0-32.9, adult 10/13/2020   Chronic kidney disease, stage 3 unspecified (HCC) 10/13/2020   Diabetic renal disease (HCC) 10/13/2020   Constipation 10/13/2020   Disorder of arteries and arterioles, unspecified (HCC) 10/13/2020   Dysphagia 10/13/2020   Dyssomnia 10/13/2020   Essential tremor 10/13/2020   Flatulence 10/13/2020   History of hysterectomy 10/13/2020   Lichen sclerosus 10/13/2020   Major depression in partial remission (HCC) 10/13/2020   Mild intermittent asthma 10/13/2020   Mild recurrent major depression (HCC) 10/13/2020  Morbid obesity (HCC) 10/13/2020   Nausea and vomiting 10/13/2020   Nontoxic single thyroid nodule 10/13/2020   Overactive bladder 10/13/2020   Primary open angle glaucoma (POAG) of both eyes, mild stage 10/13/2020   Stress incontinence (female) (female) 10/13/2020   Vitamin D deficiency 10/13/2020   Mixed incontinence 07/29/2020   Rhinorrhea 02/04/2020   Infection of skin of neck 01/02/2020   Parotid mass 04/23/2018   Parotid abscess 02/21/2018   Primary osteoarthritis of left knee 09/15/2017   Degenerative arthritis of left knee 09/12/2017   Preop cardiovascular exam 07/23/2013   Weight loss, non-intentional 07/03/2013   Aortic stenosis 05/21/2013   Occlusion and stenosis of carotid artery without mention of cerebral infarction 06/15/2012    Hypertension    Hyperlipidemia    GERD (gastroesophageal reflux disease)    Diverticulitis    Chronic pain    Lumbago    Heart murmur    Panic attacks    Stroke [facility])    Allergic rhinitis, seasonal    Carotid artery stenosis, right 11/18/2011    ONSET DATE: 01/16/2023 at 3am  REFERRING DIAG: G45.9 (ICD-10-CM) - TIA (transient ischemic attack) R26.9 (ICD-10-CM) - Gait abnormality  THERAPY DIAG:  Other abnormalities of gait and mobility  Muscle weakness (generalized)  Unsteadiness on feet  Rationale for Evaluation and Treatment: Rehabilitation  SUBJECTIVE:                                                                                                                                                                                             SUBJECTIVE STATEMENT: TIA symptoms have resolved and her balance problems are baseline.  She feels her prior left knee replacement has impacted her more than anything as well as her arthritis in her back. Pt accompanied by: self - she drives herself  PERTINENT HISTORY: Scoliosis, L TKA, peripheral neuropathy, HLD, HTN, Bilateral Carotid stenosis (R worse), GERD, aortic stenosis, CKD stage 3, osteoporosis, depression, DM2 (diet controlled per patient)  Patient had sudden onset of left sided paresthesia and weakness with notable facial droop in early morning hours of January 16, 2023.  She did not seek emergency attention, but scheduled a doctor's appt to follow-up.  PAIN:  Are you having pain? Yes: NPRS scale: 6/10 Pain location: low back Pain description: deep pain Aggravating factors: standing Relieving factors: sitting or laying down  PRECAUTIONS: Fall; pt wears glasses to drive and read, hears best on left side; bilateral carotid stenosis (R worse than L)  RED FLAGS: None   WEIGHT BEARING RESTRICTIONS: No  FALLS: Has patient fallen in last 6 months? No - several near misses as she  does not use the cane in her house due to  clutter/furniture  LIVING ENVIRONMENT: Lives with: lives alone Lives in: House/apartment Stairs: Yes: Internal: 15 steps; can reach both and into basement area where washer/dryer are and External: 2 steps; on right going up Has following equipment at home: Single point cane, Walker - 2 wheeled, Tour manager, and Grab bars  PLOF: Independent and Independent with community mobility with device - getting off low surfaces like couch are hard  PATIENT GOALS: "To maintain my independence!"  OBJECTIVE:   DIAGNOSTIC FINDINGS:  Brain MRI 02/18/2023 IMPRESSION: MRI scan of the brain without contrast showing mild age-related changes of chronic small vessel disease.  No acute abnormalities noted.  None from time of TIA accessible to PT.  COGNITION: Overall cognitive status: Within functional limits for tasks assessed   SENSATION: Light touch: WFL and more tingling on LLE  COORDINATION: LE RAMS:  WNL BLE Heel-to-shin:  WNL  EDEMA:  Baseline ankle edema bilaterally, not wearing compression stockings, medically managed  MUSCLE TONE: None noted in BLE  POSTURE: rounded shoulders, forward head, increased thoracic kyphosis, and left weight shift from left scoliosis  LOWER EXTREMITY ROM:     Active  Right Eval Left Eval  Hip flexion Grossly WNL  Hip extension   Hip abduction   Hip adduction   Hip internal rotation   Hip external rotation   Knee flexion   Knee extension   Ankle dorsiflexion   Ankle plantarflexion    Ankle inversion    Ankle eversion     (Blank rows = not tested)  LOWER EXTREMITY MMT:    MMT Right Eval Left Eval  Hip flexion 3+/5 3/5  Hip extension    Hip abduction    Hip adduction    Hip internal rotation    Hip external rotation    Knee flexion    Knee extension 4-/5 3+/5  Ankle dorsiflexion 4/5 4/5  Ankle plantarflexion    Ankle inversion    Ankle eversion    (Blank rows = not tested)  BED MOBILITY:  Sit to supine Complete Independence Supine  to sit Complete Independence Rolling to Right Complete Independence Rolling to Left Complete Independence  TRANSFERS: Assistive device utilized: Single point cane  Sit to stand: Modified independence Stand to sit: Modified independence Chair to chair: SBA  GAIT: Gait pattern: decreased arm swing- Left, decreased step length- Left, decreased stance time- Left, decreased stride length, knee flexed in stance- Left, trunk flexed, and narrow BOS Distance walked: various clinic distances Assistive device utilized: Single point cane Level of assistance: SBA  FUNCTIONAL TESTS:  5 times sit to stand: 12.22 seconds BUE support Timed up and go (TUG): 16.07 seconds w/ SPC SBA 6 minute walk test: To be assessed. 10 meter walk test: To be assessed.  PATIENT SURVEYS:  ABC scale To be assessed.  TODAY'S TREATMENT:  DATE: N/A - eval only.   PATIENT EDUCATION: Education details: PT POC, assessments used and their interpretations, and goals to be set with future assessments to be used.  Encouraged patient to consider de-cluttering her main walkways in home so she can use a cane vs furniture walking as she states this is the reason she keeps so much furniture around her home.  Educated on having good lighting especially for nighttime bathroom breaks. Person educated: Patient Education method: Explanation Education comprehension: verbalized understanding  HOME EXERCISE PROGRAM: To be established.  GOALS: Goals reviewed with patient? Yes  SHORT TERM GOALS: Target date: 03/24/2023  Pt will be independent and compliant with initial strength and balance HEP in order to maintain functional progress and improve mobility. Baseline: To be established. Goal status: INITIAL  2.  Pt will decrease 5xSTS to </=12 seconds w/o UE support in order to demonstrate decreased risk for falls  and improved functional bilateral LE strength and power. Baseline: 12.22 sec w/ BUE support Goal status: INITIAL  3.  Pt will demonstrate TUG of </=13 seconds in order to decrease risk of falls and improve functional mobility using LRAD. Baseline: 16.07 seconds SBA w/ SPC Goal status: INITIAL  4.  to be assessed w/ STG/LTG set as appropriate. Baseline: To be assessed. Goal status: INITIAL  LONG TERM GOALS: Target date: 04/21/2023  Pt will be independent and compliant with advanced strength and balance HEP in order to maintain functional progress and improve mobility. Baseline: To be established. Goal status: INITIAL  2.  to be assessed w/ LTG set as appropriate. Baseline:  To be assessed. Goal status: INITIAL  3.  to be assessed w/ STG/LTG set as appropriate. Baseline: To be assessed. Goal status: INITIAL  4.  ABC scale to be assessed w/ LTG set as appropriate. Baseline: To be assessed. Goal status: INITIAL  ASSESSMENT:  CLINICAL IMPRESSION: Patient is an 82 y.o. female who was seen today for physical therapy evaluation and treatment for imbalance and gait abnormality.  She is thought to have had a recent TIA affecting her left side.  Pt has a significant PMH of scoliosis, L TKA, peripheral neuropathy, HLD, HTN, bilateral carotid stenosis (R worse), GERD, aortic stenosis, CKD stage 3, osteoporosis, depression, and DM2.  Identified impairments include left LE tingling, bilateral ankle edema, left rib hump with increased kyphosis and forward head posture, and BLE weakness.  She uses a SPC at baseline to ambulate and demonstrates a narrowed BOS, increased cadence, and maintains mild left knee flexion in stance phase.  Patient has chronic low back pain not stemming from current issue, but this may be contributing to her imbalance so will address as needed.  Evaluation via the following assessment tools: 5xSTS and normal TUG indicate an elevated fall risk.  and  to be assessed at coming visit to further determine limitations in endurance and fall risk.  Will also assess ABC scale for better insight into patient's fear of falling.  She would benefit from skilled PT to address impairments as noted and progress towards long term goals.  OBJECTIVE IMPAIRMENTS: Abnormal gait, decreased activity tolerance, decreased balance, decreased endurance, decreased knowledge of condition, difficulty walking, decreased strength, increased edema, impaired flexibility, impaired sensation, improper body mechanics, postural dysfunction, and pain.   ACTIVITY LIMITATIONS: carrying, lifting, bending, standing, squatting, stairs, transfers, reach over head, and locomotion level  PARTICIPATION LIMITATIONS: meal prep, cleaning, laundry, shopping, community activity, and yard work  PERSONAL FACTORS: Age, Fitness, Past/current experiences,  and 3+ comorbidities: left TKA, HTN, DM2/peripheral neuropathy  are also affecting patient's functional outcome.   REHAB POTENTIAL: Good  CLINICAL DECISION MAKING: Evolving/moderate complexity  EVALUATION COMPLEXITY: Moderate  PLAN:  PT FREQUENCY: 1x/week  PT DURATION: 8 weeks  PLANNED INTERVENTIONS: Therapeutic exercises, Therapeutic activity, Neuromuscular re-education, Balance training, Gait training, Patient/Family education, Self Care, Stair training, Vestibular training, Orthotic/Fit training, DME instructions, Manual therapy, and Re-evaluation  PLAN FOR NEXT SESSION: Assess , , and ABC scale - set goals as appropriate.  Establish a functional strength and static balance HEP.  Gait training w/ SPC - obstacle management and hurdles.  Postural stability.  Sadie Haber, PT, DPT 02/21/2023, 4:55 PM

## 2023-03-03 ENCOUNTER — Encounter: Payer: Self-pay | Admitting: Physical Therapy

## 2023-03-03 ENCOUNTER — Ambulatory Visit: Payer: Medicare Other | Admitting: Physical Therapy

## 2023-03-03 VITALS — BP 184/68 | HR 69

## 2023-03-03 DIAGNOSIS — R2681 Unsteadiness on feet: Secondary | ICD-10-CM | POA: Diagnosis not present

## 2023-03-03 DIAGNOSIS — R269 Unspecified abnormalities of gait and mobility: Secondary | ICD-10-CM | POA: Diagnosis not present

## 2023-03-03 DIAGNOSIS — M6281 Muscle weakness (generalized): Secondary | ICD-10-CM

## 2023-03-03 DIAGNOSIS — R2689 Other abnormalities of gait and mobility: Secondary | ICD-10-CM

## 2023-03-03 DIAGNOSIS — G459 Transient cerebral ischemic attack, unspecified: Secondary | ICD-10-CM | POA: Diagnosis not present

## 2023-03-03 NOTE — Patient Instructions (Signed)
Access Code: [email or web address] URL: [email or web address] Date: 03/03/2023 Prepared by: Camille Bal  Exercises - Sit to Stand with Armchair  - 1 x daily - 7 x weekly - 2-3 sets - 10 reps

## 2023-03-03 NOTE — Therapy (Signed)
OUTPATIENT PHYSICAL THERAPY NEURO EVALUATION   Patient Name: Sophia Suarez MRN: [id number] DOB:1940-10-28, 82 y.o., female Today's Date: 03/03/2023   PCP: Mila Palmer, MD REFERRING PROVIDER: Levert Feinstein, MD  END OF SESSION:  PT End of Session - 03/03/23 1503     Visit Number 2    Number of Visits 9   8 + eval   Date for PT Re-Evaluation 05/05/23   pushed out due to scheduling delay   Authorization Type [employer] MEDICARE    Progress Note Due on Visit 10    PT Start Time 1450    PT Stop Time 1534    PT Time Calculation (min) 44 min    Equipment Utilized During Treatment Gait belt    Activity Tolerance Patient tolerated treatment well    Behavior During Therapy WFL for tasks assessed/performed             Past Medical History:  Diagnosis Date   Allergic rhinitis, seasonal    Anemia    Arthritis    Asthma    Carotid artery occlusion    Chronic kidney disease    Chronic pain    Complication of anesthesia    makes her hyper   Diabetes mellitus    no medication  diet controlled   Diverticulitis    Dyspnea    with exertion   GERD (gastroesophageal reflux disease)    Heart murmur    Hyperlipidemia    Hypertension    Lumbago    Panic attacks    Stroke [facility])    Urinary incontinence    Past Surgical History:  Procedure Laterality Date   ABDOMINAL HYSTERECTOMY  1973   Partial   APPENDECTOMY  1958   BIOPSY THYROID  Dec 05, 2013   Tumor- Benign   COLECTOMY  2003   extent uncertain   EYE SURGERY Right October 10, 2014   Cataract   EYE SURGERY Left  October 17, 2014   Cataract   KNEE ARTHROSCOPY Left    MASS EXCISION Right 04/23/2018   Procedure: EXCISION OF SKIN MASS WITH PRIMARY CLOSURE;  Surgeon: Serena Colonel, MD;  Location: [facility];  Service: ENT;  Laterality: Right;   OVARIAN CYST SURGERY     PAROTIDECTOMY Right 04/23/2018   Procedure: Superficial Parotidectomy with nerve dissection;  Surgeon: Serena Colonel, MD;  Location: [facility];  Service: ENT;  Laterality: Right;   PARTIAL HYSTERECTOMY     TOTAL KNEE ARTHROPLASTY Left 09/15/2017   Procedure: TOTAL KNEE ARTHROPLASTY;  Surgeon: Gean Birchwood, MD;  Location: [facility];  Service: Orthopedics;  Laterality: Left;   Patient Active Problem List   Diagnosis Date Noted   TIA (transient ischemic attack) 01/26/2023   Gait abnormality 01/26/2023   Age related osteoporosis 08/13/2021   Decreased estrogen level 08/13/2021   Recurrent major depression (HCC) 08/13/2021   Senile purpura (HCC) 08/13/2021   Acute on chronic renal failure (HCC) 10/13/2020   Aortic valve disorder 10/13/2020   Atrophy of vagina 10/13/2020   Body mass index (BMI) 32.0-32.9, adult 10/13/2020   Chronic kidney disease, stage 3 unspecified (HCC) 10/13/2020   Diabetic renal disease (HCC) 10/13/2020   Constipation 10/13/2020   Disorder of arteries and arterioles, unspecified (HCC) 10/13/2020   Dysphagia 10/13/2020   Dyssomnia 10/13/2020   Essential tremor 10/13/2020   Flatulence 10/13/2020   History of hysterectomy 10/13/2020   Lichen sclerosus 10/13/2020   Major depression in partial remission (HCC) 10/13/2020   Mild intermittent asthma  10/13/2020   Mild recurrent major depression (HCC) 10/13/2020   Morbid obesity (HCC) 10/13/2020   Nausea and vomiting 10/13/2020   Nontoxic single thyroid nodule 10/13/2020   Overactive bladder 10/13/2020   Primary open angle glaucoma (POAG) of both eyes, mild stage 10/13/2020   Stress incontinence (female) (female) 10/13/2020   Vitamin D deficiency 10/13/2020   Mixed incontinence 07/29/2020   Rhinorrhea 02/04/2020   Infection of skin of neck 01/02/2020   Parotid mass 04/23/2018   Parotid abscess 02/21/2018   Primary osteoarthritis of left knee 09/15/2017   Degenerative arthritis of left knee 09/12/2017   Preop cardiovascular exam 07/23/2013   Weight loss, non-intentional 07/03/2013   Aortic stenosis 05/21/2013   Occlusion and stenosis of carotid artery  without mention of cerebral infarction 06/15/2012   Hypertension    Hyperlipidemia    GERD (gastroesophageal reflux disease)    Diverticulitis    Chronic pain    Lumbago    Heart murmur    Panic attacks    Stroke [facility])    Allergic rhinitis, seasonal    Carotid artery stenosis, right 11/18/2011    ONSET DATE: 01/16/2023 at 3am  REFERRING DIAG: G45.9 (ICD-10-CM) - TIA (transient ischemic attack) R26.9 (ICD-10-CM) - Gait abnormality  THERAPY DIAG:  Other abnormalities of gait and mobility  Muscle weakness (generalized)  Unsteadiness on feet  Rationale for Evaluation and Treatment: Rehabilitation  SUBJECTIVE:                                                                                                                                                                                             SUBJECTIVE STATEMENT: Patient states she has not slept much the past few days.  She states her urinary incontinence has become worse, but MD is aware and she is wearing a pad 24/7. Pt accompanied by: self - she drives herself  PERTINENT HISTORY: Scoliosis, L TKA, peripheral neuropathy, HLD, HTN, Bilateral Carotid stenosis (R worse), GERD, aortic stenosis, CKD stage 3, osteoporosis, depression, DM2 (diet controlled per patient)  Patient had sudden onset of left sided paresthesia and weakness with notable facial droop in early morning hours of January 16, 2023.  She did not seek emergency attention, but scheduled a doctor's appt to follow-up.  PAIN:  Are you having pain? Yes: NPRS scale: 8/10 Pain location: low back, 4/10 in neck Pain description: deep pain Aggravating factors: standing Relieving factors: sitting or laying down  PRECAUTIONS: Fall; pt wears glasses to drive and read, hears best on left side; bilateral carotid stenosis (R worse than L)  RED FLAGS: None   WEIGHT BEARING RESTRICTIONS: No  FALLS: Has  patient fallen in last 6 months? No - several near misses as she does not use  the cane in her house due to clutter/furniture  LIVING ENVIRONMENT: Lives with: lives alone Lives in: House/apartment Stairs: Yes: Internal: 15 steps; can reach both and into basement area where washer/dryer are and External: 2 steps; on right going up Has following equipment at home: Single point cane, Walker - 2 wheeled, Tour manager, and Grab bars  PLOF: Independent and Independent with community mobility with device - getting off low surfaces like couch are hard  PATIENT GOALS: "To maintain my independence!"  OBJECTIVE:   DIAGNOSTIC FINDINGS:  Brain MRI 02/18/2023 IMPRESSION: MRI scan of the brain without contrast showing mild age-related changes of chronic small vessel disease.  No acute abnormalities noted.  None from time of TIA accessible to PT.  COGNITION: Overall cognitive status: Within functional limits for tasks assessed   SENSATION: Light touch: WFL and more tingling on LLE  COORDINATION: LE RAMS:  WNL BLE Heel-to-shin:  WNL  EDEMA:  Baseline ankle edema bilaterally, not wearing compression stockings, medically managed  MUSCLE TONE: None noted in BLE  POSTURE: rounded shoulders, forward head, increased thoracic kyphosis, and left weight shift from left scoliosis  LOWER EXTREMITY ROM:     Active  Right Eval Left Eval  Hip flexion Grossly WNL  Hip extension   Hip abduction   Hip adduction   Hip internal rotation   Hip external rotation   Knee flexion   Knee extension   Ankle dorsiflexion   Ankle plantarflexion    Ankle inversion    Ankle eversion     (Blank rows = not tested)  LOWER EXTREMITY MMT:    MMT Right Eval Left Eval  Hip flexion 3+/5 3/5  Hip extension    Hip abduction    Hip adduction    Hip internal rotation    Hip external rotation    Knee flexion    Knee extension 4-/5 3+/5  Ankle dorsiflexion 4/5 4/5  Ankle plantarflexion    Ankle inversion    Ankle eversion    (Blank rows = not tested)  BED MOBILITY:  Sit to supine  Complete Independence Supine to sit Complete Independence Rolling to Right Complete Independence Rolling to Left Complete Independence  TRANSFERS: Assistive device utilized: Single point cane  Sit to stand: Modified independence Stand to sit: Modified independence Chair to chair: SBA  GAIT: Gait pattern: decreased arm swing- Left, decreased step length- Left, decreased stance time- Left, decreased stride length, knee flexed in stance- Left, trunk flexed, and narrow BOS Distance walked: various clinic distances Assistive device utilized: Single point cane Level of assistance: SBA  FUNCTIONAL TESTS:  5 times sit to stand: 12.22 seconds BUE support Timed up and go (TUG): 16.07 seconds w/ SPC SBA 6 minute walk test: To be assessed. 10 meter walk test: To be assessed.  PATIENT SURVEYS:  ABC scale To be assessed.  TODAY'S TREATMENT:  DATE: 03/03/2023 RUE in sitting prior to session Vitals:   03/03/23 1458 03/03/23 1501  BP: (!) 191/69 (!) 184/68  Pulse: 72 69   - w/ SPC and SBA:  365 feet w/ 2 seated rest breaks - w/ SPC SBA:  19.38 sec w/ SPC = 0.52 m/sec OR 1.70 ft/sec -ABC scale:  71.25%  Initiated HEP: Access Code: [email or web address] URL: [email or web address] Date: 03/03/2023 Prepared by: Camille Bal  Exercises - Sit to Stand with Armchair  - 1 x daily - 7 x weekly - 2-3 sets - 10 reps  PATIENT EDUCATION: Education details: Outcome interpretations.  Initial HEP. Person educated: Patient Education method: Explanation Education comprehension: verbalized understanding  HOME EXERCISE PROGRAM: Access Code: [email or web address] URL: [email or web address] Date: 03/03/2023 Prepared by: Camille Bal  Exercises - Sit to Stand with Armchair  - 1 x daily - 7 x weekly - 2-3 sets - 10 reps  GOALS: Goals reviewed with  patient? Yes  SHORT TERM GOALS: Target date: 03/24/2023  Pt will be independent and compliant with initial strength and balance HEP in order to maintain functional progress and improve mobility. Baseline: To be established. Goal status: INITIAL  2.  Pt will decrease 5xSTS to </=12 seconds w/o UE support in order to demonstrate decreased risk for falls and improved functional bilateral LE strength and power. Baseline: 12.22 sec w/ BUE support Goal status: INITIAL  3.  Pt will demonstrate TUG of </=13 seconds in order to decrease risk of falls and improve functional mobility using LRAD. Baseline: 16.07 seconds SBA w/ SPC Goal status: INITIAL  4.  Pt will demonstrate a gait speed of >/=1.90 feet/sec in order to decrease risk for falls. Baseline: 1.70 ft/sec (8/23) Goal status: INITIAL  LONG TERM GOALS: Target date: 04/21/2023  Pt will be independent and compliant with advanced strength and balance HEP in order to maintain functional progress and improve mobility. Baseline: To be established. Goal status: INITIAL  2.  Pt will ambulate >/=565 feet on to demonstrate improved functional endurance for home and community participation. Baseline:  365 feet w/ SPC and SBA and 2 seated rest breaks (8/23) Goal status: INITIAL  3.  Pt will demonstrate a gait speed of >/=2.10 feet/sec in order to decrease risk for falls. Baseline: 1.70 ft/sec (8/23) Goal status: INITIAL  4.  Patient will improve ABC scale score to >/=80% to demonstrate decreased fear of falling and improved confidence in dynamic stability. Baseline: 71.25% Goal status: INITIAL  ASSESSMENT:  CLINICAL IMPRESSION: Assessed this visit with patient demonstrating decreased endurance at 365 feet using 2 seated rest breaks.  Her ABC scale score of 71.25% demonstrates moderate fear of falling.  She ambulates at a speed of 1.70 ft/sec using SPC indicating increased fall risk dynamically.  Initiated HEP for functional  strength with plan to expand on deficits in next session.  OBJECTIVE IMPAIRMENTS: Abnormal gait, decreased activity tolerance, decreased balance, decreased endurance, decreased knowledge of condition, difficulty walking, decreased strength, increased edema, impaired flexibility, impaired sensation, improper body mechanics, postural dysfunction, and pain.   ACTIVITY LIMITATIONS: carrying, lifting, bending, standing, squatting, stairs, transfers, reach over head, and locomotion level  PARTICIPATION LIMITATIONS: meal prep, cleaning, laundry, shopping, community activity, and yard work  PERSONAL FACTORS: Age, Fitness, Past/current experiences, and 3+ comorbidities: left TKA, HTN, DM2/peripheral neuropathy  are also affecting patient's functional outcome.   REHAB POTENTIAL: Good  CLINICAL DECISION MAKING: Evolving/moderate complexity  EVALUATION COMPLEXITY: Moderate  PLAN:  PT FREQUENCY: 1x/week  PT DURATION: 8 weeks  PLANNED  INTERVENTIONS: Therapeutic exercises, Therapeutic activity, Neuromuscular re-education, Balance training, Gait training, Patient/Family education, Self Care, Stair training, Vestibular training, Orthotic/Fit training, DME instructions, Manual therapy, and Re-evaluation  PLAN FOR NEXT SESSION: Add to functional strength and static balance HEP.  Gait training w/ SPC - obstacle management and hurdles.  Postural stability.  Stair management.  Sadie Haber, PT, DPT 03/03/2023, 3:36 PM

## 2023-03-07 ENCOUNTER — Ambulatory Visit: Payer: Medicare Other | Admitting: Physical Therapy

## 2023-03-15 ENCOUNTER — Encounter: Payer: Self-pay | Admitting: Physical Therapy

## 2023-03-15 ENCOUNTER — Ambulatory Visit: Payer: Medicare Other | Attending: Neurology | Admitting: Physical Therapy

## 2023-03-15 VITALS — BP 191/61 | HR 62

## 2023-03-15 DIAGNOSIS — M6281 Muscle weakness (generalized): Secondary | ICD-10-CM | POA: Diagnosis not present

## 2023-03-15 DIAGNOSIS — R2681 Unsteadiness on feet: Secondary | ICD-10-CM | POA: Diagnosis not present

## 2023-03-15 DIAGNOSIS — R2689 Other abnormalities of gait and mobility: Secondary | ICD-10-CM | POA: Insufficient documentation

## 2023-03-15 NOTE — Therapy (Signed)
OUTPATIENT PHYSICAL THERAPY NEURO TREATMENT   Patient Name: Sophia Suarez MRN: [id number] DOB:11-22-1940, 82 y.o., female Today's Date: 03/15/2023   PCP: Mila Palmer, MD REFERRING PROVIDER: Levert Feinstein, MD  END OF SESSION:  PT End of Session - 03/15/23 1540     Visit Number 3    Number of Visits 9   8 + eval   Date for PT Re-Evaluation 05/05/23   pushed out due to scheduling delay   Authorization Type [employer] MEDICARE    Progress Note Due on Visit 10    PT Start Time 1534    PT Stop Time 1612    PT Time Calculation (min) 38 min    Equipment Utilized During Treatment Gait belt    Activity Tolerance Patient tolerated treatment well;Patient limited by fatigue;Treatment limited secondary to medical complications (Comment);Other (comment)   elevated systolic BP   Behavior During Therapy WFL for tasks assessed/performed             Past Medical History:  Diagnosis Date   Allergic rhinitis, seasonal    Anemia    Arthritis    Asthma    Carotid artery occlusion    Chronic kidney disease    Chronic pain    Complication of anesthesia    makes her hyper   Diabetes mellitus    no medication  diet controlled   Diverticulitis    Dyspnea    with exertion   GERD (gastroesophageal reflux disease)    Heart murmur    Hyperlipidemia    Hypertension    Lumbago    Panic attacks    Stroke [facility])    Urinary incontinence    Past Surgical History:  Procedure Laterality Date   ABDOMINAL HYSTERECTOMY  1973   Partial   APPENDECTOMY  1958   BIOPSY THYROID  Dec 05, 2013   Tumor- Benign   COLECTOMY  2003   extent uncertain   EYE SURGERY Right October 10, 2014   Cataract   EYE SURGERY Left  October 17, 2014   Cataract   KNEE ARTHROSCOPY Left    MASS EXCISION Right 04/23/2018   Procedure: EXCISION OF SKIN MASS WITH PRIMARY CLOSURE;  Surgeon: Serena Colonel, MD;  Location: [facility];  Service: ENT;  Laterality: Right;   OVARIAN CYST SURGERY     PAROTIDECTOMY Right  04/23/2018   Procedure: Superficial Parotidectomy with nerve dissection;  Surgeon: Serena Colonel, MD;  Location: [facility];  Service: ENT;  Laterality: Right;   PARTIAL HYSTERECTOMY     TOTAL KNEE ARTHROPLASTY Left 09/15/2017   Procedure: TOTAL KNEE ARTHROPLASTY;  Surgeon: Gean Birchwood, MD;  Location: [facility] OR;  Service: Orthopedics;  Laterality: Left;   Patient Active Problem List   Diagnosis Date Noted   TIA (transient ischemic attack) 01/26/2023   Gait abnormality 01/26/2023   Age related osteoporosis 08/13/2021   Decreased estrogen level 08/13/2021   Recurrent major depression (HCC) 08/13/2021   Senile purpura (HCC) 08/13/2021   Acute on chronic renal failure (HCC) 10/13/2020   Aortic valve disorder 10/13/2020   Atrophy of vagina 10/13/2020   Body mass index (BMI) 32.0-32.9, adult 10/13/2020   Chronic kidney disease, stage 3 unspecified (HCC) 10/13/2020   Diabetic renal disease (HCC) 10/13/2020   Constipation 10/13/2020   Disorder of arteries and arterioles, unspecified (HCC) 10/13/2020   Dysphagia 10/13/2020   Dyssomnia 10/13/2020   Essential tremor 10/13/2020   Flatulence 10/13/2020   History of hysterectomy 10/13/2020   Lichen sclerosus 10/13/2020  Major depression in partial remission (HCC) 10/13/2020   Mild intermittent asthma 10/13/2020   Mild recurrent major depression (HCC) 10/13/2020   Morbid obesity (HCC) 10/13/2020   Nausea and vomiting 10/13/2020   Nontoxic single thyroid nodule 10/13/2020   Overactive bladder 10/13/2020   Primary open angle glaucoma (POAG) of both eyes, mild stage 10/13/2020   Stress incontinence (female) (female) 10/13/2020   Vitamin D deficiency 10/13/2020   Mixed incontinence 07/29/2020   Rhinorrhea 02/04/2020   Infection of skin of neck 01/02/2020   Parotid mass 04/23/2018   Parotid abscess 02/21/2018   Primary osteoarthritis of left knee 09/15/2017   Degenerative arthritis of left knee 09/12/2017   Preop cardiovascular exam  07/23/2013   Weight loss, non-intentional 07/03/2013   Aortic stenosis 05/21/2013   Occlusion and stenosis of carotid artery without mention of cerebral infarction 06/15/2012   Hypertension    Hyperlipidemia    GERD (gastroesophageal reflux disease)    Diverticulitis    Chronic pain    Lumbago    Heart murmur    Panic attacks    Stroke [facility])    Allergic rhinitis, seasonal    Carotid artery stenosis, right 11/18/2011    ONSET DATE: 01/16/2023 at 3am  REFERRING DIAG: G45.9 (ICD-10-CM) - TIA (transient ischemic attack) R26.9 (ICD-10-CM) - Gait abnormality  THERAPY DIAG:  Other abnormalities of gait and mobility  Muscle weakness (generalized)  Unsteadiness on feet  Rationale for Evaluation and Treatment: Rehabilitation  SUBJECTIVE:                                                                                                                                                                                             SUBJECTIVE STATEMENT: Patient has recently recovered from viral illness and reports she is doing okay, but has had a lot of dizziness that she suspects is from dehydration as her illness gave her diarrhea and vomiting.  She has not done much of her HEP because of this. Pt accompanied by: self - she drives herself  PERTINENT HISTORY: Scoliosis, L TKA, peripheral neuropathy, HLD, HTN, Bilateral Carotid stenosis (R worse), GERD, aortic stenosis, CKD stage 3, osteoporosis, depression, DM2 (diet controlled per patient)  Patient had sudden onset of left sided paresthesia and weakness with notable facial droop in early morning hours of January 16, 2023.  She did not seek emergency attention, but scheduled a doctor's appt to follow-up.  PAIN:  Are you having pain? Yes: NPRS scale: 9/10 Pain location: low back Pain description: deep pain Aggravating factors: standing Relieving factors: sitting or laying down  PRECAUTIONS: Fall; pt wears glasses to drive and read, hears  best on  left side; bilateral carotid stenosis (R worse than L)  RED FLAGS: None   WEIGHT BEARING RESTRICTIONS: No  FALLS: Has patient fallen in last 6 months? No - several near misses as she does not use the cane in her house due to clutter/furniture  LIVING ENVIRONMENT: Lives with: lives alone Lives in: House/apartment Stairs: Yes: Internal: 15 steps; can reach both and into basement area where washer/dryer are and External: 2 steps; on right going up Has following equipment at home: Single point cane, Walker - 2 wheeled, Tour manager, and Grab bars  PLOF: Independent and Independent with community mobility with device - getting off low surfaces like couch are hard  PATIENT GOALS: "To maintain my independence!"  OBJECTIVE:   DIAGNOSTIC FINDINGS:  Brain MRI 02/18/2023 IMPRESSION: MRI scan of the brain without contrast showing mild age-related changes of chronic small vessel disease.  No acute abnormalities noted.  None from time of TIA accessible to PT.  COGNITION: Overall cognitive status: Within functional limits for tasks assessed   SENSATION: Light touch: WFL and more tingling on LLE  COORDINATION: LE RAMS:  WNL BLE Heel-to-shin:  WNL  EDEMA:  Baseline ankle edema bilaterally, not wearing compression stockings, medically managed  MUSCLE TONE: None noted in BLE  POSTURE: rounded shoulders, forward head, increased thoracic kyphosis, and left weight shift from left scoliosis  LOWER EXTREMITY ROM:     Active  Right Eval Left Eval  Hip flexion Grossly WNL  Hip extension   Hip abduction   Hip adduction   Hip internal rotation   Hip external rotation   Knee flexion   Knee extension   Ankle dorsiflexion   Ankle plantarflexion    Ankle inversion    Ankle eversion     (Blank rows = not tested)  LOWER EXTREMITY MMT:    MMT Right Eval Left Eval  Hip flexion 3+/5 3/5  Hip extension    Hip abduction    Hip adduction    Hip internal rotation    Hip external  rotation    Knee flexion    Knee extension 4-/5 3+/5  Ankle dorsiflexion 4/5 4/5  Ankle plantarflexion    Ankle inversion    Ankle eversion    (Blank rows = not tested)  BED MOBILITY:  Sit to supine Complete Independence Supine to sit Complete Independence Rolling to Right Complete Independence Rolling to Left Complete Independence  TRANSFERS: Assistive device utilized: Single point cane  Sit to stand: Modified independence Stand to sit: Modified independence Chair to chair: SBA  GAIT: Gait pattern: decreased arm swing- Left, decreased step length- Left, decreased stance time- Left, decreased stride length, knee flexed in stance- Left, trunk flexed, and narrow BOS Distance walked: various clinic distances Assistive device utilized: Single point cane Level of assistance: SBA  FUNCTIONAL TESTS:  5 times sit to stand: 12.22 seconds BUE support Timed up and go (TUG): 16.07 seconds w/ SPC SBA 6 minute walk test: To be assessed. 10 meter walk test: To be assessed.  PATIENT SURVEYS:  ABC scale To be assessed.  TODAY'S TREATMENT:  DATE: 03/15/2023 RUE in sitting prior to session (first reading in normal cuff, second in large cuff) Vitals:   03/15/23 1544 03/15/23 1546  BP: (!) 188/76 (!) 191/61  Pulse: 63 62   Pt needs increased time to transition into supine due to dizziness, no visible nystagmus and pt not on meclizine.  Reports a slow turning feeling, states she only has dizziness when laying back or standing up. -Supine bridges 2x10 -Seated march 2x20 alt LE -Seated hamstring curls w/ red resistance band 2x15 -Seated LAQ w/ red resistance band 2x10  PT ambulates patient lobby<>mat table due to notable shaking in BLE and dyspnea with ambulation today.  PATIENT EDUCATION: Education details: Additions to HEP.   Person educated: Patient Education  method: Explanation Education comprehension: verbalized understanding  HOME EXERCISE PROGRAM: Access Code: [email or web address] URL: [email or web address] Date: 03/15/2023 Prepared by: Camille Bal  Exercises - Sit to Stand with Armchair  - 1 x daily - 7 x weekly - 2-3 sets - 10 reps - Supine Bridge  - 1 x daily - 4 x weekly - 1-2 sets - 10 reps - Seated March  - 1 x daily - 4-5 x weekly - 2 sets - 20 reps - Seated Hamstring Curls with Resistance  - 1 x daily - 4-5 x weekly - 2 sets - 15 reps - Seated Knee Extension with Resistance  - 1 x daily - 4-5 x weekly - 2 sets - 10 reps  GOALS: Goals reviewed with patient? Yes  SHORT TERM GOALS: Target date: 03/24/2023  Pt will be independent and compliant with initial strength and balance HEP in order to maintain functional progress and improve mobility. Baseline: To be established. Goal status: INITIAL  2.  Pt will decrease 5xSTS to </=12 seconds w/o UE support in order to demonstrate decreased risk for falls and improved functional bilateral LE strength and power. Baseline: 12.22 sec w/ BUE support Goal status: INITIAL  3.  Pt will demonstrate TUG of </=13 seconds in order to decrease risk of falls and improve functional mobility using LRAD. Baseline: 16.07 seconds SBA w/ SPC Goal status: INITIAL  4.  Pt will demonstrate a gait speed of >/=1.90 feet/sec in order to decrease risk for falls. Baseline: 1.70 ft/sec (8/23) Goal status: INITIAL  LONG TERM GOALS: Target date: 04/21/2023  Pt will be independent and compliant with advanced strength and balance HEP in order to maintain functional progress and improve mobility. Baseline: To be established. Goal status: INITIAL  2.  Pt will ambulate >/=565 feet on to demonstrate improved functional endurance for home and community participation. Baseline:  365 feet w/ SPC and SBA and 2 seated rest breaks (8/23) Goal status: INITIAL  3.  Pt will demonstrate a gait speed of  >/=2.10 feet/sec in order to decrease risk for falls. Baseline: 1.70 ft/sec (8/23) Goal status: INITIAL  4.  Patient will improve ABC scale score to >/=80% to demonstrate decreased fear of falling and improved confidence in dynamic stability. Baseline: 71.25% Goal status: INITIAL  ASSESSMENT:  CLINICAL IMPRESSION: Session somewhat limited by patient's recent medical illness with lingering fatigue and dizziness as well as elevated systolic BP readings today.  Performed seated therapeutic exercise to make additions to HEP targeting LE strength.  She tolerates low level activity well today and without known issue.  Will continue per POC if patient is feeling better at next session.  OBJECTIVE IMPAIRMENTS: Abnormal gait, decreased activity tolerance, decreased balance, decreased endurance, decreased knowledge of condition, difficulty walking, decreased strength,  increased edema, impaired flexibility, impaired sensation, improper body mechanics, postural dysfunction, and pain.   ACTIVITY LIMITATIONS: carrying, lifting, bending, standing, squatting, stairs, transfers, reach over head, and locomotion level  PARTICIPATION LIMITATIONS: meal prep, cleaning, laundry, shopping, community activity, and yard work  PERSONAL FACTORS: Age, Fitness, Past/current experiences, and 3+ comorbidities: left TKA, HTN, DM2/peripheral neuropathy  are also affecting patient's functional outcome.   REHAB POTENTIAL: Good  CLINICAL DECISION MAKING: Evolving/moderate complexity  EVALUATION COMPLEXITY: Moderate  PLAN:  PT FREQUENCY: 1x/week  PT DURATION: 8 weeks  PLANNED INTERVENTIONS: Therapeutic exercises, Therapeutic activity, Neuromuscular re-education, Balance training, Gait training, Patient/Family education, Self Care, Stair training, Vestibular training, Orthotic/Fit training, DME instructions, Manual therapy, and Re-evaluation  PLAN FOR NEXT SESSION: ASSESS STGs!  Is she feeling better?  Add to functional  strength and static balance HEP.  Gait training w/ SPC - obstacle management and hurdles.  Postural stability.  Stair management.  Sadie Haber, PT, DPT 03/15/2023, 4:17 PM

## 2023-03-15 NOTE — Patient Instructions (Signed)
Access Code: [email or web address] URL: [email or web address] Date: 03/15/2023 Prepared by: Camille Bal  Exercises - Sit to Stand with Armchair  - 1 x daily - 7 x weekly - 2-3 sets - 10 reps - Supine Bridge  - 1 x daily - 4 x weekly - 1-2 sets - 10 reps - Seated March  - 1 x daily - 4-5 x weekly - 2 sets - 20 reps - Seated Hamstring Curls with Resistance  - 1 x daily - 4-5 x weekly - 2 sets - 15 reps - Seated Knee Extension with Resistance  - 1 x daily - 4-5 x weekly - 2 sets - 10 reps

## 2023-03-16 DIAGNOSIS — H401131 Primary open-angle glaucoma, bilateral, mild stage: Secondary | ICD-10-CM | POA: Diagnosis not present

## 2023-03-21 ENCOUNTER — Ambulatory Visit: Payer: Medicare Other | Admitting: Physical Therapy

## 2023-03-21 ENCOUNTER — Encounter: Payer: Self-pay | Admitting: Physical Therapy

## 2023-03-21 VITALS — BP 184/64 | HR 66

## 2023-03-21 DIAGNOSIS — M6281 Muscle weakness (generalized): Secondary | ICD-10-CM

## 2023-03-21 DIAGNOSIS — R2689 Other abnormalities of gait and mobility: Secondary | ICD-10-CM

## 2023-03-21 DIAGNOSIS — R2681 Unsteadiness on feet: Secondary | ICD-10-CM | POA: Diagnosis not present

## 2023-03-21 NOTE — Therapy (Signed)
OUTPATIENT PHYSICAL THERAPY NEURO TREATMENT   Patient Name: Sophia Suarez MRN: [id number] DOB:04-02-1941, 82 y.o., female Today's Date: 03/21/2023   PCP: Mila Palmer, MD REFERRING PROVIDER: Levert Feinstein, MD  END OF SESSION:  PT End of Session - 03/21/23 1455     Visit Number 4    Number of Visits 9   8 + eval   Date for PT Re-Evaluation 05/05/23   pushed out due to scheduling delay   Authorization Type [employer] MEDICARE    Progress Note Due on Visit 10    PT Start Time 1446    PT Stop Time 1530    PT Time Calculation (min) 44 min    Equipment Utilized During Treatment Gait belt    Activity Tolerance Patient tolerated treatment well;Patient limited by pain    Behavior During Therapy WFL for tasks assessed/performed             Past Medical History:  Diagnosis Date   Allergic rhinitis, seasonal    Anemia    Arthritis    Asthma    Carotid artery occlusion    Chronic kidney disease    Chronic pain    Complication of anesthesia    makes her hyper   Diabetes mellitus    no medication  diet controlled   Diverticulitis    Dyspnea    with exertion   GERD (gastroesophageal reflux disease)    Heart murmur    Hyperlipidemia    Hypertension    Lumbago    Panic attacks    Stroke [facility])    Urinary incontinence    Past Surgical History:  Procedure Laterality Date   ABDOMINAL HYSTERECTOMY  1973   Partial   APPENDECTOMY  1958   BIOPSY THYROID  Dec 05, 2013   Tumor- Benign   COLECTOMY  2003   extent uncertain   EYE SURGERY Right October 10, 2014   Cataract   EYE SURGERY Left  October 17, 2014   Cataract   KNEE ARTHROSCOPY Left    MASS EXCISION Right 04/23/2018   Procedure: EXCISION OF SKIN MASS WITH PRIMARY CLOSURE;  Surgeon: Serena Colonel, MD;  Location: [facility];  Service: ENT;  Laterality: Right;   OVARIAN CYST SURGERY     PAROTIDECTOMY Right 04/23/2018   Procedure: Superficial Parotidectomy with nerve dissection;  Surgeon: Serena Colonel, MD;   Location:  SURGERY CENTER;  Service: ENT;  Laterality: Right;   PARTIAL HYSTERECTOMY     TOTAL KNEE ARTHROPLASTY Left 09/15/2017   Procedure: TOTAL KNEE ARTHROPLASTY;  Surgeon: Gean Birchwood, MD;  Location: [facility] OR;  Service: Orthopedics;  Laterality: Left;   Patient Active Problem List   Diagnosis Date Noted   TIA (transient ischemic attack) 01/26/2023   Gait abnormality 01/26/2023   Age related osteoporosis 08/13/2021   Decreased estrogen level 08/13/2021   Recurrent major depression (HCC) 08/13/2021   Senile purpura (HCC) 08/13/2021   Acute on chronic renal failure (HCC) 10/13/2020   Aortic valve disorder 10/13/2020   Atrophy of vagina 10/13/2020   Body mass index (BMI) 32.0-32.9, adult 10/13/2020   Chronic kidney disease, stage 3 unspecified (HCC) 10/13/2020   Diabetic renal disease (HCC) 10/13/2020   Constipation 10/13/2020   Disorder of arteries and arterioles, unspecified (HCC) 10/13/2020   Dysphagia 10/13/2020   Dyssomnia 10/13/2020   Essential tremor 10/13/2020   Flatulence 10/13/2020   History of hysterectomy 10/13/2020   Lichen sclerosus 10/13/2020   Major depression in partial remission (HCC) 10/13/2020  Mild intermittent asthma 10/13/2020   Mild recurrent major depression (HCC) 10/13/2020   Morbid obesity (HCC) 10/13/2020   Nausea and vomiting 10/13/2020   Nontoxic single thyroid nodule 10/13/2020   Overactive bladder 10/13/2020   Primary open angle glaucoma (POAG) of both eyes, mild stage 10/13/2020   Stress incontinence (female) (female) 10/13/2020   Vitamin D deficiency 10/13/2020   Mixed incontinence 07/29/2020   Rhinorrhea 02/04/2020   Infection of skin of neck 01/02/2020   Parotid mass 04/23/2018   Parotid abscess 02/21/2018   Primary osteoarthritis of left knee 09/15/2017   Degenerative arthritis of left knee 09/12/2017   Preop cardiovascular exam 07/23/2013   Weight loss, non-intentional 07/03/2013   Aortic stenosis 05/21/2013   Occlusion and  stenosis of carotid artery without mention of cerebral infarction 06/15/2012   Hypertension    Hyperlipidemia    GERD (gastroesophageal reflux disease)    Diverticulitis    Chronic pain    Lumbago    Heart murmur    Panic attacks    Stroke [facility])    Allergic rhinitis, seasonal    Carotid artery stenosis, right 11/18/2011    ONSET DATE: 01/16/2023 at 3am  REFERRING DIAG: G45.9 (ICD-10-CM) - TIA (transient ischemic attack) R26.9 (ICD-10-CM) - Gait abnormality  THERAPY DIAG:  Other abnormalities of gait and mobility  Muscle weakness (generalized)  Unsteadiness on feet  Rationale for Evaluation and Treatment: Rehabilitation  SUBJECTIVE:                                                                                                                                                                                             SUBJECTIVE STATEMENT: Patient feels she is doing better from recent illness.  She has been doing her HEP and it has made her sore. Pt accompanied by: self - she drives herself  PERTINENT HISTORY: Scoliosis, L TKA, peripheral neuropathy, HLD, HTN, Bilateral Carotid stenosis (R worse), GERD, aortic stenosis, CKD stage 3, osteoporosis, depression, DM2 (diet controlled per patient)  Patient had sudden onset of left sided paresthesia and weakness with notable facial droop in early morning hours of January 16, 2023.  She did not seek emergency attention, but scheduled a doctor's appt to follow-up.  PAIN:  Are you having pain? Yes: NPRS scale: 6/10 Pain location: low back Pain description: deep pain Aggravating factors: standing Relieving factors: sitting or laying down  PRECAUTIONS: Fall; pt wears glasses to drive and read, hears best on left side; bilateral carotid stenosis (R worse than L)  RED FLAGS: None   WEIGHT BEARING RESTRICTIONS: No  FALLS: Has patient fallen in last 6 months? No - several  near misses as she does not use the cane in her house due to  clutter/furniture  LIVING ENVIRONMENT: Lives with: lives alone Lives in: House/apartment Stairs: Yes: Internal: 15 steps; can reach both and into basement area where washer/dryer are and External: 2 steps; on right going up Has following equipment at home: Single point cane, Walker - 2 wheeled, Tour manager, and Grab bars  PLOF: Independent and Independent with community mobility with device - getting off low surfaces like couch are hard  PATIENT GOALS: "To maintain my independence!"  OBJECTIVE:   DIAGNOSTIC FINDINGS:  Brain MRI 02/18/2023 IMPRESSION: MRI scan of the brain without contrast showing mild age-related changes of chronic small vessel disease.  No acute abnormalities noted.  None from time of TIA accessible to PT.  COGNITION: Overall cognitive status: Within functional limits for tasks assessed   SENSATION: Light touch: WFL and more tingling on LLE  COORDINATION: LE RAMS:  WNL BLE Heel-to-shin:  WNL  EDEMA:  Baseline ankle edema bilaterally, not wearing compression stockings, medically managed  MUSCLE TONE: None noted in BLE  POSTURE: rounded shoulders, forward head, increased thoracic kyphosis, and left weight shift from left scoliosis  LOWER EXTREMITY ROM:     Active  Right Eval Left Eval  Hip flexion Grossly WNL  Hip extension   Hip abduction   Hip adduction   Hip internal rotation   Hip external rotation   Knee flexion   Knee extension   Ankle dorsiflexion   Ankle plantarflexion    Ankle inversion    Ankle eversion     (Blank rows = not tested)  LOWER EXTREMITY MMT:    MMT Right Eval Left Eval  Hip flexion 3+/5 3/5  Hip extension    Hip abduction    Hip adduction    Hip internal rotation    Hip external rotation    Knee flexion    Knee extension 4-/5 3+/5  Ankle dorsiflexion 4/5 4/5  Ankle plantarflexion    Ankle inversion    Ankle eversion    (Blank rows = not tested)  BED MOBILITY:  Sit to supine Complete Independence Supine  to sit Complete Independence Rolling to Right Complete Independence Rolling to Left Complete Independence  TRANSFERS: Assistive device utilized: Single point cane  Sit to stand: Modified independence Stand to sit: Modified independence Chair to chair: SBA  GAIT: Gait pattern: decreased arm swing- Left, decreased step length- Left, decreased stance time- Left, decreased stride length, knee flexed in stance- Left, trunk flexed, and narrow BOS Distance walked: various clinic distances Assistive device utilized: Single point cane Level of assistance: SBA  FUNCTIONAL TESTS:  5 times sit to stand: 12.22 seconds BUE support Timed up and go (TUG): 16.07 seconds w/ SPC SBA 6 minute walk test: To be assessed. 10 meter walk test: To be assessed.  PATIENT SURVEYS:  ABC scale To be assessed.  TODAY'S TREATMENT:  DATE: 03/21/2023 RUE in sitting prior to session: Vitals:   03/21/23 1457  BP: (!) 184/64  Pulse: 66   -Verbally reviewed HEP, pt states compliance and no issues.  She has current copy. -5xSTS:  12.16 seconds RUE support -TUG:  19.22 seconds w/ SPC on trial 1; 18.22 seconds w/ SPC on trial 2 - :  15.81 seconds w/ SPC = 0.63 m/sec OR 2.09 ft/sec  NuStep gear 1.0 progressed to 4.0 over 10 minutes (intermittent variable seconds of rest due to lateral hip cramping) using BUE/BLE for dynamic cardiovascular training and global strengthening.  PATIENT EDUCATION: Education details: Continue HEP.  Progress towards goals. Person educated: Patient Education method: Explanation Education comprehension: verbalized understanding  HOME EXERCISE PROGRAM: Access Code: [email or web address] URL: [email or web address] Date: 03/15/2023 Prepared by: Camille Bal  Exercises - Sit to Stand with Armchair  - 1 x daily - 7 x weekly - 2-3 sets - 10 reps - Supine  Bridge  - 1 x daily - 4 x weekly - 1-2 sets - 10 reps - Seated March  - 1 x daily - 4-5 x weekly - 2 sets - 20 reps - Seated Hamstring Curls with Resistance  - 1 x daily - 4-5 x weekly - 2 sets - 15 reps - Seated Knee Extension with Resistance  - 1 x daily - 4-5 x weekly - 2 sets - 10 reps  GOALS: Goals reviewed with patient? Yes  SHORT TERM GOALS: Target date: 03/24/2023  Pt will be independent and compliant with initial strength and balance HEP in order to maintain functional progress and improve mobility. Baseline: Established, pt reports independence and recent compliance (9/10) Goal status: MET  2.  Pt will decrease 5xSTS to </=12 seconds w/o UE support in order to demonstrate decreased risk for falls and improved functional bilateral LE strength and power. Baseline: 12.22 sec w/ BUE support; 12.16 seconds RUE support (9/10) Goal status: IN PROGRESS  3.  Pt will demonstrate TUG of </=13 seconds in order to decrease risk of falls and improve functional mobility using LRAD. Baseline: 16.07 seconds SBA w/ SPC; 19.22 seconds w/ SPC SBA (9/10) Goal status: NOT MET  4.  Pt will demonstrate a gait speed of >/=1.90 feet/sec in order to decrease risk for falls. Baseline: 1.70 ft/sec (8/23); 2.09 ft/sec (9/10) Goal status: MET  LONG TERM GOALS: Target date: 04/21/2023  Pt will be independent and compliant with advanced strength and balance HEP in order to maintain functional progress and improve mobility. Baseline: To be established. Goal status: INITIAL  2.  Pt will ambulate >/=565 feet on to demonstrate improved functional endurance for home and community participation. Baseline:  365 feet w/ SPC and SBA and 2 seated rest breaks (8/23) Goal status: INITIAL  3.  Pt will demonstrate a gait speed of >/=2.30 feet/sec in order to decrease risk for falls. Baseline: 1.70 ft/sec (8/23); 2.09 ft/sec (9/10) Goal status: REVISED  4.  Patient will improve ABC scale score to >/=80% to  demonstrate decreased fear of falling and improved confidence in dynamic stability. Baseline: 71.25% Goal status: INITIAL  ASSESSMENT:  CLINICAL IMPRESSION: Focus of skilled session today on assessing STGs with patient meeting 2 of 4 goals and progressing towards a third goal.  She required more time to complete the TUG assessment this visit.  While she still need RUE support to complete 5xSTS she was able to do so in 12.16 seconds which is a modest speed improvement towards goal.  She is compliant to established HEP  which would benefit from updates.  Her ambulatory speed improved to 2.09 ft/sec using SPC which was a great improvement.  She continues to benefit from skilled PT to further decrease fall risk and improve upright mobility.  OBJECTIVE IMPAIRMENTS: Abnormal gait, decreased activity tolerance, decreased balance, decreased endurance, decreased knowledge of condition, difficulty walking, decreased strength, increased edema, impaired flexibility, impaired sensation, improper body mechanics, postural dysfunction, and pain.   ACTIVITY LIMITATIONS: carrying, lifting, bending, standing, squatting, stairs, transfers, reach over head, and locomotion level  PARTICIPATION LIMITATIONS: meal prep, cleaning, laundry, shopping, community activity, and yard work  PERSONAL FACTORS: Age, Fitness, Past/current experiences, and 3+ comorbidities: left TKA, HTN, DM2/peripheral neuropathy  are also affecting patient's functional outcome.   REHAB POTENTIAL: Good  CLINICAL DECISION MAKING: Evolving/moderate complexity  EVALUATION COMPLEXITY: Moderate  PLAN:  PT FREQUENCY: 1x/week  PT DURATION: 8 weeks  PLANNED INTERVENTIONS: Therapeutic exercises, Therapeutic activity, Neuromuscular re-education, Balance training, Gait training, Patient/Family education, Self Care, Stair training, Vestibular training, Orthotic/Fit training, DME instructions, Manual therapy, and Re-evaluation  PLAN FOR NEXT SESSION:   Add to functional strength and static balance HEP.  Gait training w/ SPC - obstacle management and hurdles.  Postural stability.  Stair management.  Sadie Haber, PT, DPT 03/21/2023, 3:31 PM

## 2023-03-28 ENCOUNTER — Ambulatory Visit: Payer: Medicare Other | Admitting: Physical Therapy

## 2023-03-28 ENCOUNTER — Encounter: Payer: Self-pay | Admitting: Physical Therapy

## 2023-03-28 VITALS — BP 157/53 | HR 68

## 2023-03-28 DIAGNOSIS — M6281 Muscle weakness (generalized): Secondary | ICD-10-CM | POA: Diagnosis not present

## 2023-03-28 DIAGNOSIS — R2689 Other abnormalities of gait and mobility: Secondary | ICD-10-CM

## 2023-03-28 DIAGNOSIS — R2681 Unsteadiness on feet: Secondary | ICD-10-CM | POA: Diagnosis not present

## 2023-03-28 NOTE — Patient Instructions (Signed)
Access Code: [email or web address] URL: [email or web address] Date: 03/28/2023 Prepared by: Camille Bal  Exercises - Sit to Stand with Armchair  - 1 x daily - 7 x weekly - 2-3 sets - 10 reps - Supine Bridge  - 1 x daily - 4 x weekly - 1-2 sets - 10 reps - Seated March  - 1 x daily - 4-5 x weekly - 2 sets - 20 reps - Seated Hamstring Curls with Resistance  - 1 x daily - 4-5 x weekly - 2 sets - 15 reps - Seated Knee Extension with Resistance  - 1 x daily - 4-5 x weekly - 2 sets - 10 reps - Seated Scapular Retraction  - 1 x daily - 4-5 x weekly - 2 sets - 10 reps - Scapular retraction with resistance  - 1 x daily - 4-5 x weekly - 1-2 sets - 10 reps

## 2023-03-28 NOTE — Therapy (Unsigned)
OUTPATIENT PHYSICAL THERAPY NEURO TREATMENT   Patient Name: Sophia Suarez MRN: [id number] DOB:[birth year], 82 y.o., female Today's Date: 03/28/2023   PCP: Mila Palmer, MD REFERRING PROVIDER: Levert Feinstein, MD  END OF SESSION:  PT End of Session - 03/28/23 1500     Visit Number 5    Number of Visits 9   8 + eval   Date for PT Re-Evaluation 05/05/23   pushed out due to scheduling delay   Authorization Type [employer] MEDICARE    Progress Note Due on Visit 10    PT Start Time 1449    PT Stop Time 1531    PT Time Calculation (min) 42 min    Equipment Utilized During Treatment Gait belt    Activity Tolerance Patient tolerated treatment well;Patient limited by pain;Patient limited by fatigue    Behavior During Therapy [facility] for tasks assessed/performed             Past Medical History:  Diagnosis Date   Allergic rhinitis, seasonal    Anemia    Arthritis    Asthma    Carotid artery occlusion    Chronic kidney disease    Chronic pain    Complication of anesthesia    makes her hyper   Diabetes mellitus    no medication  diet controlled   Diverticulitis    Dyspnea    with exertion   GERD (gastroesophageal reflux disease)    Heart murmur    Hyperlipidemia    Hypertension    Lumbago    Panic attacks    Stroke [facility])    Urinary incontinence    Past Surgical History:  Procedure Laterality Date   ABDOMINAL HYSTERECTOMY  1973   Partial   APPENDECTOMY  1958   BIOPSY THYROID  Dec 05, 2013   Tumor- Benign   COLECTOMY  2003   extent uncertain   EYE SURGERY Right October 10, 2014   Cataract   EYE SURGERY Left  October 17, 2014   Cataract   KNEE ARTHROSCOPY Left    MASS EXCISION Right 04/23/2018   Procedure: EXCISION OF SKIN MASS WITH PRIMARY CLOSURE;  Surgeon: Serena Colonel, MD;  Location: [facility];  Service: ENT;  Laterality: Right;   OVARIAN CYST SURGERY     PAROTIDECTOMY Right 04/23/2018   Procedure: Superficial Parotidectomy with nerve dissection;   Surgeon: Serena Colonel, MD;  Location: [facility];  Service: ENT;  Laterality: Right;   PARTIAL HYSTERECTOMY     TOTAL KNEE ARTHROPLASTY Left 09/15/2017   Procedure: TOTAL KNEE ARTHROPLASTY;  Surgeon: Gean Birchwood, MD;  Location: [facility] OR;  Service: Orthopedics;  Laterality: Left;   Patient Active Problem List   Diagnosis Date Noted   TIA (transient ischemic attack) 01/26/2023   Gait abnormality 01/26/2023   Age related osteoporosis 08/13/2021   Decreased estrogen level 08/13/2021   Recurrent major depression (HCC) 08/13/2021   Senile purpura (HCC) 08/13/2021   Acute on chronic renal failure (HCC) 10/13/2020   Aortic valve disorder 10/13/2020   Atrophy of vagina 10/13/2020   Body mass index (BMI) 32.0-32.9, adult 10/13/2020   Chronic kidney disease, stage 3 unspecified (HCC) 10/13/2020   Diabetic renal disease (HCC) 10/13/2020   Constipation 10/13/2020   Disorder of arteries and arterioles, unspecified (HCC) 10/13/2020   Dysphagia 10/13/2020   Dyssomnia 10/13/2020   Essential tremor 10/13/2020   Flatulence 10/13/2020   History of hysterectomy 10/13/2020   Lichen sclerosus 10/13/2020   Major depression in partial remission (HCC)  10/13/2020   Mild intermittent asthma 10/13/2020   Mild recurrent major depression (HCC) 10/13/2020   Morbid obesity (HCC) 10/13/2020   Nausea and vomiting 10/13/2020   Nontoxic single thyroid nodule 10/13/2020   Overactive bladder 10/13/2020   Primary open angle glaucoma (POAG) of both eyes, mild stage 10/13/2020   Stress incontinence (female) (female) 10/13/2020   Vitamin D deficiency 10/13/2020   Mixed incontinence 07/29/2020   Rhinorrhea 02/04/2020   Infection of skin of neck 01/02/2020   Parotid mass 04/23/2018   Parotid abscess 02/21/2018   Primary osteoarthritis of left knee 09/15/2017   Degenerative arthritis of left knee 09/12/2017   Preop cardiovascular exam 07/23/2013   Weight loss, non-intentional 07/03/2013   Aortic stenosis  05/21/2013   Occlusion and stenosis of carotid artery without mention of cerebral infarction 06/15/2012   Hypertension    Hyperlipidemia    GERD (gastroesophageal reflux disease)    Diverticulitis    Chronic pain    Lumbago    Heart murmur    Panic attacks    Stroke [facility])    Allergic rhinitis, seasonal    Carotid artery stenosis, right 11/18/2011    ONSET DATE: 01/16/2023 at 3am  REFERRING DIAG: G45.9 (ICD-10-CM) - TIA (transient ischemic attack) R26.9 (ICD-10-CM) - Gait abnormality  THERAPY DIAG:  Other abnormalities of gait and mobility  Muscle weakness (generalized)  Unsteadiness on feet  Rationale for Evaluation and Treatment: Rehabilitation  SUBJECTIVE:                                                                                                                                                                                             SUBJECTIVE STATEMENT: Patient states she had an allergic reaction to her flu/Covid shot and her family member wanted her to go to the ED but she did not want to.  She states all symptoms have passed, but she has not been doing much due to this. Pt accompanied by: self - she drives herself  PERTINENT HISTORY: Scoliosis, L TKA, peripheral neuropathy, HLD, HTN, Bilateral Carotid stenosis (R worse), GERD, aortic stenosis, CKD stage 3, osteoporosis, depression, DM2 (diet controlled per patient)  Patient had sudden onset of left sided paresthesia and weakness with notable facial droop in early morning hours of January 16, 2023.  She did not seek emergency attention, but scheduled a doctor's appt to follow-up.  PAIN:  Are you having pain? Yes: NPRS scale: 5-7/10 Pain location: low back (5/10); neck (7/10) Pain description: constant deep pain Aggravating factors: standing Relieving factors: sitting or laying down  PRECAUTIONS: Fall; pt wears glasses to drive and read, hears best on left  side; bilateral carotid stenosis (R worse than L)  RED  FLAGS: None   WEIGHT BEARING RESTRICTIONS: No  FALLS: Has patient fallen in last 6 months? No - several near misses as she does not use the cane in her house due to clutter/furniture  LIVING ENVIRONMENT: Lives with: lives alone Lives in: House/apartment Stairs: Yes: Internal: 15 steps; can reach both and into basement area where washer/dryer are and External: 2 steps; on right going up Has following equipment at home: Single point cane, Walker - 2 wheeled, Tour manager, and Grab bars  PLOF: Independent and Independent with community mobility with device - getting off low surfaces like couch are hard  PATIENT GOALS: "To maintain my independence!"  OBJECTIVE:   DIAGNOSTIC FINDINGS:  Brain MRI 02/18/2023 IMPRESSION: MRI scan of the brain without contrast showing mild age-related changes of chronic small vessel disease.  No acute abnormalities noted.  None from time of TIA accessible to PT.  COGNITION: Overall cognitive status: Within functional limits for tasks assessed   SENSATION: Light touch: WFL and more tingling on LLE  COORDINATION: LE RAMS:  WNL BLE Heel-to-shin:  WNL  EDEMA:  Baseline ankle edema bilaterally, not wearing compression stockings, medically managed  MUSCLE TONE: None noted in BLE  POSTURE: rounded shoulders, forward head, increased thoracic kyphosis, and left weight shift from left scoliosis  LOWER EXTREMITY ROM:     Active  Right Eval Left Eval  Hip flexion Grossly WNL  Hip extension   Hip abduction   Hip adduction   Hip internal rotation   Hip external rotation   Knee flexion   Knee extension   Ankle dorsiflexion   Ankle plantarflexion    Ankle inversion    Ankle eversion     (Blank rows = not tested)  LOWER EXTREMITY MMT:    MMT Right Eval Left Eval  Hip flexion 3+/5 3/5  Hip extension    Hip abduction    Hip adduction    Hip internal rotation    Hip external rotation    Knee flexion    Knee extension 4-/5 3+/5  Ankle  dorsiflexion 4/5 4/5  Ankle plantarflexion    Ankle inversion    Ankle eversion    (Blank rows = not tested)  BED MOBILITY:  Sit to supine Complete Independence Supine to sit Complete Independence Rolling to Right Complete Independence Rolling to Left Complete Independence  TRANSFERS: Assistive device utilized: Single point cane  Sit to stand: Modified independence Stand to sit: Modified independence Chair to chair: SBA  GAIT: Gait pattern: decreased arm swing- Left, decreased step length- Left, decreased stance time- Left, decreased stride length, knee flexed in stance- Left, trunk flexed, and narrow BOS Distance walked: various clinic distances Assistive device utilized: Single point cane Level of assistance: SBA  FUNCTIONAL TESTS:  5 times sit to stand: 12.22 seconds BUE support Timed up and go (TUG): 16.07 seconds w/ SPC SBA 6 minute walk test: To be assessed. 10 meter walk test: To be assessed.  PATIENT SURVEYS:  ABC scale To be assessed.  TODAY'S TREATMENT:  DATE: 03/28/2023 RUE in sitting prior to session: Vitals:   03/28/23 1456 03/28/23 1503  BP: (!) 178/58 (!) 157/53  Pulse: 67 68   -NuStep gear 2.0 progressed to 5.0 over 8 minutes continuously using BUE/BLE for dynamic cardiovascular training and global strengthening.  Several minutes seated rest following task.  RPE 7/10. -Checked SpO2 97% and HR 67 bpm following task due to reported shortness of breath several minutes after task completed. -Scapular squeezes x15 > w/ red theraband and slight ER x15 -Seated rows x15 w/ red theraband -Seated shoulder shrugs x20 > backwards shoulder rolls x20  PATIENT EDUCATION: Education details: Continue HEP w/ postural additions. Person educated: Patient Education method: Explanation Education comprehension: verbalized understanding  HOME EXERCISE  PROGRAM: Access Code: [email or web address] URL: [email or web address] Date: 03/28/2023 Prepared by: Camille Bal  Exercises - Sit to Stand with Armchair  - 1 x daily - 7 x weekly - 2-3 sets - 10 reps - Supine Bridge  - 1 x daily - 4 x weekly - 1-2 sets - 10 reps - Seated March  - 1 x daily - 4-5 x weekly - 2 sets - 20 reps - Seated Hamstring Curls with Resistance  - 1 x daily - 4-5 x weekly - 2 sets - 15 reps - Seated Knee Extension with Resistance  - 1 x daily - 4-5 x weekly - 2 sets - 10 reps - Seated Scapular Retraction  - 1 x daily - 4-5 x weekly - 2 sets - 10 reps - Scapular retraction with resistance  - 1 x daily - 4-5 x weekly - 1-2 sets - 10 reps  GOALS: Goals reviewed with patient? Yes  SHORT TERM GOALS: Target date: 03/24/2023  Pt will be independent and compliant with initial strength and balance HEP in order to maintain functional progress and improve mobility. Baseline: Established, pt reports independence and recent compliance (9/10) Goal status: MET  2.  Pt will decrease 5xSTS to </=12 seconds w/o UE support in order to demonstrate decreased risk for falls and improved functional bilateral LE strength and power. Baseline: 12.22 sec w/ BUE support; 12.16 seconds RUE support (9/10) Goal status: IN PROGRESS  3.  Pt will demonstrate TUG of </=13 seconds in order to decrease risk of falls and improve functional mobility using LRAD. Baseline: 16.07 seconds SBA w/ SPC; 19.22 seconds w/ SPC SBA (9/10) Goal status: NOT MET  4.  Pt will demonstrate a gait speed of >/=1.90 feet/sec in order to decrease risk for falls. Baseline: 1.70 ft/sec (8/23); 2.09 ft/sec (9/10) Goal status: MET  LONG TERM GOALS: Target date: 04/21/2023  Pt will be independent and compliant with advanced strength and balance HEP in order to maintain functional progress and improve mobility. Baseline: To be established. Goal status: INITIAL  2.  Pt will ambulate >/=565 feet on to  demonstrate improved functional endurance for home and community participation. Baseline:  365 feet w/ SPC and SBA and 2 seated rest breaks (8/23) Goal status: INITIAL  3.  Pt will demonstrate a gait speed of >/=2.30 feet/sec in order to decrease risk for falls. Baseline: 1.70 ft/sec (8/23); 2.09 ft/sec (9/10) Goal status: REVISED  4.  Patient will improve ABC scale score to >/=80% to demonstrate decreased fear of falling and improved confidence in dynamic stability. Baseline: 71.25% Goal status: INITIAL  ASSESSMENT:  CLINICAL IMPRESSION: Ongoing progression of cardiovascular endurance today.  Patient remains largely challenged in this aspect though vitals were stable during session.  Made additions to her HEP  to focus on improved postural strength and correction.  She tolerates these exercises without issue.  OBJECTIVE IMPAIRMENTS: Abnormal gait, decreased activity tolerance, decreased balance, decreased endurance, decreased knowledge of condition, difficulty walking, decreased strength, increased edema, impaired flexibility, impaired sensation, improper body mechanics, postural dysfunction, and pain.   ACTIVITY LIMITATIONS: carrying, lifting, bending, standing, squatting, stairs, transfers, reach over head, and locomotion level  PARTICIPATION LIMITATIONS: meal prep, cleaning, laundry, shopping, community activity, and yard work  PERSONAL FACTORS: Age, Fitness, Past/current experiences, and 3+ comorbidities: left TKA, HTN, DM2/peripheral neuropathy  are also affecting patient's functional outcome.   REHAB POTENTIAL: Good  CLINICAL DECISION MAKING: Evolving/moderate complexity  EVALUATION COMPLEXITY: Moderate  PLAN:  PT FREQUENCY: 1x/week  PT DURATION: 8 weeks  PLANNED INTERVENTIONS: Therapeutic exercises, Therapeutic activity, Neuromuscular re-education, Balance training, Gait training, Patient/Family education, Self Care, Stair training, Vestibular training, Orthotic/Fit  training, DME instructions, Manual therapy, and Re-evaluation  PLAN FOR NEXT SESSION:  Add to functional strength and static balance HEP.  Gait training w/ SPC - obstacle management and hurdles.  Postural stability - wall slides, lat pulldowns, seated D1/D2.  Stair management.  Endurance training - maybe circuits?  Sadie Haber, PT, DPT 03/28/2023, 3:32 PM

## 2023-04-04 ENCOUNTER — Ambulatory Visit: Payer: Medicare Other | Admitting: Physical Therapy

## 2023-04-04 DIAGNOSIS — E1122 Type 2 diabetes mellitus with diabetic chronic kidney disease: Secondary | ICD-10-CM | POA: Diagnosis not present

## 2023-04-04 DIAGNOSIS — M818 Other osteoporosis without current pathological fracture: Secondary | ICD-10-CM | POA: Diagnosis not present

## 2023-04-04 DIAGNOSIS — G4700 Insomnia, unspecified: Secondary | ICD-10-CM | POA: Diagnosis not present

## 2023-04-04 DIAGNOSIS — Z9989 Dependence on other enabling machines and devices: Secondary | ICD-10-CM | POA: Diagnosis not present

## 2023-04-04 DIAGNOSIS — E041 Nontoxic single thyroid nodule: Secondary | ICD-10-CM | POA: Diagnosis not present

## 2023-04-04 DIAGNOSIS — E785 Hyperlipidemia, unspecified: Secondary | ICD-10-CM | POA: Diagnosis not present

## 2023-04-04 DIAGNOSIS — Z0000 Encounter for general adult medical examination without abnormal findings: Secondary | ICD-10-CM | POA: Diagnosis not present

## 2023-04-04 DIAGNOSIS — Z79899 Other long term (current) drug therapy: Secondary | ICD-10-CM | POA: Diagnosis not present

## 2023-04-04 DIAGNOSIS — I10 Essential (primary) hypertension: Secondary | ICD-10-CM | POA: Diagnosis not present

## 2023-04-05 ENCOUNTER — Encounter: Payer: Self-pay | Admitting: Physical Therapy

## 2023-04-05 ENCOUNTER — Ambulatory Visit: Payer: Medicare Other | Admitting: Physical Therapy

## 2023-04-05 VITALS — BP 159/61 | HR 64

## 2023-04-05 DIAGNOSIS — R2681 Unsteadiness on feet: Secondary | ICD-10-CM | POA: Diagnosis not present

## 2023-04-05 DIAGNOSIS — M6281 Muscle weakness (generalized): Secondary | ICD-10-CM | POA: Diagnosis not present

## 2023-04-05 DIAGNOSIS — R2689 Other abnormalities of gait and mobility: Secondary | ICD-10-CM

## 2023-04-05 NOTE — Therapy (Signed)
OUTPATIENT PHYSICAL THERAPY NEURO TREATMENT   Patient Name: Sophia Suarez MRN: [id number] DOB:1941-01-15, 82 y.o., female Today's Date: 04/05/2023   PCP: Mila Palmer, MD REFERRING PROVIDER: Levert Feinstein, MD  END OF SESSION:  PT End of Session - 04/05/23 1503     Visit Number 6    Number of Visits 9   8 + eval   Date for PT Re-Evaluation 05/05/23   pushed out due to scheduling delay   Authorization Type [employer] MEDICARE    Progress Note Due on Visit 10    PT Start Time 1450    PT Stop Time 1533    PT Time Calculation (min) 43 min    Equipment Utilized During Treatment Gait belt    Activity Tolerance Patient tolerated treatment well    Behavior During Therapy WFL for tasks assessed/performed             Past Medical History:  Diagnosis Date   Allergic rhinitis, seasonal    Anemia    Arthritis    Asthma    Carotid artery occlusion    Chronic kidney disease    Chronic pain    Complication of anesthesia    makes her hyper   Diabetes mellitus    no medication  diet controlled   Diverticulitis    Dyspnea    with exertion   GERD (gastroesophageal reflux disease)    Heart murmur    Hyperlipidemia    Hypertension    Lumbago    Panic attacks    Stroke [facility])    Urinary incontinence    Past Surgical History:  Procedure Laterality Date   ABDOMINAL HYSTERECTOMY  1973   Partial   APPENDECTOMY  1958   BIOPSY THYROID  Dec 05, 2013   Tumor- Benign   COLECTOMY  2003   extent uncertain   EYE SURGERY Right October 10, 2014   Cataract   EYE SURGERY Left  October 17, 2014   Cataract   KNEE ARTHROSCOPY Left    MASS EXCISION Right 04/23/2018   Procedure: EXCISION OF SKIN MASS WITH PRIMARY CLOSURE;  Surgeon: Serena Colonel, MD;  Location: [facility];  Service: ENT;  Laterality: Right;   OVARIAN CYST SURGERY     PAROTIDECTOMY Right 04/23/2018   Procedure: Superficial Parotidectomy with nerve dissection;  Surgeon: Serena Colonel, MD;  Location: [facility];  Service: ENT;  Laterality: Right;   PARTIAL HYSTERECTOMY     TOTAL KNEE ARTHROPLASTY Left 09/15/2017   Procedure: TOTAL KNEE ARTHROPLASTY;  Surgeon: Gean Birchwood, MD;  Location: [facility];  Service: Orthopedics;  Laterality: Left;   Patient Active Problem List   Diagnosis Date Noted   TIA (transient ischemic attack) 01/26/2023   Gait abnormality 01/26/2023   Age related osteoporosis 08/13/2021   Decreased estrogen level 08/13/2021   Recurrent major depression (HCC) 08/13/2021   Senile purpura (HCC) 08/13/2021   Acute on chronic renal failure (HCC) 10/13/2020   Aortic valve disorder 10/13/2020   Atrophy of vagina 10/13/2020   Body mass index (BMI) 32.0-32.9, adult 10/13/2020   Chronic kidney disease, stage 3 unspecified (HCC) 10/13/2020   Diabetic renal disease (HCC) 10/13/2020   Constipation 10/13/2020   Disorder of arteries and arterioles, unspecified (HCC) 10/13/2020   Dysphagia 10/13/2020   Dyssomnia 10/13/2020   Essential tremor 10/13/2020   Flatulence 10/13/2020   History of hysterectomy 10/13/2020   Lichen sclerosus 10/13/2020   Major depression in partial remission (HCC) 10/13/2020   Mild intermittent asthma  10/13/2020   Mild recurrent major depression (HCC) 10/13/2020   Morbid obesity (HCC) 10/13/2020   Nausea and vomiting 10/13/2020   Nontoxic single thyroid nodule 10/13/2020   Overactive bladder 10/13/2020   Primary open angle glaucoma (POAG) of both eyes, mild stage 10/13/2020   Stress incontinence (female) (female) 10/13/2020   Vitamin D deficiency 10/13/2020   Mixed incontinence 07/29/2020   Rhinorrhea 02/04/2020   Infection of skin of neck 01/02/2020   Parotid mass 04/23/2018   Parotid abscess 02/21/2018   Primary osteoarthritis of left knee 09/15/2017   Degenerative arthritis of left knee 09/12/2017   Preop cardiovascular exam 07/23/2013   Weight loss, non-intentional 07/03/2013   Aortic stenosis 05/21/2013   Occlusion and stenosis of carotid artery  without mention of cerebral infarction 06/15/2012   Hypertension    Hyperlipidemia    GERD (gastroesophageal reflux disease)    Diverticulitis    Chronic pain    Lumbago    Heart murmur    Panic attacks    Stroke [facility])    Allergic rhinitis, seasonal    Carotid artery stenosis, right 11/18/2011    ONSET DATE: 01/16/2023 at 3am  REFERRING DIAG: G45.9 (ICD-10-CM) - TIA (transient ischemic attack) R26.9 (ICD-10-CM) - Gait abnormality  THERAPY DIAG:  Other abnormalities of gait and mobility  Muscle weakness (generalized)  Unsteadiness on feet  Rationale for Evaluation and Treatment: Rehabilitation  SUBJECTIVE:                                                                                                                                                                                             SUBJECTIVE STATEMENT: Patient states her BP was up and down at her primary doctor yesterday.  She states the doctor told her that she still has some infection going on that they think is driving her BP up.  She ambulates in with SPC.  She denies falls.  She reports several personal life stressors both medical and non-medical.  Pt states she downloaded a chair yoga program but has not looked at it yet and would like to maybe review this in a future session, but not today. Pt accompanied by: self - she drives herself  PERTINENT HISTORY: Scoliosis, L TKA, peripheral neuropathy, HLD, HTN, Bilateral Carotid stenosis (R worse), GERD, aortic stenosis, CKD stage 3, osteoporosis, depression, DM2 (diet controlled per patient)  Patient had sudden onset of left sided paresthesia and weakness with notable facial droop in early morning hours of January 16, 2023.  She did not seek emergency attention, but scheduled a doctor's appt to follow-up.  PAIN:  Are you having pain? Yes: NPRS scale:  2.5-3/10 Pain location: low back Pain description: constant deep pain Aggravating factors: standing Relieving factors:  sitting or laying down, warm bath and arthritis cream  PRECAUTIONS: Fall; pt wears glasses to drive and read, hears best on left side; bilateral carotid stenosis (R worse than L)  RED FLAGS: None   WEIGHT BEARING RESTRICTIONS: No  FALLS: Has patient fallen in last 6 months? No - several near misses as she does not use the cane in her house due to clutter/furniture  LIVING ENVIRONMENT: Lives with: lives alone Lives in: House/apartment Stairs: Yes: Internal: 15 steps; can reach both and into basement area where washer/dryer are and External: 2 steps; on right going up Has following equipment at home: Single point cane, Walker - 2 wheeled, Tour manager, and Grab bars  PLOF: Independent and Independent with community mobility with device - getting off low surfaces like couch are hard  PATIENT GOALS: "To maintain my independence!"  OBJECTIVE:   DIAGNOSTIC FINDINGS:  Brain MRI 02/18/2023 IMPRESSION: MRI scan of the brain without contrast showing mild age-related changes of chronic small vessel disease.  No acute abnormalities noted.  None from time of TIA accessible to PT.  COGNITION: Overall cognitive status: Within functional limits for tasks assessed   SENSATION: Light touch: WFL and more tingling on LLE  COORDINATION: LE RAMS:  WNL BLE Heel-to-shin:  WNL  EDEMA:  Baseline ankle edema bilaterally, not wearing compression stockings, medically managed  MUSCLE TONE: None noted in BLE  POSTURE: rounded shoulders, forward head, increased thoracic kyphosis, and left weight shift from left scoliosis  LOWER EXTREMITY ROM:     Active  Right Eval Left Eval  Hip flexion Grossly WNL  Hip extension   Hip abduction   Hip adduction   Hip internal rotation   Hip external rotation   Knee flexion   Knee extension   Ankle dorsiflexion   Ankle plantarflexion    Ankle inversion    Ankle eversion     (Blank rows = not tested)  LOWER EXTREMITY MMT:    MMT Right Eval  Left Eval  Hip flexion 3+/5 3/5  Hip extension    Hip abduction    Hip adduction    Hip internal rotation    Hip external rotation    Knee flexion    Knee extension 4-/5 3+/5  Ankle dorsiflexion 4/5 4/5  Ankle plantarflexion    Ankle inversion    Ankle eversion    (Blank rows = not tested)  BED MOBILITY:  Sit to supine Complete Independence Supine to sit Complete Independence Rolling to Right Complete Independence Rolling to Left Complete Independence  TRANSFERS: Assistive device utilized: Single point cane  Sit to stand: Modified independence Stand to sit: Modified independence Chair to chair: SBA  GAIT: Gait pattern: decreased arm swing- Left, decreased step length- Left, decreased stance time- Left, decreased stride length, knee flexed in stance- Left, trunk flexed, and narrow BOS Distance walked: various clinic distances Assistive device utilized: Single point cane Level of assistance: SBA  FUNCTIONAL TESTS:  5 times sit to stand: 12.22 seconds BUE support Timed up and go (TUG): 16.07 seconds w/ SPC SBA 6 minute walk test: To be assessed. 10 meter walk test: To be assessed.  PATIENT SURVEYS:  ABC scale To be assessed.  TODAY'S TREATMENT:  DATE: 04/05/2023 RUE in sitting prior to session (first w/ omron machine, second w/ philips machine): Vitals:   04/05/23 1458 04/05/23 1500  BP: (!) 190/101 (!) 159/61  Pulse: 66 64  -Wrote BP down for pt log  -Obstacle negotiation:  soft blue mat w/ gumdrops for variable suface > cone weaving > 4" hurdles performed twice w/ HHA and SPC, pt fatigued following requiring prolonged seated rest  Circuit 1:  Seated w/ back unsupported D1/D2 x10 each laterality > STS w/ 3.3lb ball hold x10 > standing trunk rotation w/ 3.3lb ball short lever arm x10 each side; performed 3x w/ rest between cycles, pt requires  more frequent rest breaks as cycles progressed  PATIENT EDUCATION: Education details: Continue HEP. Person educated: Patient Education method: Explanation Education comprehension: verbalized understanding  HOME EXERCISE PROGRAM: Access Code: [email or web address] URL: [email or web address] Date: 03/28/2023 Prepared by: Camille Bal  Exercises - Sit to Stand with Armchair  - 1 x daily - 7 x weekly - 2-3 sets - 10 reps - Supine Bridge  - 1 x daily - 4 x weekly - 1-2 sets - 10 reps - Seated March  - 1 x daily - 4-5 x weekly - 2 sets - 20 reps - Seated Hamstring Curls with Resistance  - 1 x daily - 4-5 x weekly - 2 sets - 15 reps - Seated Knee Extension with Resistance  - 1 x daily - 4-5 x weekly - 2 sets - 10 reps - Seated Scapular Retraction  - 1 x daily - 4-5 x weekly - 2 sets - 10 reps - Scapular retraction with resistance  - 1 x daily - 4-5 x weekly - 1-2 sets - 10 reps  GOALS: Goals reviewed with patient? Yes  SHORT TERM GOALS: Target date: 03/24/2023  Pt will be independent and compliant with initial strength and balance HEP in order to maintain functional progress and improve mobility. Baseline: Established, pt reports independence and recent compliance (9/10) Goal status: MET  2.  Pt will decrease 5xSTS to </=12 seconds w/o UE support in order to demonstrate decreased risk for falls and improved functional bilateral LE strength and power. Baseline: 12.22 sec w/ BUE support; 12.16 seconds RUE support (9/10) Goal status: IN PROGRESS  3.  Pt will demonstrate TUG of </=13 seconds in order to decrease risk of falls and improve functional mobility using LRAD. Baseline: 16.07 seconds SBA w/ SPC; 19.22 seconds w/ SPC SBA (9/10) Goal status: NOT MET  4.  Pt will demonstrate a gait speed of >/=1.90 feet/sec in order to decrease risk for falls. Baseline: 1.70 ft/sec (8/23); 2.09 ft/sec (9/10) Goal status: MET  LONG TERM GOALS: Target date: 04/21/2023  Pt will be  independent and compliant with advanced strength and balance HEP in order to maintain functional progress and improve mobility. Baseline: To be established. Goal status: INITIAL  2.  Pt will ambulate >/=565 feet on to demonstrate improved functional endurance for home and community participation. Baseline:  365 feet w/ SPC and SBA and 2 seated rest breaks (8/23) Goal status: INITIAL  3.  Pt will demonstrate a gait speed of >/=2.30 feet/sec in order to decrease risk for falls. Baseline: 1.70 ft/sec (8/23); 2.09 ft/sec (9/10) Goal status: REVISED  4.  Patient will improve ABC scale score to >/=80% to demonstrate decreased fear of falling and improved confidence in dynamic stability. Baseline: 71.25% Goal status: INITIAL  ASSESSMENT:  CLINICAL IMPRESSION: Focus of skilled session today on addressing endurance and functional strengthening with circuit  training.  She was highly challenged by continuous activity due to ongoing aerobic limitations.  She would benefit from ongoing PT to address imbalance in dynamic conditions and fear of falling.  Will continue per POC.  OBJECTIVE IMPAIRMENTS: Abnormal gait, decreased activity tolerance, decreased balance, decreased endurance, decreased knowledge of condition, difficulty walking, decreased strength, increased edema, impaired flexibility, impaired sensation, improper body mechanics, postural dysfunction, and pain.   ACTIVITY LIMITATIONS: carrying, lifting, bending, standing, squatting, stairs, transfers, reach over head, and locomotion level  PARTICIPATION LIMITATIONS: meal prep, cleaning, laundry, shopping, community activity, and yard work  PERSONAL FACTORS: Age, Fitness, Past/current experiences, and 3+ comorbidities: left TKA, HTN, DM2/peripheral neuropathy  are also affecting patient's functional outcome.   REHAB POTENTIAL: Good  CLINICAL DECISION MAKING: Evolving/moderate complexity  EVALUATION COMPLEXITY: Moderate  PLAN:  PT  FREQUENCY: 1x/week  PT DURATION: 8 weeks  PLANNED INTERVENTIONS: Therapeutic exercises, Therapeutic activity, Neuromuscular re-education, Balance training, Gait training, Patient/Family education, Self Care, Stair training, Vestibular training, Orthotic/Fit training, DME instructions, Manual therapy, and Re-evaluation  PLAN FOR NEXT SESSION:  Add to functional strength and static balance HEP.  Gait training w/ SPC. Postural stability. Stair management.  Endurance training - circuit for periscapular strengthening (wall slides, lat pulldowns).  Has pt utilized her chair yoga program?  If she has looked at her yoga program review the printout in therapy office for comparison.  Sadie Haber, PT, DPT 04/05/2023, 3:38 PM

## 2023-04-11 ENCOUNTER — Encounter: Payer: Self-pay | Admitting: Physical Therapy

## 2023-04-11 ENCOUNTER — Ambulatory Visit: Payer: Medicare Other | Attending: Neurology | Admitting: Physical Therapy

## 2023-04-11 VITALS — BP 161/75 | HR 69

## 2023-04-11 DIAGNOSIS — M6281 Muscle weakness (generalized): Secondary | ICD-10-CM | POA: Insufficient documentation

## 2023-04-11 DIAGNOSIS — R2689 Other abnormalities of gait and mobility: Secondary | ICD-10-CM | POA: Insufficient documentation

## 2023-04-11 DIAGNOSIS — R2681 Unsteadiness on feet: Secondary | ICD-10-CM | POA: Diagnosis not present

## 2023-04-11 NOTE — Therapy (Signed)
OUTPATIENT PHYSICAL THERAPY NEURO TREATMENT   Patient Name: Sophia Suarez MRN: [id number] DOB:[birth year], 82 y.o., female Today's Date: 04/11/2023   PCP: Mila Palmer, MD REFERRING PROVIDER: Levert Feinstein, MD  END OF SESSION:  PT End of Session - 04/11/23 1457     Visit Number 7    Number of Visits 9   8 + eval   Date for PT Re-Evaluation 05/05/23   pushed out due to scheduling delay   Authorization Type [employer] MEDICARE    Progress Note Due on Visit 10    PT Start Time 1449    PT Stop Time 1530    PT Time Calculation (min) 41 min    Equipment Utilized During Treatment Gait belt    Activity Tolerance Patient tolerated treatment well    Behavior During Therapy WFL for tasks assessed/performed             Past Medical History:  Diagnosis Date   Allergic rhinitis, seasonal    Anemia    Arthritis    Asthma    Carotid artery occlusion    Chronic kidney disease    Chronic pain    Complication of anesthesia    makes her hyper   Diabetes mellitus    no medication  diet controlled   Diverticulitis    Dyspnea    with exertion   GERD (gastroesophageal reflux disease)    Heart murmur    Hyperlipidemia    Hypertension    Lumbago    Panic attacks    Stroke [facility])    Urinary incontinence    Past Surgical History:  Procedure Laterality Date   ABDOMINAL HYSTERECTOMY  1973   Partial   APPENDECTOMY  1958   BIOPSY THYROID  Dec 05, 2013   Tumor- Benign   COLECTOMY  2003   extent uncertain   EYE SURGERY Right October 10, 2014   Cataract   EYE SURGERY Left  October 17, 2014   Cataract   KNEE ARTHROSCOPY Left    MASS EXCISION Right 04/23/2018   Procedure: EXCISION OF SKIN MASS WITH PRIMARY CLOSURE;  Surgeon: Serena Colonel, MD;  Location: [facility];  Service: ENT;  Laterality: Right;   OVARIAN CYST SURGERY     PAROTIDECTOMY Right 04/23/2018   Procedure: Superficial Parotidectomy with nerve dissection;  Surgeon: Serena Colonel, MD;  Location: [facility];  Service: ENT;  Laterality: Right;   PARTIAL HYSTERECTOMY     TOTAL KNEE ARTHROPLASTY Left 09/15/2017   Procedure: TOTAL KNEE ARTHROPLASTY;  Surgeon: Gean Birchwood, MD;  Location: [facility];  Service: Orthopedics;  Laterality: Left;   Patient Active Problem List   Diagnosis Date Noted   TIA (transient ischemic attack) 01/26/2023   Gait abnormality 01/26/2023   Age related osteoporosis 08/13/2021   Decreased estrogen level 08/13/2021   Recurrent major depression (HCC) 08/13/2021   Senile purpura (HCC) 08/13/2021   Acute on chronic renal failure (HCC) 10/13/2020   Aortic valve disorder 10/13/2020   Atrophy of vagina 10/13/2020   Body mass index (BMI) 32.0-32.9, adult 10/13/2020   Chronic kidney disease, stage 3 unspecified (HCC) 10/13/2020   Diabetic renal disease (HCC) 10/13/2020   Constipation 10/13/2020   Disorder of arteries and arterioles, unspecified (HCC) 10/13/2020   Dysphagia 10/13/2020   Dyssomnia 10/13/2020   Essential tremor 10/13/2020   Flatulence 10/13/2020   History of hysterectomy 10/13/2020   Lichen sclerosus 10/13/2020   Major depression in partial remission (HCC) 10/13/2020   Mild intermittent asthma  10/13/2020   Mild recurrent major depression (HCC) 10/13/2020   Morbid obesity (HCC) 10/13/2020   Nausea and vomiting 10/13/2020   Nontoxic single thyroid nodule 10/13/2020   Overactive bladder 10/13/2020   Primary open angle glaucoma (POAG) of both eyes, mild stage 10/13/2020   Stress incontinence (female) (female) 10/13/2020   Vitamin D deficiency 10/13/2020   Mixed incontinence 07/29/2020   Rhinorrhea 02/04/2020   Infection of skin of neck 01/02/2020   Parotid mass 04/23/2018   Parotid abscess 02/21/2018   Primary osteoarthritis of left knee 09/15/2017   Degenerative arthritis of left knee 09/12/2017   Preop cardiovascular exam 07/23/2013   Weight loss, non-intentional 07/03/2013   Aortic stenosis 05/21/2013   Occlusion and stenosis of carotid artery  without mention of cerebral infarction 06/15/2012   Hypertension    Hyperlipidemia    GERD (gastroesophageal reflux disease)    Diverticulitis    Chronic pain    Lumbago    Heart murmur    Panic attacks    Stroke [facility])    Allergic rhinitis, seasonal    Stenosis of right carotid artery 11/18/2011    ONSET DATE: 01/16/2023 at 3am  REFERRING DIAG: G45.9 (ICD-10-CM) - TIA (transient ischemic attack) R26.9 (ICD-10-CM) - Gait abnormality  THERAPY DIAG:  Other abnormalities of gait and mobility  Muscle weakness (generalized)  Unsteadiness on feet  Rationale for Evaluation and Treatment: Rehabilitation  SUBJECTIVE:                                                                                                                                                                                             SUBJECTIVE STATEMENT: Patient states her BP has been intermittently elevated in the 200s systolically.  She states he MD is aware and told her she has an ongoing infection.  She reports being a "couch potato" for the passed 3 days because she "just didn't feel right".  She was told by another MD to wear heel lift in right shoe to improve containment of incontinence in pad by leveling out pelvis. Pt accompanied by: self - she drives herself  PERTINENT HISTORY: Scoliosis, L TKA, peripheral neuropathy, HLD, HTN, Bilateral Carotid stenosis (R worse), GERD, aortic stenosis, CKD stage 3, osteoporosis, depression, DM2 (diet controlled per patient)  Patient had sudden onset of left sided paresthesia and weakness with notable facial droop in early morning hours of January 16, 2023.  She did not seek emergency attention, but scheduled a doctor's appt to follow-up.  PAIN:  Are you having pain? No  PRECAUTIONS: Fall; pt wears glasses to drive and read, hears best on left side; bilateral carotid stenosis (  R worse than L)  RED FLAGS: None   WEIGHT BEARING RESTRICTIONS: No  FALLS: Has patient fallen in  last 6 months? No - several near misses as she does not use the cane in her house due to clutter/furniture  LIVING ENVIRONMENT: Lives with: lives alone Lives in: House/apartment Stairs: Yes: Internal: 15 steps; can reach both and into basement area where washer/dryer are and External: 2 steps; on right going up Has following equipment at home: Single point cane, Walker - 2 wheeled, Tour manager, and Grab bars  PLOF: Independent and Independent with community mobility with device - getting off low surfaces like couch are hard  PATIENT GOALS: "To maintain my independence!"  OBJECTIVE:   DIAGNOSTIC FINDINGS:  Brain MRI 02/18/2023 IMPRESSION: MRI scan of the brain without contrast showing mild age-related changes of chronic small vessel disease.  No acute abnormalities noted.  None from time of TIA accessible to PT.  COGNITION: Overall cognitive status: Within functional limits for tasks assessed   SENSATION: Light touch: WFL and more tingling on LLE  COORDINATION: LE RAMS:  WNL BLE Heel-to-shin:  WNL  EDEMA:  Baseline ankle edema bilaterally, not wearing compression stockings, medically managed  MUSCLE TONE: None noted in BLE  POSTURE: rounded shoulders, forward head, increased thoracic kyphosis, and left weight shift from left scoliosis  LOWER EXTREMITY ROM:     Active  Right Eval Left Eval  Hip flexion Grossly WNL  Hip extension   Hip abduction   Hip adduction   Hip internal rotation   Hip external rotation   Knee flexion   Knee extension   Ankle dorsiflexion   Ankle plantarflexion    Ankle inversion    Ankle eversion     (Blank rows = not tested)  LOWER EXTREMITY MMT:    MMT Right Eval Left Eval  Hip flexion 3+/5 3/5  Hip extension    Hip abduction    Hip adduction    Hip internal rotation    Hip external rotation    Knee flexion    Knee extension 4-/5 3+/5  Ankle dorsiflexion 4/5 4/5  Ankle plantarflexion    Ankle inversion    Ankle eversion     (Blank rows = not tested)  BED MOBILITY:  Sit to supine Complete Independence Supine to sit Complete Independence Rolling to Right Complete Independence Rolling to Left Complete Independence  TRANSFERS: Assistive device utilized: Single point cane  Sit to stand: Modified independence Stand to sit: Modified independence Chair to chair: SBA  GAIT: Gait pattern: decreased arm swing- Left, decreased step length- Left, decreased stance time- Left, decreased stride length, knee flexed in stance- Left, trunk flexed, and narrow BOS Distance walked: various clinic distances Assistive device utilized: Single point cane Level of assistance: SBA  FUNCTIONAL TESTS:  5 times sit to stand: 12.22 seconds BUE support Timed up and go (TUG): 16.07 seconds w/ SPC SBA 6 minute walk test: To be assessed. 10 meter walk test: To be assessed.  PATIENT SURVEYS:  ABC scale To be assessed.  TODAY'S TREATMENT:  DATE: 04/11/2023 LUE in sitting prior to session: Vitals:   04/11/23 1457  BP: (!) 161/75  Pulse: 69  -Wrote BP down for pt log  Discussed discharge vs re-cert w/ pt desiring discharge next session.  She states she would like to spend more time addressing her incontinence with her doctor.  PT recommended discussion of pelvic floor PT with referring MD - pt states she has and that MD not recommending.  PT provides education on pelvic floor purpose and some things they can address in partnership with MD POC, but option to pursue is up to patient.  Reviewed chair yoga handout: -Breathing x10 -Cat/cow x10 -Circles (some trunk compensation) x10 CW and x10 CCW -Sun [place]utation w/ twists x10 each side -[place] side leans x10 each side -Assisted neck stretches x10 each side w/ 2 sec hold -Ankle to knee hold vs slow rocking x 10 each LE -Goddess w/ a twist in reduced ROM x10  each side -Reduced ROM forward fold w/ cues for paced breathing and slow return to upright x10  PATIENT EDUCATION: Education details: Continue HEP w/ chair yoga as supplement on more fatigued days. Person educated: Patient Education method: Explanation Education comprehension: verbalized understanding  HOME EXERCISE PROGRAM: Access Code: [email or web address] URL: [email or web address] Date: 03/28/2023 Prepared by: Camille Bal  Exercises - Sit to Stand with Armchair  - 1 x daily - 7 x weekly - 2-3 sets - 10 reps - Supine Bridge  - 1 x daily - 4 x weekly - 1-2 sets - 10 reps - Seated March  - 1 x daily - 4-5 x weekly - 2 sets - 20 reps - Seated Hamstring Curls with Resistance  - 1 x daily - 4-5 x weekly - 2 sets - 15 reps - Seated Knee Extension with Resistance  - 1 x daily - 4-5 x weekly - 2 sets - 10 reps - Seated Scapular Retraction  - 1 x daily - 4-5 x weekly - 2 sets - 10 reps - Scapular retraction with resistance  - 1 x daily - 4-5 x weekly - 1-2 sets - 10 reps  GOALS: Goals reviewed with patient? Yes  SHORT TERM GOALS: Target date: 03/24/2023  Pt will be independent and compliant with initial strength and balance HEP in order to maintain functional progress and improve mobility. Baseline: Established, pt reports independence and recent compliance (9/10) Goal status: MET  2.  Pt will decrease 5xSTS to </=12 seconds w/o UE support in order to demonstrate decreased risk for falls and improved functional bilateral LE strength and power. Baseline: 12.22 sec w/ BUE support; 12.16 seconds RUE support (9/10) Goal status: IN PROGRESS  3.  Pt will demonstrate TUG of </=13 seconds in order to decrease risk of falls and improve functional mobility using LRAD. Baseline: 16.07 seconds SBA w/ SPC; 19.22 seconds w/ SPC SBA (9/10) Goal status: NOT MET  4.  Pt will demonstrate a gait speed of >/=1.90 feet/sec in order to decrease risk for falls. Baseline: 1.70 ft/sec (8/23); 2.09  ft/sec (9/10) Goal status: MET  LONG TERM GOALS: Target date: 04/21/2023  Pt will be independent and compliant with advanced strength and balance HEP in order to maintain functional progress and improve mobility. Baseline: To be established. Goal status: INITIAL  2.  Pt will ambulate >/=565 feet on to demonstrate improved functional endurance for home and community participation. Baseline:  365 feet w/ SPC and SBA and 2 seated rest breaks (8/23) Goal status: INITIAL  3.  Pt  will demonstrate a gait speed of >/=2.30 feet/sec in order to decrease risk for falls. Baseline: 1.70 ft/sec (8/23); 2.09 ft/sec (9/10) Goal status: REVISED  4.  Patient will improve ABC scale score to >/=80% to demonstrate decreased fear of falling and improved confidence in dynamic stability. Baseline: 71.25% Goal status: INITIAL  ASSESSMENT:  CLINICAL IMPRESSION: Introduced chair yoga today to supplement patient's baseline activity and HEP.  She is lightly familiar with chair yoga with PT providing additional handout for pt to add to a downloaded program she found.  She is in agreement to assess LTGs next session with plan for discharge.  She is hoping to focus on ongoing bladder troubles and may consider referral to pelvic floor PT to further address this concern.  OBJECTIVE IMPAIRMENTS: Abnormal gait, decreased activity tolerance, decreased balance, decreased endurance, decreased knowledge of condition, difficulty walking, decreased strength, increased edema, impaired flexibility, impaired sensation, improper body mechanics, postural dysfunction, and pain.   ACTIVITY LIMITATIONS: carrying, lifting, bending, standing, squatting, stairs, transfers, reach over head, and locomotion level  PARTICIPATION LIMITATIONS: meal prep, cleaning, laundry, shopping, community activity, and yard work  PERSONAL FACTORS: Age, Fitness, Past/current experiences, and 3+ comorbidities: left TKA, HTN, DM2/peripheral neuropathy   are also affecting patient's functional outcome.   REHAB POTENTIAL: Good  CLINICAL DECISION MAKING: Evolving/moderate complexity  EVALUATION COMPLEXITY: Moderate  PLAN:  PT FREQUENCY: 1x/week  PT DURATION: 8 weeks  PLANNED INTERVENTIONS: Therapeutic exercises, Therapeutic activity, Neuromuscular re-education, Balance training, Gait training, Patient/Family education, Self Care, Stair training, Vestibular training, Orthotic/Fit training, DME instructions, Manual therapy, and Re-evaluation  PLAN FOR NEXT SESSION:  ASSESS LTGs - D/C!  Sadie Haber, PT, DPT 04/11/2023, 3:29 PM

## 2023-04-13 DIAGNOSIS — H401131 Primary open-angle glaucoma, bilateral, mild stage: Secondary | ICD-10-CM | POA: Diagnosis not present

## 2023-04-18 ENCOUNTER — Encounter: Payer: Self-pay | Admitting: Physical Therapy

## 2023-04-18 ENCOUNTER — Ambulatory Visit: Payer: Medicare Other | Admitting: Physical Therapy

## 2023-04-18 VITALS — BP 193/75 | HR 68

## 2023-04-18 DIAGNOSIS — R2681 Unsteadiness on feet: Secondary | ICD-10-CM | POA: Diagnosis not present

## 2023-04-18 DIAGNOSIS — M6281 Muscle weakness (generalized): Secondary | ICD-10-CM

## 2023-04-18 DIAGNOSIS — R2689 Other abnormalities of gait and mobility: Secondary | ICD-10-CM | POA: Diagnosis not present

## 2023-04-18 NOTE — Therapy (Signed)
OUTPATIENT PHYSICAL THERAPY NEURO TREATMENT - DISCHARGE SUMMARY   Patient Name: Sophia Suarez MRN: [id number] DOB:1941-06-16, 82 y.o., female Today's Date: 04/18/2023   PCP: Mila Palmer, MD REFERRING PROVIDER: Levert Feinstein, MD  PHYSICAL THERAPY DISCHARGE SUMMARY  Visits from Start of Care: 8  Current functional level related to goals / functional outcomes: See clinical impression statement.   Remaining deficits: Chronic postural changes, reliance on [facility] for safest ambulation   Education / Equipment: Continue HEP w/ chair yoga as supplement on more fatigued days.  Routine walking using cane for safety 5 days per week.  Discussed ongoing BP monitoring and episodic PT in the future if desired.  Discussed progress towards goals as noted today.   Patient agrees to discharge. Patient goals were partially met. Patient is being discharged due to financial reasons.   END OF SESSION:  PT End of Session - 04/18/23 1449     Visit Number 8    Number of Visits 9   8 + eval   Date for PT Re-Evaluation 05/05/23   pushed out due to scheduling delay   Authorization Type [employer] MEDICARE    Progress Note Due on Visit 10    PT Start Time 1449    PT Stop Time 1527    PT Time Calculation (min) 38 min    Equipment Utilized During Treatment Gait belt    Activity Tolerance Patient tolerated treatment well    Behavior During Therapy WFL for tasks assessed/performed             Past Medical History:  Diagnosis Date   Allergic rhinitis, seasonal    Anemia    Arthritis    Asthma    Carotid artery occlusion    Chronic kidney disease    Chronic pain    Complication of anesthesia    makes her hyper   Diabetes mellitus    no medication  diet controlled   Diverticulitis    Dyspnea    with exertion   GERD (gastroesophageal reflux disease)    Heart murmur    Hyperlipidemia    Hypertension    Lumbago    Panic attacks    Stroke [facility])    Urinary incontinence    Past Surgical  History:  Procedure Laterality Date   ABDOMINAL HYSTERECTOMY  1973   Partial   APPENDECTOMY  1958   BIOPSY THYROID  Dec 05, 2013   Tumor- Benign   COLECTOMY  2003   extent uncertain   EYE SURGERY Right October 10, 2014   Cataract   EYE SURGERY Left  October 17, 2014   Cataract   KNEE ARTHROSCOPY Left    MASS EXCISION Right 04/23/2018   Procedure: EXCISION OF SKIN MASS WITH PRIMARY CLOSURE;  Surgeon: Serena Colonel, MD;  Location: [facility];  Service: ENT;  Laterality: Right;   OVARIAN CYST SURGERY     PAROTIDECTOMY Right 04/23/2018   Procedure: Superficial Parotidectomy with nerve dissection;  Surgeon: Serena Colonel, MD;  Location: [facility];  Service: ENT;  Laterality: Right;   PARTIAL HYSTERECTOMY     TOTAL KNEE ARTHROPLASTY Left 09/15/2017   Procedure: TOTAL KNEE ARTHROPLASTY;  Surgeon: Gean Birchwood, MD;  Location: [facility] OR;  Service: Orthopedics;  Laterality: Left;   Patient Active Problem List   Diagnosis Date Noted   TIA (transient ischemic attack) 01/26/2023   Gait abnormality 01/26/2023   Age related osteoporosis 08/13/2021   Decreased estrogen level 08/13/2021   Recurrent major depression (HCC)  08/13/2021   Senile purpura (HCC) 08/13/2021   Acute on chronic renal failure (HCC) 10/13/2020   Aortic valve disorder 10/13/2020   Atrophy of vagina 10/13/2020   Body mass index (BMI) 32.0-32.9, adult 10/13/2020   Chronic kidney disease, stage 3 unspecified (HCC) 10/13/2020   Diabetic renal disease (HCC) 10/13/2020   Constipation 10/13/2020   Disorder of arteries and arterioles, unspecified (HCC) 10/13/2020   Dysphagia 10/13/2020   Dyssomnia 10/13/2020   Essential tremor 10/13/2020   Flatulence 10/13/2020   History of hysterectomy 10/13/2020   Lichen sclerosus 10/13/2020   Major depression in partial remission (HCC) 10/13/2020   Mild intermittent asthma 10/13/2020   Mild recurrent major depression (HCC) 10/13/2020   Morbid obesity (HCC) 10/13/2020    Nausea and vomiting 10/13/2020   Nontoxic single thyroid nodule 10/13/2020   Overactive bladder 10/13/2020   Primary open angle glaucoma (POAG) of both eyes, mild stage 10/13/2020   Stress incontinence (female) (female) 10/13/2020   Vitamin D deficiency 10/13/2020   Mixed incontinence 07/29/2020   Rhinorrhea 02/04/2020   Infection of skin of neck 01/02/2020   Parotid mass 04/23/2018   Parotid abscess 02/21/2018   Primary osteoarthritis of left knee 09/15/2017   Degenerative arthritis of left knee 09/12/2017   Preop cardiovascular exam 07/23/2013   Weight loss, non-intentional 07/03/2013   Aortic stenosis 05/21/2013   Occlusion and stenosis of carotid artery without mention of cerebral infarction 06/15/2012   Hypertension    Hyperlipidemia    GERD (gastroesophageal reflux disease)    Diverticulitis    Chronic pain    Lumbago    Heart murmur    Panic attacks    Stroke [facility])    Allergic rhinitis, seasonal    Stenosis of right carotid artery 11/18/2011    ONSET DATE: 01/16/2023 at 3am  REFERRING DIAG: G45.9 (ICD-10-CM) - TIA (transient ischemic attack) R26.9 (ICD-10-CM) - Gait abnormality  THERAPY DIAG:  Other abnormalities of gait and mobility  Muscle weakness (generalized)  Unsteadiness on feet  Rationale for Evaluation and Treatment: Rehabilitation  SUBJECTIVE:                                                                                                                                                                                             SUBJECTIVE STATEMENT: Patient states she still wishes to discharge as her copay is increasing and she cannot afford that.  Her BP continues to fluctuate. Pt accompanied by: self - she drives herself  PERTINENT HISTORY: Scoliosis, L TKA, peripheral neuropathy, HLD, HTN, Bilateral Carotid stenosis (R worse), GERD, aortic stenosis, CKD stage 3, osteoporosis, depression, DM2 (diet controlled per patient)  Patient had sudden onset of  left sided paresthesia and weakness with notable facial droop in early morning hours of January 16, 2023.  She did not seek emergency attention, but scheduled a doctor's appt to follow-up.  PAIN:  Are you having pain? No  PRECAUTIONS: Fall; pt wears glasses to drive and read, hears best on left side; bilateral carotid stenosis (R worse than L)  RED FLAGS: None   WEIGHT BEARING RESTRICTIONS: No  FALLS: Has patient fallen in last 6 months? No - several near misses as she does not use the cane in her house due to clutter/furniture  LIVING ENVIRONMENT: Lives with: lives alone Lives in: House/apartment Stairs: Yes: Internal: 15 steps; can reach both and into basement area where washer/dryer are and External: 2 steps; on right going up Has following equipment at home: Single point cane, Walker - 2 wheeled, Tour manager, and Grab bars  PLOF: Independent and Independent with community mobility with device - getting off low surfaces like couch are hard  PATIENT GOALS: "To maintain my independence!"  OBJECTIVE:   DIAGNOSTIC FINDINGS:  Brain MRI 02/18/2023 IMPRESSION: MRI scan of the brain without contrast showing mild age-related changes of chronic small vessel disease.  No acute abnormalities noted.  None from time of TIA accessible to PT.  COGNITION: Overall cognitive status: Within functional limits for tasks assessed   SENSATION: Light touch: WFL and more tingling on LLE  COORDINATION: LE RAMS:  WNL BLE Heel-to-shin:  WNL  EDEMA:  Baseline ankle edema bilaterally, not wearing compression stockings, medically managed  MUSCLE TONE: None noted in BLE  POSTURE: rounded shoulders, forward head, increased thoracic kyphosis, and left weight shift from left scoliosis  LOWER EXTREMITY ROM:     Active  Right Eval Left Eval  Hip flexion Grossly WNL  Hip extension   Hip abduction   Hip adduction   Hip internal rotation   Hip external rotation   Knee flexion   Knee extension    Ankle dorsiflexion   Ankle plantarflexion    Ankle inversion    Ankle eversion     (Blank rows = not tested)  LOWER EXTREMITY MMT:    MMT Right Eval Left Eval  Hip flexion 3+/5 3/5  Hip extension    Hip abduction    Hip adduction    Hip internal rotation    Hip external rotation    Knee flexion    Knee extension 4-/5 3+/5  Ankle dorsiflexion 4/5 4/5  Ankle plantarflexion    Ankle inversion    Ankle eversion    (Blank rows = not tested)  BED MOBILITY:  Sit to supine Complete Independence Supine to sit Complete Independence Rolling to Right Complete Independence Rolling to Left Complete Independence  TRANSFERS: Assistive device utilized: Single point cane  Sit to stand: Modified independence Stand to sit: Modified independence Chair to chair: SBA  GAIT: Gait pattern: decreased arm swing- Left, decreased step length- Left, decreased stance time- Left, decreased stride length, knee flexed in stance- Left, trunk flexed, and narrow BOS Distance walked: various clinic distances Assistive device utilized: Single point cane Level of assistance: SBA  FUNCTIONAL TESTS:  5 times sit to stand: 12.22 seconds BUE support Timed up and go (TUG): 16.07 seconds w/ SPC SBA 6 minute walk test: To be assessed. 10 meter walk test: To be assessed.  PATIENT SURVEYS:  ABC scale To be assessed.  TODAY'S TREATMENT:  DATE: 04/18/2023 LUE in sitting prior to session: Vitals:   04/18/23 1458  BP: (!) 193/75  Pulse: 68   -Wrote BP down for pt log -Verbally reviewed HEP - pt states she has been doing some in bed then repeating sitting in the chair.  She has current printout. -ABC Scale:  72.5% - :  14.68 sec = 0.68 m/sec OR 2.25 ft/sec w/ SPC mod I - :  31' w/ SPC SBA-CGA due to fatigue, continuous  PATIENT EDUCATION: Education details: Continue HEP w/  chair yoga as supplement on more fatigued days.  Routine walking using cane for safety 5 days per week.  Discussed ongoing BP monitoring and episodic PT in the future if desired.  Discussed progress towards goals as noted today.  Urged her to contact PCP regarding red bite appearing closed wound on lateral right LE to stay ahead of potential infection. Person educated: Patient Education method: Explanation Education comprehension: verbalized understanding  HOME EXERCISE PROGRAM: Access Code: [email or web address] URL: [email or web address] Date: 03/28/2023 Prepared by: Camille Bal  Exercises - Sit to Stand with Armchair  - 1 x daily - 7 x weekly - 2-3 sets - 10 reps - Supine Bridge  - 1 x daily - 4 x weekly - 1-2 sets - 10 reps - Seated March  - 1 x daily - 4-5 x weekly - 2 sets - 20 reps - Seated Hamstring Curls with Resistance  - 1 x daily - 4-5 x weekly - 2 sets - 15 reps - Seated Knee Extension with Resistance  - 1 x daily - 4-5 x weekly - 2 sets - 10 reps - Seated Scapular Retraction  - 1 x daily - 4-5 x weekly - 2 sets - 10 reps - Scapular retraction with resistance  - 1 x daily - 4-5 x weekly - 1-2 sets - 10 reps  GOALS: Goals reviewed with patient? Yes  SHORT TERM GOALS: Target date: 03/24/2023  Pt will be independent and compliant with initial strength and balance HEP in order to maintain functional progress and improve mobility. Baseline: Established, pt reports independence and recent compliance (9/10) Goal status: MET  2.  Pt will decrease 5xSTS to </=12 seconds w/o UE support in order to demonstrate decreased risk for falls and improved functional bilateral LE strength and power. Baseline: 12.22 sec w/ BUE support; 12.16 seconds RUE support (9/10) Goal status: IN PROGRESS  3.  Pt will demonstrate TUG of </=13 seconds in order to decrease risk of falls and improve functional mobility using LRAD. Baseline: 16.07 seconds SBA w/ SPC; 19.22 seconds w/ SPC SBA  (9/10) Goal status: NOT MET  4.  Pt will demonstrate a gait speed of >/=1.90 feet/sec in order to decrease risk for falls. Baseline: 1.70 ft/sec (8/23); 2.09 ft/sec (9/10) Goal status: MET  LONG TERM GOALS: Target date: 04/21/2023  Pt will be independent and compliant with advanced strength and balance HEP in order to maintain functional progress and improve mobility. Baseline: Pt reports IND and compliance (10/8) Goal status: MET  2.  Pt will ambulate >/=565 feet on to demonstrate improved functional endurance for home and community participation. Baseline:  365 feet w/ SPC and SBA and 2 seated rest breaks (8/23); 593' w/ SPC continuously SBA-CGA due to fatigue (10/8) Goal status: MET  3.  Pt will demonstrate a gait speed of >/=2.30 feet/sec in order to decrease risk for falls. Baseline: 1.70 ft/sec (8/23); 2.09 ft/sec (9/10); 2.25 ft/sec w/ SPC mod I (10/8) Goal status: PARTIALLY MET  4.  Patient will improve ABC scale score to >/=80% to demonstrate decreased fear of falling and improved confidence in dynamic stability. Baseline: 71.25%; 72.5% (10/8) Goal status:  NOT MET  ASSESSMENT:  CLINICAL IMPRESSION: Assessed LTGs this visit with patient meeting 2 of 4 goals.  She has greatly improved her walking tolerance to 593 feet this assessment without need for rest breaks though her fatigue greatly impacts her walking mechanics.  She is being consistently compliant with her HEP and was encouraged to include regular walking to her routine.  Her walking speed did significantly improve to 2.25 ft/sec even though it just missed the goal level as written.  Her ABC scale score did not improve despite pt reports of feeling stronger and steadier.  She would like to discharge this visit due to financial concerns and plan to continue physical activity with her close friend group.  She was educated on obtaining referral to return to PT and is appropriate and in agreement to discharge  today.  OBJECTIVE IMPAIRMENTS: Abnormal gait, decreased activity tolerance, decreased balance, decreased endurance, decreased knowledge of condition, difficulty walking, decreased strength, increased edema, impaired flexibility, impaired sensation, improper body mechanics, postural dysfunction, and pain.   ACTIVITY LIMITATIONS: carrying, lifting, bending, standing, squatting, stairs, transfers, reach over head, and locomotion level  PARTICIPATION LIMITATIONS: meal prep, cleaning, laundry, shopping, community activity, and yard work  PERSONAL FACTORS: Age, Fitness, Past/current experiences, and 3+ comorbidities: left TKA, HTN, DM2/peripheral neuropathy  are also affecting patient's functional outcome.   REHAB POTENTIAL: Good  CLINICAL DECISION MAKING: Evolving/moderate complexity  EVALUATION COMPLEXITY: Moderate  PLAN:  PT FREQUENCY: 1x/week  PT DURATION: 8 weeks  PLANNED INTERVENTIONS: Therapeutic exercises, Therapeutic activity, Neuromuscular re-education, Balance training, Gait training, Patient/Family education, Self Care, Stair training, Vestibular training, Orthotic/Fit training, DME instructions, Manual therapy, and Re-evaluation  PLAN FOR NEXT SESSION:  N/A  Sadie Haber, PT, DPT 04/18/2023, 3:30 PM

## 2023-05-09 ENCOUNTER — Ambulatory Visit: Payer: Medicare Other | Admitting: Podiatry

## 2023-05-16 DIAGNOSIS — I951 Orthostatic hypotension: Secondary | ICD-10-CM | POA: Diagnosis not present

## 2023-05-31 DIAGNOSIS — I951 Orthostatic hypotension: Secondary | ICD-10-CM | POA: Diagnosis not present

## 2023-06-16 NOTE — Progress Notes (Unsigned)
Patient ID: Sophia Suarez, female   DOB: December 18, 1940, 82 y.o.   MRN: [id number]     82 y.o. first seen by me 5/14 . Has had TIAls with borderline carotid lesion. Sees Dr Marjory Lies as well. Reviewed CTA from 4/26 and RICA has a ? Of 55-60% stenosis not typically bad enough to operate on.   No history of CAD. Active with no chest pain. Long standing murmur.   Echo7/15/21 normal EF mild AS mean gradient 12 mmhg  Carotid 01/26/21 0-59% RICA Has been on chronic plavix per neurology Thyroid nodule noted   Had left TKR [facility] 2019 March 04/23/18 Right Parotid mass removal Rosen  Right nail avulsion hallux 11/27/19   Left > right LE edema Has had another mass/infection on right side of neck been on doxycycline and seeing DR Hall/Rosen  He dog Dia Sitter passed away has thought about getting another but wants her neck issues To be resolved Not getting along with daughter She wants her inheritance now Son very supportive  Seen by neuro July for left sided paresthesia and weakness Baseline walks with cane due to scoliosis and prior Left TKR ASA stopped just on Plavix now MRI brain negative  02/18/23 carotids 02/10/23 stable 40-59% RICA stenosis  ***  ROS: Denies fever, malais, weight loss, blurry vision, decreased visual acuity, cough, sputum, SOB, hemoptysis, pleuritic pain, palpitaitons, heartburn, abdominal pain, melena, lower extremity edema, claudication, or rash.  All other systems reviewed and negative  General: There were no vitals taken for this visit. Affect appropriate Healthy:  appears stated age HEENT: post right parotid excision with right neck bandage  Neck supple with no adenopathy JVP normal no bruits no thyromegaly Lungs clear with no wheezing and good diaphragmatic motion Heart:  S1/S2 mild AS  murmur, no rub, gallop or click PMI normal Abdomen: benighn, BS positve, no tenderness, no AAA no bruit.  No HSM or HJR Distal pulses intact with no bruits Plus one pedal edema Neuro  non-focal Skin warm and dry Post left TKR     Current Outpatient Medications  Medication Sig Dispense Refill   acetaminophen (ACETAMINOPHEN EXTRA STRENGTH) 500 MG tablet Take 1,000 mg by mouth 2 (two) times daily.     alendronate (FOSAMAX) 70 MG tablet Take 1 tablet by mouth once a week.     aspirin EC 81 MG tablet Take 81 mg by mouth daily. Swallow whole.     Biotin w/ Vitamins C & E (HAIR SKIN & NAILS GUMMIES) 1250-7.5-7.5 MCG-MG-UNT CHEW 2 gummies     Calcium Carb-Cholecalciferol 600-200 MG-UNIT TABS 2  tablets     clopidogrel (PLAVIX) 75 MG tablet Take 1 tablet by mouth daily.     Coenzyme Q10 (CO Q 10) 60 MG CAPS 1 capsule with a meal     diphenhydrAMINE (BENADRYL) 25 mg capsule 1 capsule as needed     docusate sodium (COLACE) 100 MG capsule Take 100 mg by mouth at bedtime as needed for mild constipation.     dorzolamide-timolol (COSOPT) 22.3-6.8 MG/ML ophthalmic solution      famotidine (PEPCID) 40 MG tablet Take 40 mg by mouth at bedtime.     Glucos-Chond-Hyal Ac-Ca Fructo (MOVE FREE JOINT HEALTH ADVANCE) TABS Take 2 tablets by mouth daily.     hydrochlorothiazide (HYDRODIURIL) 25 MG tablet TAKE 1 TABLET (25 MG TOTAL) BY MOUTH DAILY. 90 tablet 2   ipratropium (ATROVENT) 0.03 % nasal spray 2 sprays in each nostril     losartan (COZAAR) 100 MG tablet Take  1 tablet by mouth daily.     Multiple Vitamin (MULTIVITAMIN WITH MINERALS) TABS tablet Take 1 tablet by mouth daily. Centrum Silver     Omega-3 Fatty Acids (FISH OIL) 1000 MG CAPS Take 2,000 mg by mouth daily.     Polyethyl Glycol-Propyl Glycol (SYSTANE) 0.4-0.3 % SOLN Place 1 drop into both eyes as needed.     Probiotic Product (PROBIOTIC DAILY PO) Take 1 capsule by mouth daily.     simvastatin (ZOCOR) 40 MG tablet Take 1 tablet by mouth every evening.     valsartan-hydrochlorothiazide (DIOVAN-HCT) 160-12.5 MG tablet Take 1 tablet by mouth daily.     vitamin B-12 (CYANOCOBALAMIN) 1000 MCG tablet Take 1,000 mcg by mouth daily.      No current facility-administered medications for this visit.    Allergies  Clindamycin/lincomycin, Other, Septra [sulfamethoxazole-trimethoprim], Sulfa antibiotics, Melatonin, Bee pollen, Cheese, Codeine, Horse-derived products, Latex, Mold extract [trichophyton], and Pollen extract  Electrocardiogram:  06/16/2023 SR rate 74 poor R wave progression  Assessment and Plan AS: mild by TTE 2021 update 05/18/22 normal EF 60-65% mean gradient still only 10 mmhg DVI 0.43 and AVA 1.4 cm2    Carotid   40-59% RICA stenosis.02/10/23 F/U primary regarding thyroid nodule noted Consider f/u dedicated US of thyroid On plavix. MRI negative August 2024 ? TIA F/U with neuro   HTN: Better with diuretic  continue ARB  Discussed diet and weight loss   ENT: post right parotid mass removal F/U Pollyann Kennedy new lesion in right cervical area f/u Rosen/Hall Has finished course of doxycycline   Ortho:  Post left TKR f/u [facility]   Edema;  Dependant ? Component lymph edema increased HcTZ 25 mg daily   ***   F/U in a year   [employer]

## 2023-06-22 ENCOUNTER — Ambulatory Visit: Payer: Medicare Other | Attending: Cardiovascular Disease | Admitting: Cardiovascular Disease

## 2023-06-22 VITALS — BP 160/82 | HR 73 | Ht 62.0 in | Wt 133.8 lb

## 2023-06-22 DIAGNOSIS — G459 Transient cerebral ischemic attack, unspecified: Secondary | ICD-10-CM | POA: Diagnosis not present

## 2023-06-22 DIAGNOSIS — I350 Nonrheumatic aortic (valve) stenosis: Secondary | ICD-10-CM | POA: Diagnosis not present

## 2023-06-22 DIAGNOSIS — I10 Essential (primary) hypertension: Secondary | ICD-10-CM

## 2023-06-22 DIAGNOSIS — I6521 Occlusion and stenosis of right carotid artery: Secondary | ICD-10-CM | POA: Diagnosis not present

## 2023-06-22 NOTE — Patient Instructions (Signed)

## 2023-08-09 ENCOUNTER — Ambulatory Visit: Payer: Medicare HMO | Admitting: Adult Health

## 2023-09-15 IMAGING — MG MM DIGITAL SCREENING BILAT W/ TOMO AND CAD
6 of 10 series · 6 of 30 positions shown · non-contrast
Comparison: Previous exam(s).

CLINICAL DATA: Screening.

EXAM:
DIGITAL SCREENING BILATERAL MAMMOGRAM WITH TOMOSYNTHESIS AND CAD
TECHNIQUE: Bilateral screening digital craniocaudal and mediolateral oblique
mammograms were obtained. Bilateral screening digital breast
tomosynthesis was performed. The images were evaluated with
computer-aided detection.

[R CC synth-2D (1 of 2)]
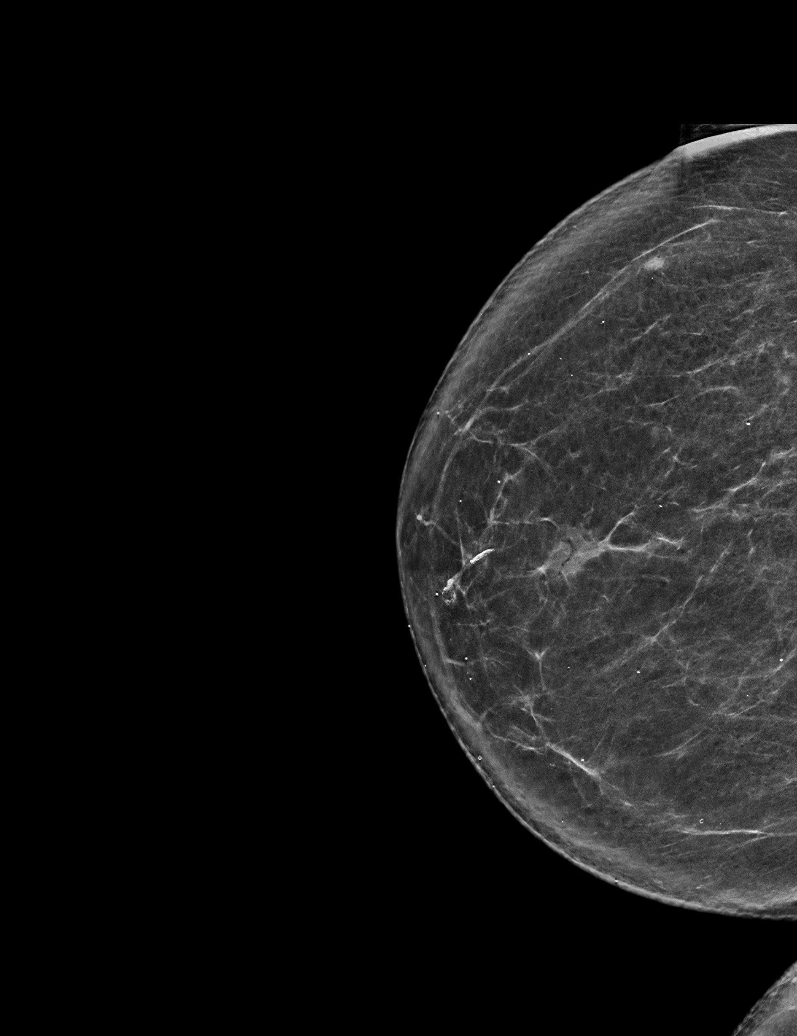

[L MLO synth-2D]
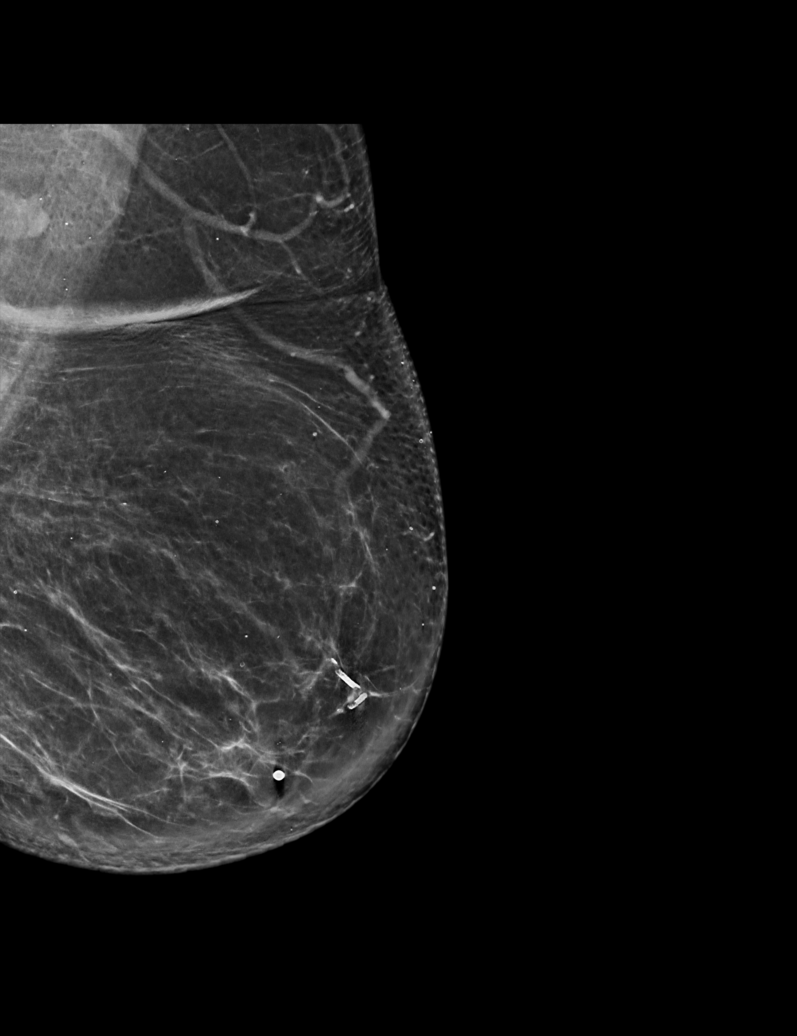

[R CC synth-2D (2 of 2)]
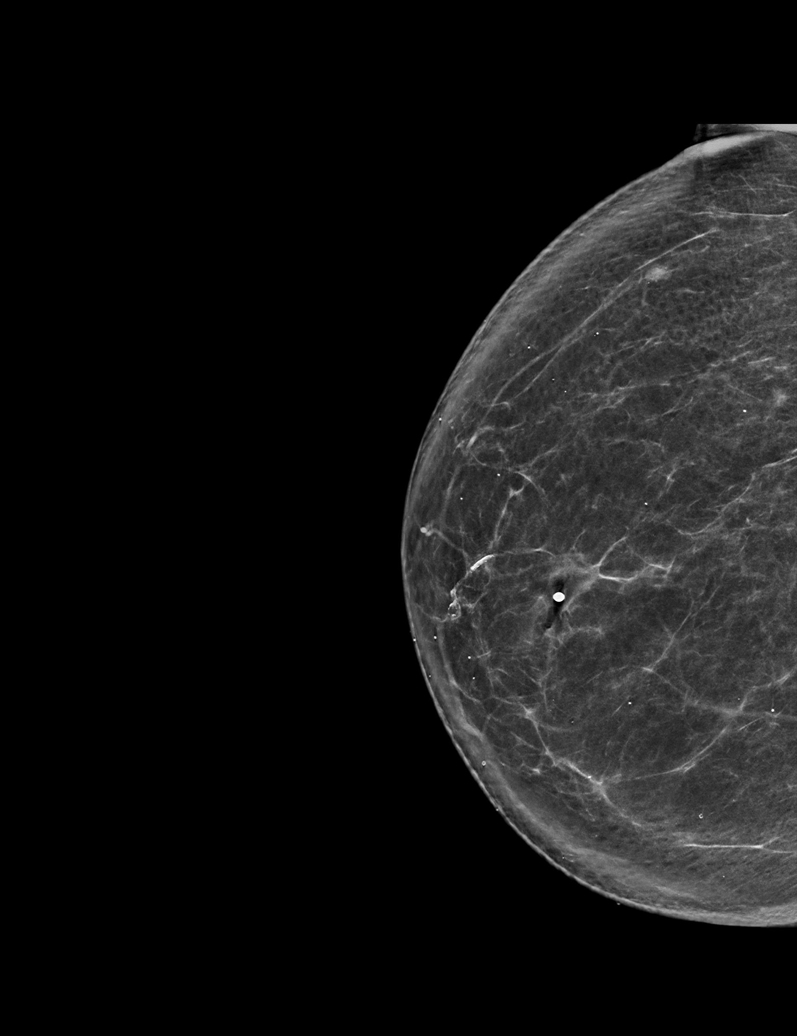

[L CC synth-2D]
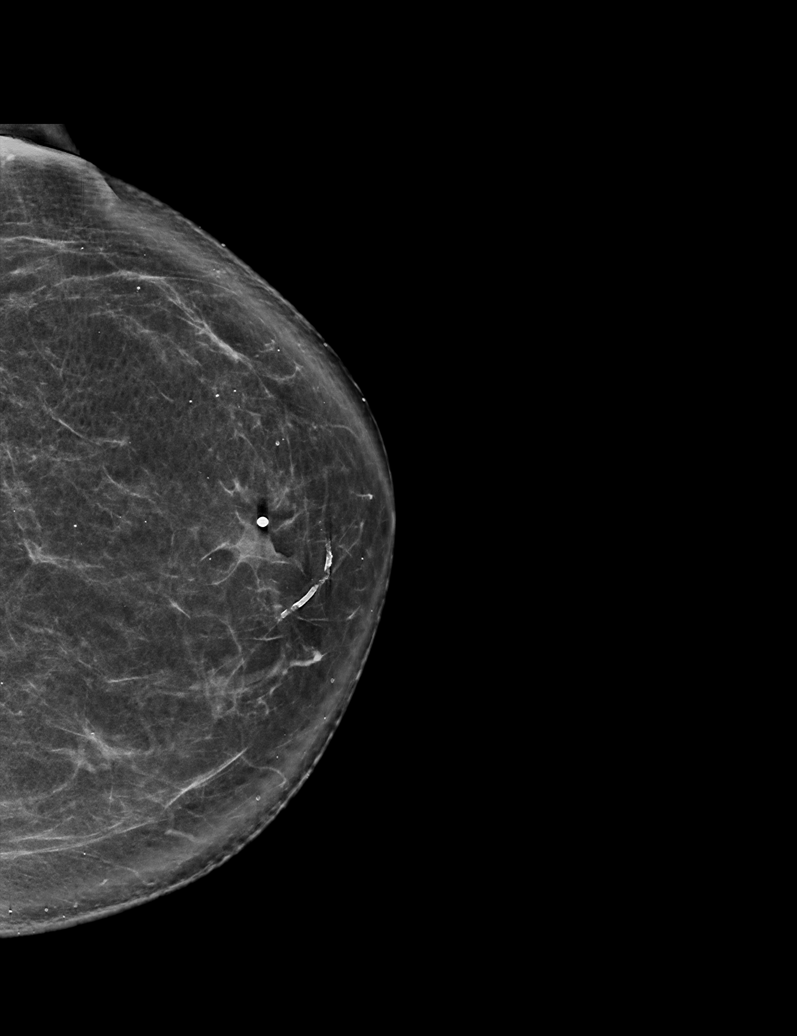

[R MLO synth-2D]
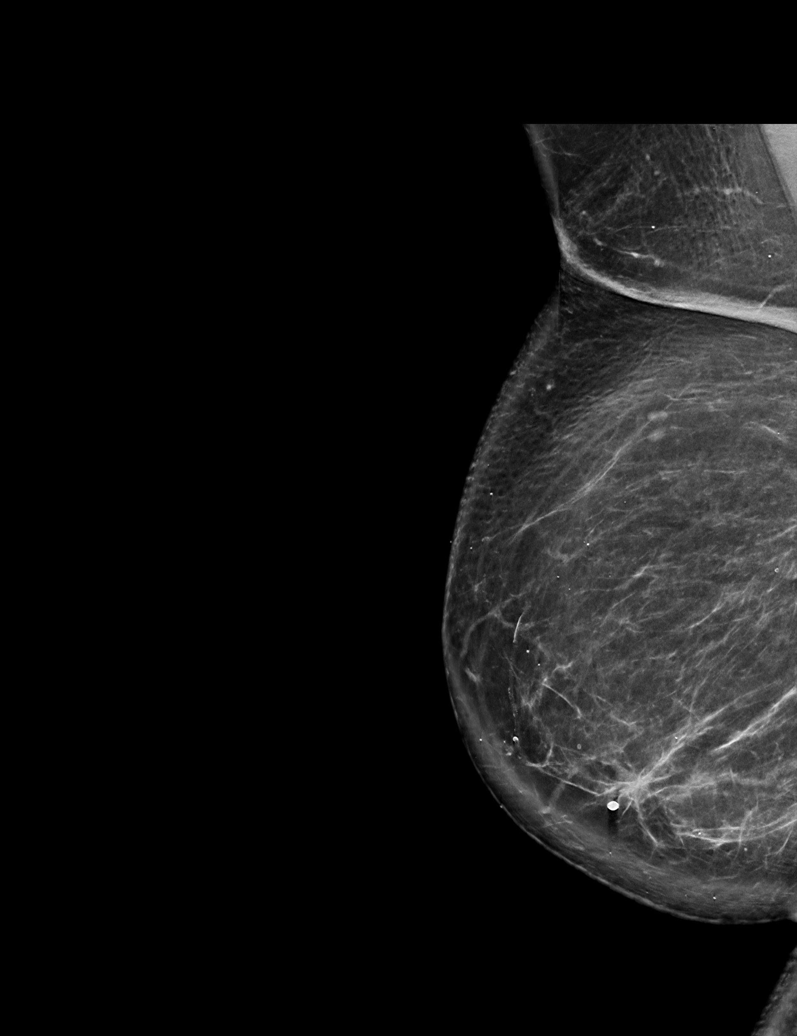

[R CC tomo · tomo slice 37/74.0]
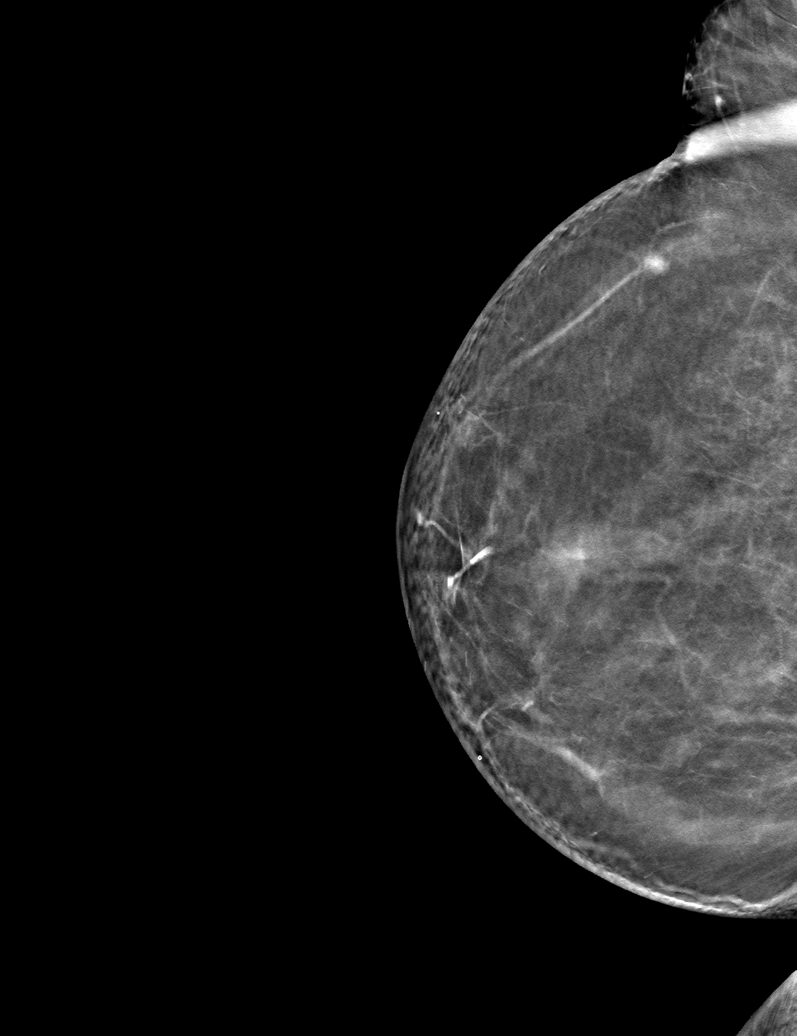

[6 of 30 positions shown; findings below may reference images not displayed]

ACR Breast Density Category b: There are scattered areas of
fibroglandular density.
FINDINGS: There are no findings suspicious for malignancy.
IMPRESSION: No mammographic evidence of malignancy. A result letter of this
screening mammogram will be mailed directly to the patient.

RECOMMENDATION:
Screening mammogram in one year. (Code:51-O-LD2)

BI-RADS CATEGORY  1: Negative.

## 2023-10-27 DIAGNOSIS — Z1159 Encounter for screening for other viral diseases: Secondary | ICD-10-CM | POA: Diagnosis not present

## 2023-10-27 DIAGNOSIS — E785 Hyperlipidemia, unspecified: Secondary | ICD-10-CM | POA: Diagnosis not present

## 2023-10-27 DIAGNOSIS — Z79899 Other long term (current) drug therapy: Secondary | ICD-10-CM | POA: Diagnosis not present

## 2023-10-27 DIAGNOSIS — Z136 Encounter for screening for cardiovascular disorders: Secondary | ICD-10-CM | POA: Diagnosis not present

## 2023-10-27 DIAGNOSIS — E559 Vitamin D deficiency, unspecified: Secondary | ICD-10-CM | POA: Diagnosis not present

## 2023-10-27 DIAGNOSIS — Z0189 Encounter for other specified special examinations: Secondary | ICD-10-CM | POA: Diagnosis not present

## 2023-10-27 DIAGNOSIS — E1122 Type 2 diabetes mellitus with diabetic chronic kidney disease: Secondary | ICD-10-CM | POA: Diagnosis not present

## 2023-10-27 DIAGNOSIS — M810 Age-related osteoporosis without current pathological fracture: Secondary | ICD-10-CM | POA: Diagnosis not present

## 2023-10-27 DIAGNOSIS — K219 Gastro-esophageal reflux disease without esophagitis: Secondary | ICD-10-CM | POA: Diagnosis not present

## 2023-10-27 DIAGNOSIS — I10 Essential (primary) hypertension: Secondary | ICD-10-CM | POA: Diagnosis not present

## 2023-10-30 ENCOUNTER — Other Ambulatory Visit (HOSPITAL_COMMUNITY): Payer: Self-pay | Admitting: Nurse Practitioner

## 2023-10-30 DIAGNOSIS — R042 Hemoptysis: Secondary | ICD-10-CM

## 2023-11-10 ENCOUNTER — Ambulatory Visit
Admission: RE | Admit: 2023-11-10 | Discharge: 2023-11-10 | Disposition: A | Discharge status: Home / Self Care | Source: Ambulatory Visit | Attending: Nurse Practitioner | Admitting: Nurse Practitioner

## 2023-11-10 ENCOUNTER — Other Ambulatory Visit: Payer: Self-pay | Admitting: Nurse Practitioner

## 2023-11-10 DIAGNOSIS — R042 Hemoptysis: Secondary | ICD-10-CM | POA: Diagnosis not present

## 2023-11-21 DIAGNOSIS — Z0001 Encounter for general adult medical examination with abnormal findings: Secondary | ICD-10-CM | POA: Diagnosis not present

## 2023-11-21 DIAGNOSIS — K219 Gastro-esophageal reflux disease without esophagitis: Secondary | ICD-10-CM | POA: Diagnosis not present

## 2023-11-21 DIAGNOSIS — R42 Dizziness and giddiness: Secondary | ICD-10-CM | POA: Diagnosis not present

## 2023-11-21 DIAGNOSIS — Z7189 Other specified counseling: Secondary | ICD-10-CM | POA: Diagnosis not present

## 2023-11-21 DIAGNOSIS — E1122 Type 2 diabetes mellitus with diabetic chronic kidney disease: Secondary | ICD-10-CM | POA: Diagnosis not present

## 2023-11-21 DIAGNOSIS — I509 Heart failure, unspecified: Secondary | ICD-10-CM | POA: Diagnosis not present

## 2023-11-21 DIAGNOSIS — I10 Essential (primary) hypertension: Secondary | ICD-10-CM | POA: Diagnosis not present

## 2023-11-21 DIAGNOSIS — N183 Chronic kidney disease, stage 3 unspecified: Secondary | ICD-10-CM | POA: Diagnosis not present

## 2024-04-24 ENCOUNTER — Other Ambulatory Visit (HOSPITAL_BASED_OUTPATIENT_CLINIC_OR_DEPARTMENT_OTHER): Payer: Self-pay | Admitting: Student

## 2024-04-24 DIAGNOSIS — M810 Age-related osteoporosis without current pathological fracture: Secondary | ICD-10-CM

## 2024-04-26 ENCOUNTER — Ambulatory Visit: Payer: Self-pay

## 2024-04-26 NOTE — Telephone Encounter (Signed)
 FYI Only or Action Required?: FYI only for provider.  Called Nurse Triage reporting Dizziness.  Symptoms began several weeks ago.  Interventions attempted: Prescription medications: meclizine and Rest, hydration, or home remedies.  Symptoms are: gradually worsening.  Triage Disposition: See Physician Within 24 Hours  Patient/caregiver understands and will follow disposition?: Yes  Copied from CRM #[id number]. Topic: Clinical - Red Word Triage >> Apr 26, 2024 12:56 PM Drema MATSU wrote: Red Word that prompted transfer to Nurse Triage: Patient is dizzy, throwing up, and thinks she has covid. Reason for Disposition  [1] MODERATE dizziness (e.g., vertigo; feels very unsteady, interferes with normal activities) AND [2] has NOT been evaluated by doctor (or NP/PA) for this  Answer Assessment - Initial Assessment Questions 1. DESCRIPTION: Describe your dizziness.     Patient describes a spinning sensation 2. VERTIGO: Do you feel like either you or the room is spinning or tilting?      yes 3. LIGHTHEADED: Do you feel lightheaded? (e.g., somewhat faint, woozy, weak upon standing)     no 4. SEVERITY: How bad is it?  Can you walk?     Moderate dizziness 5. ONSET:  When did the dizziness begin?     Started 10 AM this morning but has been having dizziness for three months 6. AGGRAVATING FACTORS: Does anything make it worse? (e.g., standing, change in head position)     More with movement 7. CAUSE: What do you think is causing the dizziness?     Unsure of what could be causing this 8. RECURRENT SYMPTOM: Have you had dizziness before? If Yes, ask: When was the last time? What happened that time?     yes 9. OTHER SYMPTOMS: Do you have any other symptoms? (e.g., earache, headache, numbness, tinnitus, vomiting, weakness)     Vomiting, runny nose  Protocols used: Dizziness - Vertigo-A-AH

## 2024-04-26 NOTE — Therapy (Incomplete)
 OUTPATIENT PHYSICAL THERAPY VESTIBULAR EVALUATION     Patient Name: Sophia Suarez MRN: [id number] DOB:1940-07-17, 83 y.o., female Today's Date: 04/26/2024  END OF SESSION:   Past Medical History:  Diagnosis Date   Allergic rhinitis, seasonal    Anemia    Arthritis    Asthma    Carotid artery occlusion    Chronic kidney disease    Chronic pain    Complication of anesthesia    makes her hyper   Diabetes mellitus    no medication  diet controlled   Diverticulitis    Dyspnea    with exertion   GERD (gastroesophageal reflux disease)    Heart murmur    Hyperlipidemia    Hypertension    Lumbago    Panic attacks    Stroke [facility])    Urinary incontinence    Past Surgical History:  Procedure Laterality Date   ABDOMINAL HYSTERECTOMY  1973   Partial   APPENDECTOMY  1958   BIOPSY THYROID   Dec 05, 2013   Tumor- Benign   COLECTOMY  2003   extent uncertain   EYE SURGERY Right October 10, 2014   Cataract   EYE SURGERY Left  October 17, 2014   Cataract   KNEE ARTHROSCOPY Left    MASS EXCISION Right 04/23/2018   Procedure: EXCISION OF SKIN MASS WITH PRIMARY CLOSURE;  Surgeon: Jesus Oliphant, MD;  Location: [facility];  Service: ENT;  Laterality: Right;   OVARIAN CYST SURGERY     PAROTIDECTOMY Right 04/23/2018   Procedure: Superficial Parotidectomy with nerve dissection;  Surgeon: Jesus Oliphant, MD;  Location: [facility];  Service: ENT;  Laterality: Right;   PARTIAL HYSTERECTOMY     TOTAL KNEE ARTHROPLASTY Left 09/15/2017   Procedure: TOTAL KNEE ARTHROPLASTY;  Surgeon: Liam Lerner, MD;  Location: [facility] OR;  Service: Orthopedics;  Laterality: Left;   Patient Active Problem List   Diagnosis Date Noted   TIA (transient ischemic attack) 01/26/2023   Gait abnormality 01/26/2023   Age related osteoporosis 08/13/2021   Decreased estrogen level 08/13/2021   Recurrent major depression 08/13/2021   Senile purpura 08/13/2021   Acute on chronic renal failure  10/13/2020   Aortic valve disorder 10/13/2020   Atrophy of vagina 10/13/2020   Body mass index (BMI) 32.0-32.9, adult 10/13/2020   Chronic kidney disease, stage 3 unspecified (HCC) 10/13/2020   Diabetic renal disease (HCC) 10/13/2020   Constipation 10/13/2020   Disorder of arteries and arterioles, unspecified 10/13/2020   Dysphagia 10/13/2020   Dyssomnia 10/13/2020   Essential tremor 10/13/2020   Flatulence 10/13/2020   History of hysterectomy 10/13/2020   Lichen sclerosus 10/13/2020   Major depression in partial remission 10/13/2020   Mild intermittent asthma 10/13/2020   Mild recurrent major depression 10/13/2020   Morbid obesity (HCC) 10/13/2020   Nausea and vomiting 10/13/2020   Nontoxic single thyroid  nodule 10/13/2020   Overactive bladder 10/13/2020   Primary open angle glaucoma (POAG) of both eyes, mild stage 10/13/2020   Stress incontinence (female) (female) 10/13/2020   Vitamin D deficiency 10/13/2020   Mixed incontinence 07/29/2020   Rhinorrhea 02/04/2020   Infection of skin of neck 01/02/2020   Parotid mass 04/23/2018   Parotid abscess 02/21/2018   Primary osteoarthritis of left knee 09/15/2017   Degenerative arthritis of left knee 09/12/2017   Preop cardiovascular exam 07/23/2013   Weight loss, non-intentional 07/03/2013   Aortic stenosis 05/21/2013   Occlusion and stenosis of carotid artery without mention of cerebral infarction 06/15/2012  Hypertension    Hyperlipidemia    GERD (gastroesophageal reflux disease)    Diverticulitis    Chronic pain    Lumbago    Heart murmur    Panic attacks    Stroke [facility])    Allergic rhinitis, seasonal    Stenosis of right carotid artery 11/18/2011    PCP: Verena Mems, MD  REFERRING PROVIDER: Regino Slater, MD  REFERRING DIAG: R42 (ICD-10-CM) - Vertigo  THERAPY DIAG:  No diagnosis found.  ONSET DATE: ***  Rationale for Evaluation and Treatment: Rehabilitation  SUBJECTIVE:   SUBJECTIVE STATEMENT: *** Pt  accompanied by: {accompnied:27141}  PERTINENT HISTORY: Anemia, asthma, carotid artery occlusion, DM, HLD, HTN, panic attacks, stroke, L TKA 2019  PAIN:  Are you having pain? {OPRCPAIN:27236}  PRECAUTIONS: {Therapy precautions:24002}  RED FLAGS: {PT Red Flags:29287}   WEIGHT BEARING RESTRICTIONS: {Yes ***/No:24003}  FALLS: Has patient fallen in last 6 months? {fallsyesno:27318}  LIVING ENVIRONMENT: Lives with: {OPRC lives with:25569::lives with their family} Lives in: {Lives in:25570} Stairs: {opstairs:27293} Has following equipment at home: {Assistive devices:23999}  PLOF: {PLOF:24004}  PATIENT GOALS: ***  OBJECTIVE:  Note: Objective measures were completed at Evaluation unless otherwise noted.  DIAGNOSTIC FINDINGS: none recent  COGNITION: Overall cognitive status: {cognition:24006}   SENSATION: {sensation:27233}  POSTURE:  {posture:25561}  GAIT: Gait pattern: {gait characteristics:25376} Distance walked: *** Assistive device utilized: {Assistive devices:23999} Level of assistance: {Levels of assistance:24026} Comments: ***  FUNCTIONAL TESTS:  {Functional tests:24029}  PATIENT SURVEYS:  DHI: ***  VESTIBULAR ASSESSMENT   GENERAL OBSERVATION: ***   OCULOMOTOR EXAM: {Ocular Alignment:32714:s}   Ocular ROM: {RANGE OF MOTION:21649}   Spontaneous Nystagmus: {Spontaneous nystagmus:25263}   Gaze-Induced Nystagmus: {gaze-induced nystagmus:25264}   Smooth Pursuits: {smooth pursuit:25265}   Saccades: {saccades:25266}   Convergence/Divergence: *** cm    VESTIBULAR - OCULAR REFLEX:    Slow VOR: {slow VOR:25290}   VOR Cancellation: {vor cancellation:25291}   Head-Impulse Test: {head impulse test:25272}   Dynamic Visual Acuity: {dynamic visual acuity:25273}  AUDITORY SCREEN:  Rinne Test {DESC;Negative/Positive:[id number]}    POSITIONAL TESTING:  Right Roll Test: *** Left Roll Test: ***  Right Loaded Dix-Hallpike: *** Left Loaded Dix-Hallpike:  ***  Right Sidelying: *** Left Sidelying: ***     MOTION SENSITIVITY:    Motion Sensitivity Quotient  Intensity: 0 = none, 1 = Lightheaded, 2 = Mild, 3 = Moderate, 4 = Severe, 5 = Vomiting  Intensity  1. Sitting to supine   2. Supine to L side   3. Supine to R side   4. Supine to sitting   5. L Hallpike-Dix   6. Up from L    7. R Hallpike-Dix   8. Up from R    9. Sitting, head  tipped to L knee   10. Head up from L  knee   11. Sitting, head  tipped to R knee   12. Head up from R  knee   13. Sitting head turns x5   14.Sitting head nods x5   15. In stance, 180  turn to L    16. In stance, 180  turn to R        OTHOSTATICS: {Exam; orthostatics:31331}  FUNCTIONAL GAIT: {Functional tests:24029}  TREATMENT DATE: ***   Canalith Repositioning:  {Canalith Repositioning:25283} Gaze Adaptation:  {gaze adaptation:25286} Habituation:  {habituation:25288} Other: ***  PATIENT EDUCATION: Education details: *** Person educated: {Person educated:25204} Education method: {Education Method:25205} Education comprehension: {Education Comprehension:25206}  HOME EXERCISE PROGRAM: ***  GOALS: Goals reviewed with patient? {yes/no:20286}  SHORT TERM GOALS: Target date: {follow up:25551}  Patient to be independent with initial HEP. Baseline: HEP initiated Goal status: {GOALSTATUS:25110}    LONG TERM GOALS: Target date: {follow up:25551}  Patient to be independent with advanced HEP. Baseline: Not yet initiated  Goal status: {GOALSTATUS:25110}  Patient to report 0/10 dizziness with standing vertical and horizontal VOR for 30 seconds. Baseline: Unable Goal status: {GOALSTATUS:25110}  Patient will report 0/10 dizziness with bed mobility.  Baseline: Symptomatic  Goal status: {GOALSTATUS:25110}  Patient to demonstrate *** sway with  M-CTSIB condition with eyes closed/foam surface in order to improve safety in environments with uneven surfaces and dim lighting. Baseline: *** Goal status: {GOALSTATUS:25110}  Patient to score at least 20/24 on DGI in order to decrease risk of falls. Baseline: *** Goal status: {GOALSTATUS:25110}  Patient will ambulate over outdoor surfaces with LRAD while performing head turns to scan environment with good stability in order to indicate safe community mobility. Baseline: Unable Goal status: {GOALSTATUS:25110}  Patient to score at least 18 points less on DHI in order to meet MCID and improve functional outcomes.  Baseline: *** Goal status: {GOALSTATUS:25110}   ASSESSMENT:  CLINICAL IMPRESSION:  Patient is an 83 y/o F presenting to OPPT with c/o *** for the past ***  Patient today presenting with ***.    Patient was educated on gentle *** HEP and reported understanding. Prior to current episode, patient was independent. Would benefit from skilled PT services *** x/week for *** weeks to address aforementioned impairments in order to optimize level of function.    OBJECTIVE IMPAIRMENTS: {opptimpairments:25111}.   ACTIVITY LIMITATIONS: {activitylimitations:27494}  PARTICIPATION LIMITATIONS: {participationrestrictions:25113}  PERSONAL FACTORS: {Personal factors:25162} are also affecting patient's functional outcome.   REHAB POTENTIAL: {rehabpotential:25112}  CLINICAL DECISION MAKING: {clinical decision making:25114}  EVALUATION COMPLEXITY: {Evaluation complexity:25115}   PLAN:  PT FREQUENCY: {rehab frequency:25116}  PT DURATION: {rehab duration:25117}  PLANNED INTERVENTIONS: {rehab planned interventions:25118::97110-Therapeutic exercises,97530- Therapeutic [phone]- Neuromuscular re-education,97535- Self Rjmz,02859- Manual therapy,Patient/Family education}  PLAN FOR NEXT SESSION: PIERRETTE Slater MARLA Campbell, PT 04/26/2024, 12:04 PM

## 2024-04-29 ENCOUNTER — Ambulatory Visit: Admitting: Physical Therapy
